# Patient Record
Sex: Male | Born: 1965
Health system: Southern US, Community
[De-identification: ages and names within clinical notes are randomized; demographics above are authoritative.]

## PROBLEM LIST (undated history)

## (undated) DIAGNOSIS — E785 Hyperlipidemia, unspecified: Secondary | ICD-10-CM

## (undated) DIAGNOSIS — R519 Headache, unspecified: Secondary | ICD-10-CM

## (undated) DIAGNOSIS — Z803 Family history of malignant neoplasm of breast: Secondary | ICD-10-CM

## (undated) DIAGNOSIS — Z801 Family history of malignant neoplasm of trachea, bronchus and lung: Secondary | ICD-10-CM

## (undated) DIAGNOSIS — R51 Headache: Secondary | ICD-10-CM

## (undated) DIAGNOSIS — M069 Rheumatoid arthritis, unspecified: Secondary | ICD-10-CM

## (undated) DIAGNOSIS — H539 Unspecified visual disturbance: Secondary | ICD-10-CM

## (undated) DIAGNOSIS — Z800 Family history of malignant neoplasm of digestive organs: Secondary | ICD-10-CM

## (undated) DIAGNOSIS — Z8481 Family history of carrier of genetic disease: Secondary | ICD-10-CM

## (undated) DIAGNOSIS — I10 Essential (primary) hypertension: Secondary | ICD-10-CM

## (undated) HISTORY — DX: Hyperlipidemia, unspecified: E78.5

## (undated) HISTORY — DX: Family history of malignant neoplasm of breast: Z80.3

## (undated) HISTORY — DX: Essential (primary) hypertension: I10

## (undated) HISTORY — PX: BACK SURGERY: SHX140

## (undated) HISTORY — PX: KNEE SURGERY: SHX244

## (undated) HISTORY — DX: Headache: R51

## (undated) HISTORY — DX: Family history of malignant neoplasm of trachea, bronchus and lung: Z80.1

## (undated) HISTORY — DX: Headache, unspecified: R51.9

## (undated) HISTORY — PX: CARPAL TUNNEL RELEASE: SHX101

## (undated) HISTORY — DX: Unspecified visual disturbance: H53.9

## (undated) HISTORY — DX: Family history of malignant neoplasm of digestive organs: Z80.0

## (undated) HISTORY — DX: Family history of carrier of genetic disease: Z84.81

---

## 2011-11-21 DIAGNOSIS — M549 Dorsalgia, unspecified: Secondary | ICD-10-CM

## 2011-11-21 DIAGNOSIS — E785 Hyperlipidemia, unspecified: Secondary | ICD-10-CM

## 2011-11-21 DIAGNOSIS — F411 Generalized anxiety disorder: Secondary | ICD-10-CM | POA: Insufficient documentation

## 2011-11-21 DIAGNOSIS — M109 Gout, unspecified: Secondary | ICD-10-CM

## 2011-11-21 DIAGNOSIS — G8929 Other chronic pain: Secondary | ICD-10-CM | POA: Insufficient documentation

## 2011-11-21 HISTORY — DX: Gout, unspecified: M10.9

## 2011-11-21 HISTORY — DX: Hyperlipidemia, unspecified: E78.5

## 2011-11-21 HISTORY — DX: Generalized anxiety disorder: F41.1

## 2011-11-21 HISTORY — DX: Dorsalgia, unspecified: M54.9

## 2013-04-19 DIAGNOSIS — N529 Male erectile dysfunction, unspecified: Secondary | ICD-10-CM

## 2013-04-19 HISTORY — DX: Male erectile dysfunction, unspecified: N52.9

## 2013-09-19 DIAGNOSIS — J45909 Unspecified asthma, uncomplicated: Secondary | ICD-10-CM

## 2013-09-19 HISTORY — DX: Unspecified asthma, uncomplicated: J45.909

## 2014-03-04 DIAGNOSIS — K219 Gastro-esophageal reflux disease without esophagitis: Secondary | ICD-10-CM

## 2014-03-04 DIAGNOSIS — F329 Major depressive disorder, single episode, unspecified: Secondary | ICD-10-CM | POA: Insufficient documentation

## 2014-03-04 HISTORY — DX: Gastro-esophageal reflux disease without esophagitis: K21.9

## 2014-03-04 HISTORY — DX: Major depressive disorder, single episode, unspecified: F32.9

## 2015-07-30 DIAGNOSIS — M1712 Unilateral primary osteoarthritis, left knee: Secondary | ICD-10-CM

## 2015-07-30 HISTORY — DX: Unilateral primary osteoarthritis, left knee: M17.12

## 2015-12-24 ENCOUNTER — Telehealth: Payer: Self-pay | Admitting: Behavioral Health

## 2015-12-24 NOTE — Telephone Encounter (Signed)
Attempted to reach patient at time of Pre-Visit Call. Unable to leave a message due to the patient's voice mailbox being full.

## 2015-12-25 ENCOUNTER — Ambulatory Visit (INDEPENDENT_AMBULATORY_CARE_PROVIDER_SITE_OTHER): Payer: BLUE CROSS/BLUE SHIELD | Admitting: Family Medicine

## 2015-12-25 ENCOUNTER — Encounter: Payer: Self-pay | Admitting: Family Medicine

## 2015-12-25 VITALS — BP 136/70 | HR 82 | Temp 98.2°F | Ht 67.0 in | Wt 174.0 lb

## 2015-12-25 DIAGNOSIS — Z1211 Encounter for screening for malignant neoplasm of colon: Secondary | ICD-10-CM | POA: Diagnosis not present

## 2015-12-25 DIAGNOSIS — G4700 Insomnia, unspecified: Secondary | ICD-10-CM

## 2015-12-25 DIAGNOSIS — E785 Hyperlipidemia, unspecified: Secondary | ICD-10-CM | POA: Diagnosis not present

## 2015-12-25 DIAGNOSIS — Z130 Encounter for screening for diseases of the blood and blood-forming organs and certain disorders involving the immune mechanism: Secondary | ICD-10-CM

## 2015-12-25 DIAGNOSIS — Z7189 Other specified counseling: Secondary | ICD-10-CM

## 2015-12-25 DIAGNOSIS — F17200 Nicotine dependence, unspecified, uncomplicated: Secondary | ICD-10-CM

## 2015-12-25 DIAGNOSIS — Z7689 Persons encountering health services in other specified circumstances: Secondary | ICD-10-CM

## 2015-12-25 DIAGNOSIS — Z131 Encounter for screening for diabetes mellitus: Secondary | ICD-10-CM | POA: Diagnosis not present

## 2015-12-25 HISTORY — DX: Insomnia, unspecified: G47.00

## 2015-12-25 HISTORY — DX: Hyperlipidemia, unspecified: E78.5

## 2015-12-25 HISTORY — DX: Nicotine dependence, unspecified, uncomplicated: F17.200

## 2015-12-25 LAB — COMPREHENSIVE METABOLIC PANEL
ALK PHOS: 123 U/L — AB (ref 39–117)
ALT: 20 U/L (ref 0–53)
AST: 17 U/L (ref 0–37)
Albumin: 4.3 g/dL (ref 3.5–5.2)
BILIRUBIN TOTAL: 0.2 mg/dL (ref 0.2–1.2)
BUN: 10 mg/dL (ref 6–23)
CO2: 25 meq/L (ref 19–32)
Calcium: 9 mg/dL (ref 8.4–10.5)
Chloride: 106 mEq/L (ref 96–112)
Creatinine, Ser: 1.07 mg/dL (ref 0.40–1.50)
GFR: 77.49 mL/min (ref >60.00)
GLUCOSE: 117 mg/dL — AB (ref 70–99)
Potassium: 4.4 mEq/L (ref 3.5–5.1)
Sodium: 137 mEq/L (ref 135–145)
TOTAL PROTEIN: 6.6 g/dL (ref 6.0–8.3)

## 2015-12-25 LAB — CBC
HCT: 38.9 % — ABNORMAL LOW (ref 39.0–52.0)
Hemoglobin: 13.2 g/dL (ref 13.0–17.0)
MCHC: 33.8 g/dL (ref 30.0–36.0)
MCV: 89.4 fl (ref 78.0–100.0)
Platelets: 244 10*3/uL (ref 150.0–400.0)
RBC: 4.35 Mil/uL (ref 4.22–5.81)
RDW: 13.4 % (ref 11.5–15.5)
WBC: 9.6 10*3/uL (ref 4.0–10.5)

## 2015-12-25 LAB — LIPID PANEL
CHOL/HDL RATIO: 4
CHOLESTEROL: 125 mg/dL (ref 0–200)
HDL: 32.3 mg/dL — ABNORMAL LOW (ref >39.00)
LDL CALC: 57 mg/dL (ref 0–99)
NONHDL: 92.93
Triglycerides: 181 mg/dL — ABNORMAL HIGH (ref 0.0–149.0)
VLDL: 36.2 mg/dL (ref 0.0–40.0)

## 2015-12-25 LAB — HEMOGLOBIN A1C: HEMOGLOBIN A1C: 5.3 % (ref 4.6–6.5)

## 2015-12-25 NOTE — Progress Notes (Signed)
Pre visit review using our clinic review tool, if applicable. No additional management support is needed unless otherwise documented below in the visit note. 

## 2015-12-25 NOTE — Patient Instructions (Signed)
I will be in touch with your labs asap If you like, set up your mychart account- this is the best way to communicate with me! Please keep an eye on your blood pressure- if you are consistently running higher than 135/90 please send me a message  I will refer you to the GI group in [place]  Please come and see me for a physical in the fall- Let me know if you need any refills in the meantime Please do work on cutting down/ quitting smoking

## 2015-12-25 NOTE — Progress Notes (Signed)
[facility] at [facility] [address], [address] [place], [place] [address] [phone]  Date:  12/25/2015   Name:  Timothy Mcintosh   DOB:  [birth year]   MRN:  [id number]  PCP:  Abbe Amsterdam, MD    Chief Complaint: New Patient (Initial Visit)   History of Present Illness:  Timothy Mcintosh is a 50 y.o. very pleasant male patient who presents with the following:  Here today as a new patient to establish care and see [place] for   He has llst 65 lbs over the last year- he has been exercising and changed his diet.  He has walked a lot on the treadmill  He has not had his BP medication in a couple of weeks and wonders if he still needs it.  He is taking much better care of himself  He was taking lisinpril 40 mg daily.  He does check his BP at the drug stores- about once a week.    He is a smoker still His MGF did have an MI at age 68.  HTN runs in his family as well as dyslipidemia.   His mother had breast cancer.   He did do a stress test 5-6 years ago.   He does not have any CP when he is exercisng He does have some OA.    He has not yet had a colonoscopy- he is willing to get this scheduled  He is due for labs today He declines a flu shot today  He does take trazodone at night- 100 mg- this is for insomnia There are no active problems to display for this patient.   No past medical history on file.  No past surgical history on file.  Social History  Substance Use Topics  . Smoking status: Current Every Day Smoker  . Smokeless tobacco: None  . Alcohol Use: No    No family history on file.  Not on File  Medication list has been reviewed and updated.  No current outpatient prescriptions on file prior to visit.   No current facility-administered medications on file prior to visit.    Review of Systems:  As per HPI- otherwise negative.   Physical Examination: Filed Vitals:   12/25/15 0830  BP: 136/70  Pulse: 82   Temp: 98.2 F (36.8 C)   Filed Vitals:   12/25/15 0830  Height: 5\' 7"  (1.702 m)  Weight: 174 lb (78.926 kg)   Body mass index is 27.25 kg/(m^2). Ideal Body Weight: Weight in (lb) to have BMI = 25: 159.3  GEN: WDWN, NAD, Non-toxic, A & O x 3, looks well HEENT: Atraumatic, Normocephalic. Neck supple. No masses, No LAD. Ears and Nose: No external deformity. CV: RRR, No M/G/R. No JVD. No thrill. No extra heart sounds. PULM: CTA B, no wheezes, crackles, rhonchi. No retractions. No resp. distress. No accessory muscle use. EXTR: No c/c/e NEURO Normal gait.  PSYCH: Normally interactive. Conversant. Not depressed or anxious appearing.  Calm demeanor.    Assessment and Plan: Establishing care with new doctor, encounter for  Dyslipidemia - Plan: Lipid panel  Screening for colon cancer - Plan: Ambulatory referral to Gastroenterology  Screening for deficiency anemia - Plan: CBC  Screening for diabetes mellitus - Plan: Comprehensive metabolic panel, Hemoglobin A1c  Tobacco use disorder  Insomnia  Establishing with as PCP today He is still taking his CHL medication but no longer taking BP medication His BP is quite reasonable off meds so  he may no longer need this He will watch his BP and let me know if running high- in that case will call in a lower lose of lisinopril for him Await labs Referral to GI  Signed Abbe Amsterdam, MD

## 2016-01-20 ENCOUNTER — Encounter: Payer: Self-pay | Admitting: Family Medicine

## 2016-01-21 MED ORDER — SIMVASTATIN 40 MG PO TABS: 40.0000 mg | ORAL_TABLET | Freq: Every day | ORAL | Status: DC

## 2016-02-04 DIAGNOSIS — Z1211 Encounter for screening for malignant neoplasm of colon: Secondary | ICD-10-CM | POA: Diagnosis not present

## 2016-02-04 DIAGNOSIS — Z8371 Family history of colonic polyps: Secondary | ICD-10-CM | POA: Diagnosis not present

## 2016-02-23 ENCOUNTER — Encounter: Payer: Self-pay | Admitting: Family Medicine

## 2016-02-24 MED ORDER — ALLOPURINOL 300 MG PO TABS: 300.0000 mg | ORAL_TABLET | Freq: Every day | ORAL | Status: DC

## 2016-02-24 MED ORDER — OMEPRAZOLE 40 MG PO CPDR: 40.0000 mg | DELAYED_RELEASE_CAPSULE | Freq: Every day | ORAL | Status: DC

## 2016-03-04 ENCOUNTER — Encounter: Payer: Self-pay | Admitting: Family Medicine

## 2016-03-04 ENCOUNTER — Ambulatory Visit (INDEPENDENT_AMBULATORY_CARE_PROVIDER_SITE_OTHER): Payer: BLUE CROSS/BLUE SHIELD | Admitting: Family Medicine

## 2016-03-04 ENCOUNTER — Telehealth: Payer: Self-pay

## 2016-03-04 ENCOUNTER — Ambulatory Visit (HOSPITAL_BASED_OUTPATIENT_CLINIC_OR_DEPARTMENT_OTHER)
Admission: RE | Admit: 2016-03-04 | Discharge: 2016-03-04 | Disposition: A | Discharge status: Home / Self Care | Payer: BLUE CROSS/BLUE SHIELD | Source: Ambulatory Visit | Attending: Family Medicine | Admitting: Family Medicine

## 2016-03-04 VITALS — BP 132/82 | HR 75 | Temp 98.6°F | Ht 67.5 in | Wt 177.0 lb

## 2016-03-04 DIAGNOSIS — M25561 Pain in right knee: Secondary | ICD-10-CM | POA: Insufficient documentation

## 2016-03-04 DIAGNOSIS — M2550 Pain in unspecified joint: Secondary | ICD-10-CM | POA: Diagnosis not present

## 2016-03-04 DIAGNOSIS — M25562 Pain in left knee: Secondary | ICD-10-CM

## 2016-03-04 DIAGNOSIS — M179 Osteoarthritis of knee, unspecified: Secondary | ICD-10-CM | POA: Diagnosis not present

## 2016-03-04 MED ORDER — TRAMADOL HCL 50 MG PO TABS: 50.0000 mg | ORAL_TABLET | Freq: Three times a day (TID) | ORAL | Status: DC | PRN

## 2016-03-04 NOTE — Progress Notes (Signed)
Pre visit review using our clinic tool,if applicable. No additional management support is needed unless otherwise documented below in the visit note.  

## 2016-03-04 NOTE — Patient Instructions (Signed)
Please go downstairs for x-rays.  You can then go home and I will be in touch with your results asap Please schedule a lab visit at your convenience to have some blood drawn in the next couple of weeks Try the tramadol as needed for pain- remember this can make you feel drowsy.  Combination with your trazodone may cause you to feel more sleepy- at first try taking just 1/2 of your trazodone until you see how it effects ytou

## 2016-03-04 NOTE — Progress Notes (Signed)
[name] at [facility] [address], [place], [place] [address] [phone]  Date:  03/04/2016   Name:  Timothy Mcintosh   DOB:  December 08, 1965   MRN:  [id number]  PCP:  Lamar Blinks, MD    Chief Complaint: Joint Pain   History of Present Illness:  Timothy Mcintosh is a 49 y.o. very pleasant male patient who presents with the following:  Seen by myself a little over than 2 months ago to establish care.  He mentioned mild OA at that visit.  Here today with complaint of worsening joint pains. He notes more pain in his shoulders, elbows, ankles and knees. They have been hurting more over the last several weeks. As long as he is busy during the day he does ok, but he has pain when he stops moving around at night.  He will actually feel like his bones are tender to touch.  He is taking diclofenac right now- however he is not sure how much it is helping.  When he ran out he did not notice much difference His sx got worse gradually- seemed to start around some weather changes so he did not think too much of it at first.   He is even waking up at night with the pain- his shoulders (both) will wake him up No fever, sweats, weight loss.   He did have a right knee scope in the past.   There is a family history of arthritis- OA and psoriatic arthritis.  No known family history of RA  Patient Active Problem List   Diagnosis Date Noted  . Dyslipidemia 12/25/2015  . Tobacco use disorder 12/25/2015  . Insomnia 12/25/2015    Past Medical History  Diagnosis Date  . Hyperlipidemia   . Hypertension     Past Surgical History  Procedure Laterality Date  . Back surgery Bilateral     c5 -c6  . Carpal tunnel release Right     Social History  Substance Use Topics  . Smoking status: Current Every Day Smoker  . Smokeless tobacco: None  . Alcohol Use: No    No family history on file.  No Known Allergies  Medication list has been reviewed and  updated.  Current Outpatient Prescriptions on File Prior to Visit  Medication Sig Dispense Refill  . allopurinol (ZYLOPRIM) 300 MG tablet Take 1 tablet (300 mg total) by mouth daily. 30 tablet 5  . Multiple Vitamin (MULTIVITAMIN) capsule Take by mouth.    Marland Kitchen omeprazole (PRILOSEC) 40 MG capsule Take 1 capsule (40 mg total) by mouth daily. 30 capsule 5  . Probiotic Product (ACIDOPHILUS/GOAT MILK) CAPS Take by mouth.    . simvastatin (ZOCOR) 40 MG tablet Take 1 tablet (40 mg total) by mouth daily. 90 tablet 3  . traZODone (DESYREL) 100 MG tablet      No current facility-administered medications on file prior to visit.    Review of Systems:  As per HPI- otherwise negative.   Physical Examination: Filed Vitals:   03/04/16 1709  BP: 132/82  Pulse: 75  Temp: 98.6 F (37 C)   Filed Vitals:   03/04/16 1709  Height: 5' 7.5" (1.715 m)  Weight: 177 lb (80.287 kg)   Body mass index is 27.3 kg/(m^2). Ideal Body Weight: Weight in (lb) to have BMI = 25: 161.7  GEN: WDWN, NAD, Non-toxic, A & O x 3.  Looks well, smoker HEENT: Atraumatic, Normocephalic. Neck supple. No masses, No LAD. Ears  and Nose: No external deformity. CV: RRR, No M/G/R. No JVD. No thrill. No extra heart sounds. PULM: CTA B, no wheezes, crackles, rhonchi. No retractions. No resp. distress. No accessory muscle use. ABD: S, NT, ND, +BS. No rebound. No HSM. EXTR: No c/c/e NEURO Normal gait.  PSYCH: Normally interactive. Conversant. Not depressed or anxious appearing.  Calm demeanor.  Full and normal ROM of the bilateral shoulders, elbows, knees and ankles. Some crepitus in the left ankle   Assessment and Plan: Joint pain - Plan: traMADol (ULTRAM) 50 MG tablet, Cyclic citrul peptide antibody, IgG, Hepatic function panel, Sed Rate (ESR), C-reactive protein, Rheumatoid Factor  Left knee pain - Plan: DG Knee 1-2 Views Left  Right knee pain - Plan: DG Knee 1-2 Views Right  Here today with worsening joint pains Noted  slightly elevated alk phos from last labs X-rays pending of knees He will come in for labs tomorrow May try tramadol for pain as needed- cautioned regarding sedation  Signed Lamar Blinks, MD

## 2016-03-04 NOTE — Telephone Encounter (Signed)
Called medication in to Pharmacy. Tramadol # 40 with ) refills.Marland Kitchen

## 2016-03-05 ENCOUNTER — Other Ambulatory Visit (INDEPENDENT_AMBULATORY_CARE_PROVIDER_SITE_OTHER): Payer: BLUE CROSS/BLUE SHIELD

## 2016-03-05 DIAGNOSIS — M2550 Pain in unspecified joint: Secondary | ICD-10-CM

## 2016-03-05 LAB — C-REACTIVE PROTEIN: CRP: 0.4 mg/dL — AB (ref 0.5–20.0)

## 2016-03-05 LAB — HEPATIC FUNCTION PANEL
ALBUMIN: 4.3 g/dL (ref 3.5–5.2)
ALT: 18 U/L (ref 0–53)
AST: 16 U/L (ref 0–37)
Alkaline Phosphatase: 110 U/L (ref 39–117)
Bilirubin, Direct: 0 mg/dL (ref 0.0–0.3)
Total Bilirubin: 0.3 mg/dL (ref 0.2–1.2)
Total Protein: 6.3 g/dL (ref 6.0–8.3)

## 2016-03-05 LAB — SEDIMENTATION RATE: SED RATE: 4 mm/h (ref 0–22)

## 2016-03-05 LAB — RHEUMATOID FACTOR: Rhuematoid fact SerPl-aCnc: <10 IU/mL (ref <=14)

## 2016-03-08 ENCOUNTER — Ambulatory Visit: Payer: Self-pay | Admitting: Family Medicine

## 2016-03-08 ENCOUNTER — Other Ambulatory Visit: Payer: Self-pay | Admitting: Family Medicine

## 2016-03-08 DIAGNOSIS — M2550 Pain in unspecified joint: Secondary | ICD-10-CM

## 2016-03-08 LAB — CYCLIC CITRUL PEPTIDE ANTIBODY, IGG: CYCLIC CITRULLIN PEPTIDE AB: 57 U — AB

## 2016-03-25 DIAGNOSIS — M2550 Pain in unspecified joint: Secondary | ICD-10-CM | POA: Diagnosis not present

## 2016-03-25 DIAGNOSIS — R5382 Chronic fatigue, unspecified: Secondary | ICD-10-CM | POA: Diagnosis not present

## 2016-04-01 ENCOUNTER — Other Ambulatory Visit: Payer: Self-pay | Admitting: Family Medicine

## 2016-04-01 ENCOUNTER — Encounter: Payer: Self-pay | Admitting: Family Medicine

## 2016-04-01 DIAGNOSIS — M069 Rheumatoid arthritis, unspecified: Secondary | ICD-10-CM | POA: Insufficient documentation

## 2016-04-01 DIAGNOSIS — I10 Essential (primary) hypertension: Secondary | ICD-10-CM

## 2016-04-01 MED ORDER — LISINOPRIL 20 MG PO TABS: ORAL_TABLET | ORAL | Status: DC

## 2016-05-05 DIAGNOSIS — R5382 Chronic fatigue, unspecified: Secondary | ICD-10-CM | POA: Diagnosis not present

## 2016-05-05 DIAGNOSIS — M0579 Rheumatoid arthritis with rheumatoid factor of multiple sites without organ or systems involvement: Secondary | ICD-10-CM | POA: Diagnosis not present

## 2016-05-05 DIAGNOSIS — M2550 Pain in unspecified joint: Secondary | ICD-10-CM | POA: Diagnosis not present

## 2016-05-21 ENCOUNTER — Encounter: Payer: Self-pay | Admitting: Family Medicine

## 2016-05-21 ENCOUNTER — Other Ambulatory Visit: Payer: Self-pay | Admitting: Emergency Medicine

## 2016-05-21 DIAGNOSIS — I10 Essential (primary) hypertension: Secondary | ICD-10-CM

## 2016-05-21 MED ORDER — LISINOPRIL 20 MG PO TABS: ORAL_TABLET | ORAL | Status: DC

## 2016-05-21 MED ORDER — ALLOPURINOL 300 MG PO TABS: 300.0000 mg | ORAL_TABLET | Freq: Every day | ORAL | Status: DC

## 2016-05-21 NOTE — Telephone Encounter (Signed)
Spoke to pt and he states that his rheumatologist gave him the folic acid. He will contact them for the folic acid or get it otc. I will send refills in for Allopurinol and Lisinopril. Pt's wife will come by the office to pick up syringes to last pt until his next refill.

## 2016-05-21 NOTE — Telephone Encounter (Signed)
Received refill request from pt for Allopurinol, lisinopril and folic acid. Folic acid is not currently on pt's med list. Is it ok to order folic acid and if so what dosage or should the pt just purchase the folic acid otc. Thanks.

## 2016-06-15 ENCOUNTER — Encounter: Payer: Self-pay | Admitting: Family Medicine

## 2016-06-16 MED ORDER — TRAZODONE HCL 100 MG PO TABS: 100.0000 mg | ORAL_TABLET | Freq: Every evening | ORAL | 3 refills | Status: DC | PRN

## 2016-06-21 DIAGNOSIS — R5382 Chronic fatigue, unspecified: Secondary | ICD-10-CM | POA: Diagnosis not present

## 2016-06-21 DIAGNOSIS — M0579 Rheumatoid arthritis with rheumatoid factor of multiple sites without organ or systems involvement: Secondary | ICD-10-CM | POA: Diagnosis not present

## 2016-06-21 DIAGNOSIS — M2550 Pain in unspecified joint: Secondary | ICD-10-CM | POA: Diagnosis not present

## 2016-06-23 ENCOUNTER — Ambulatory Visit (INDEPENDENT_AMBULATORY_CARE_PROVIDER_SITE_OTHER): Payer: BLUE CROSS/BLUE SHIELD | Admitting: Family Medicine

## 2016-06-23 ENCOUNTER — Encounter: Payer: Self-pay | Admitting: Family Medicine

## 2016-06-23 VITALS — BP 145/70 | HR 98 | Temp 98.7°F | Wt 172.0 lb

## 2016-06-23 DIAGNOSIS — F17200 Nicotine dependence, unspecified, uncomplicated: Secondary | ICD-10-CM | POA: Diagnosis not present

## 2016-06-23 DIAGNOSIS — I10 Essential (primary) hypertension: Secondary | ICD-10-CM

## 2016-06-23 DIAGNOSIS — J209 Acute bronchitis, unspecified: Secondary | ICD-10-CM

## 2016-06-23 DIAGNOSIS — M069 Rheumatoid arthritis, unspecified: Secondary | ICD-10-CM

## 2016-06-23 MED ORDER — CEFDINIR 300 MG PO CAPS: 300.0000 mg | ORAL_CAPSULE | Freq: Two times a day (BID) | ORAL | 0 refills | Status: DC

## 2016-06-23 MED ORDER — VARENICLINE TARTRATE 1 MG PO TABS: 1.0000 mg | ORAL_TABLET | Freq: Two times a day (BID) | ORAL | 3 refills | Status: DC

## 2016-06-23 MED ORDER — VARENICLINE TARTRATE 0.5 MG X 11 & 1 MG X 42 PO MISC: ORAL | 0 refills | Status: DC

## 2016-06-23 NOTE — Patient Instructions (Signed)
It was very nice to see you today!   Your BP looked better on recheck- continue to keep an eye on it, but it may be a little bit high today due to prednisone Use the chantix as directed; there is a website with good info and maybe a savings card for you.  Choose your quit date, and start the medication a week prior to that date  Use the omnicef antibiotic for your cough- take it twice daily for 10 days.    Check with your rheumatologist about getting a flu shot this year

## 2016-06-23 NOTE — Progress Notes (Signed)
[facility] at [facility] [address] [place], [place] [address] [phone]  Date:  06/23/2016   Name:  Timothy Mcintosh   DOB:  11-Nov-1965   MRN:  [id number]  PCP:  Abbe Amsterdam, MD    Chief Complaint: Cough (Several weeks of coughing, Wants to quit smoking)   History of Present Illness:  Timothy Mcintosh is a 50 y.o. very pleasant male patient who presents with the following:  Here today for a recheck - we started on BP medication in the spring.  He is taking lisinopril without any SE, he is monitoring his BP at home He is here today for a cough that has been persistent for about 4-5 weeks.  The cough can be productive; he is bringing up some dark phlegm.  Not feeling SOB.  He has not noted any wheezing, no fever.    He would like to try quit smoking.  In the past he has tried wellbutrin- it did work well for him, he had much decreased cravings. However after a couple of weeks it made it hard for him to sleep, he already has trouble sleeping. He stopped using wellbutrin at that time but would be willing to try this or another med to quit  He has been smoking for about 30 years, about 3/4 ppd.   We discussed various methods of quitting including cutting down, cold [place], medications,  He is most interested in using chantix. Discussed how to pick a quit date and use the starter and continuation packs  He does have recently dx RA; he is seeing Dr. Dierdre Forth and Marcelino Duster, one of his PAs.  Currently on methotrexate and they are adding Hurima soon.  They did TB testing for him and results are pending He is also on prednisone right now.  He is using a 4 weeks taper , currently on 20 mg of pred. This is for a recent flare of his RA symptoms.    BP Readings from Last 3 Encounters:  06/23/16 (!) 157/49  03/04/16 132/82  12/25/15 136/70   His home BP was 118/68 at home last weekend. He is taking 20 mg of lisinopril right now.  He has been on  this for a few months so we do not think this is the cause of his cough  He does use trazodone for sleep; he is sometimes up with his RA pain.    Patient Active Problem List   Diagnosis Date Noted  . Rheumatoid arthritis (HCC) 04/01/2016  . Dyslipidemia 12/25/2015  . Tobacco use disorder 12/25/2015  . Insomnia 12/25/2015    Past Medical History:  Diagnosis Date  . Hyperlipidemia   . Hypertension     Past Surgical History:  Procedure Laterality Date  . BACK SURGERY Bilateral    c5 -c6  . CARPAL TUNNEL RELEASE Right     Social History  Substance Use Topics  . Smoking status: Current Every Day Smoker  . Smokeless tobacco: Not on file  . Alcohol use No    No family history on file.  No Known Allergies  Medication list has been reviewed and updated.  Current Outpatient Prescriptions on File Prior to Visit  Medication Sig Dispense Refill  . allopurinol (ZYLOPRIM) 300 MG tablet Take 1 tablet (300 mg total) by mouth daily. 30 tablet 5  . lisinopril (PRINIVIL,ZESTRIL) 20 MG tablet Start with a 1/2 tablet and increase to a whole tablet if needed 30 tablet 6  .  Multiple Vitamin (MULTIVITAMIN) capsule Take by mouth.    Marland Kitchen omeprazole (PRILOSEC) 40 MG capsule Take 1 capsule (40 mg total) by mouth daily. 30 capsule 5  . Probiotic Product (ACIDOPHILUS/GOAT MILK) CAPS Take by mouth.    . simvastatin (ZOCOR) 40 MG tablet Take 1 tablet (40 mg total) by mouth daily. 90 tablet 3  . traMADol (ULTRAM) 50 MG tablet Take 1 tablet (50 mg total) by mouth every 8 (eight) hours as needed. 40 tablet 0  . traZODone (DESYREL) 100 MG tablet Take 1 tablet (100 mg total) by mouth at bedtime as needed for sleep. 90 tablet 3   No current facility-administered medications on file prior to visit.     Review of Systems:  As per HPI- otherwise negative.   Physical Examination: Vitals:   06/23/16 0853  BP: (!) 157/49  Pulse: 98  Temp: 98.7 F (37.1 C)   Vitals:   06/23/16 0853  Weight: 172 lb  (78 kg)   Body mass index is 26.54 kg/m. Ideal Body Weight:    GEN: WDWN, NAD, Non-toxic, A & O x 3, looks well, normal weight HEENT: Atraumatic, Normocephalic. Neck supple. No masses, No LAD.  Bilateral TM wnl, oropharynx normal.  PEERL,EOMI.   Ears and Nose: No external deformity. CV: RRR, No M/G/R. No JVD. No thrill. No extra heart sounds. PULM: CTA B, no wheezes, crackles, rhonchi. No retractions. No resp. distress. No accessory muscle use. ABD: S, NT, ND EXTR: No c/c/e NEURO Normal gait.  PSYCH: Normally interactive. Conversant. Not depressed or anxious appearing.  Calm demeanor.    Assessment and Plan: Acute bronchitis, unspecified organism - Plan: cefdinir (OMNICEF) 300 MG capsule  Tobacco use disorder - Plan: varenicline (CHANTIX STARTING MONTH PAK) 0.5 MG X 11 & 1 MG X 42 tablet, varenicline (CHANTIX CONTINUING MONTH PAK) 1 MG tablet  Rheumatoid arthritis flare (HCC)  Essential hypertension chose omnicef as it does not interact with methotrexate. He will let me know if cough does not resolve He will continue to monitor his BP at home Start chatix as directed  It was very nice to see you today!   Your BP looked better on recheck- continue to keep an eye on it, but it may be a little bit high today due to prednisone Use the chantix as directed; there is a website with good info and maybe a savings card for you.  Choose your quit date, and start the medication a week prior to that date  Use the omnicef antibiotic for your cough- take it twice daily for 10 days.    Check with your rheumatologist about getting a flu shot this year   Signed Abbe Amsterdam, MD

## 2016-06-23 NOTE — Progress Notes (Signed)
Pre visit review using our clinic review tool, if applicable. No additional management support is needed unless otherwise documented below in the visit note. 

## 2016-08-20 ENCOUNTER — Other Ambulatory Visit: Payer: Self-pay | Admitting: Family Medicine

## 2016-08-20 DIAGNOSIS — I10 Essential (primary) hypertension: Secondary | ICD-10-CM

## 2016-08-23 DIAGNOSIS — M2550 Pain in unspecified joint: Secondary | ICD-10-CM | POA: Diagnosis not present

## 2016-08-23 DIAGNOSIS — M1A09X0 Idiopathic chronic gout, multiple sites, without tophus (tophi): Secondary | ICD-10-CM | POA: Diagnosis not present

## 2016-08-23 DIAGNOSIS — M0579 Rheumatoid arthritis with rheumatoid factor of multiple sites without organ or systems involvement: Secondary | ICD-10-CM | POA: Diagnosis not present

## 2016-08-30 MED ORDER — LISINOPRIL 20 MG PO TABS: ORAL_TABLET | ORAL | 6 refills | Status: DC

## 2016-08-30 NOTE — Telephone Encounter (Signed)
Called him back- he has noted his BP running high for the last couple of weeks He took chantix and also had recently used a course of pred BP last week at MD office- rheum- 167/98 No CP or SOB He is taking 2 mg of lisinopril. Will increase to 30 mg, he can go to 40 if necessary. He will continue to monitor his BP and will keep me posted   BP Readings from Last 3 Encounters:  06/23/16 (!) 145/70  03/04/16 132/82  12/25/15 136/70

## 2016-08-30 NOTE — Telephone Encounter (Signed)
-----   Message from [place], [place] sent at 08/30/2016 11:34 AM EDT ----- Regarding: Bllod pressure concerns Patient taking Lisinopril 20mg  po qd. Pt states his pressure has been up 160/100 today and has been as high as 180/100. Would like a phone call [phone]

## 2016-09-03 ENCOUNTER — Encounter: Payer: Self-pay | Admitting: Medical

## 2016-09-03 ENCOUNTER — Telehealth: Payer: Self-pay | Admitting: *Deleted

## 2016-09-03 ENCOUNTER — Observation Stay (HOSPITAL_BASED_OUTPATIENT_CLINIC_OR_DEPARTMENT_OTHER)
Admission: EM | Admit: 2016-09-03 | Discharge: 2016-09-04 | Disposition: A | Discharge status: Home / Self Care | Payer: BLUE CROSS/BLUE SHIELD | Attending: Internal Medicine | Admitting: Internal Medicine

## 2016-09-03 ENCOUNTER — Emergency Department (HOSPITAL_BASED_OUTPATIENT_CLINIC_OR_DEPARTMENT_OTHER): Payer: BLUE CROSS/BLUE SHIELD

## 2016-09-03 ENCOUNTER — Encounter (HOSPITAL_BASED_OUTPATIENT_CLINIC_OR_DEPARTMENT_OTHER): Payer: Self-pay | Admitting: *Deleted

## 2016-09-03 ENCOUNTER — Ambulatory Visit (INDEPENDENT_AMBULATORY_CARE_PROVIDER_SITE_OTHER): Payer: BLUE CROSS/BLUE SHIELD | Admitting: Medical

## 2016-09-03 VITALS — BP 174/90 | HR 87 | Temp 98.1°F | Ht 67.5 in | Wt 171.8 lb

## 2016-09-03 DIAGNOSIS — L749 Eccrine sweat disorder, unspecified: Secondary | ICD-10-CM

## 2016-09-03 DIAGNOSIS — Z8249 Family history of ischemic heart disease and other diseases of the circulatory system: Secondary | ICD-10-CM

## 2016-09-03 DIAGNOSIS — R0789 Other chest pain: Principal | ICD-10-CM | POA: Insufficient documentation

## 2016-09-03 DIAGNOSIS — M069 Rheumatoid arthritis, unspecified: Secondary | ICD-10-CM | POA: Diagnosis not present

## 2016-09-03 DIAGNOSIS — Z87891 Personal history of nicotine dependence: Secondary | ICD-10-CM

## 2016-09-03 DIAGNOSIS — G8929 Other chronic pain: Secondary | ICD-10-CM

## 2016-09-03 DIAGNOSIS — I10 Essential (primary) hypertension: Secondary | ICD-10-CM | POA: Diagnosis not present

## 2016-09-03 DIAGNOSIS — M546 Pain in thoracic spine: Secondary | ICD-10-CM

## 2016-09-03 DIAGNOSIS — F17210 Nicotine dependence, cigarettes, uncomplicated: Secondary | ICD-10-CM | POA: Diagnosis not present

## 2016-09-03 DIAGNOSIS — E785 Hyperlipidemia, unspecified: Secondary | ICD-10-CM | POA: Insufficient documentation

## 2016-09-03 DIAGNOSIS — R079 Chest pain, unspecified: Secondary | ICD-10-CM | POA: Diagnosis not present

## 2016-09-03 DIAGNOSIS — R05 Cough: Secondary | ICD-10-CM | POA: Diagnosis not present

## 2016-09-03 LAB — BASIC METABOLIC PANEL
Anion gap: 8 (ref 5–15)
BUN: 18 mg/dL (ref 6–20)
CHLORIDE: 106 mmol/L (ref 101–111)
CO2: 25 mmol/L (ref 22–32)
CREATININE: 0.76 mg/dL (ref 0.61–1.24)
Calcium: 9.6 mg/dL (ref 8.9–10.3)
GFR calc Af Amer: >60 mL/min (ref >60)
GFR calc non Af Amer: >60 mL/min (ref >60)
Glucose, Bld: 101 mg/dL — ABNORMAL HIGH (ref 65–99)
Potassium: 3.7 mmol/L (ref 3.5–5.1)
SODIUM: 139 mmol/L (ref 135–145)

## 2016-09-03 LAB — CBC
HCT: 43.9 % (ref 39.0–52.0)
Hemoglobin: 15 g/dL (ref 13.0–17.0)
MCH: 32.2 pg (ref 26.0–34.0)
MCHC: 34.2 g/dL (ref 30.0–36.0)
MCV: 94.2 fL (ref 78.0–100.0)
PLATELETS: 346 10*3/uL (ref 150–400)
RBC: 4.66 MIL/uL (ref 4.22–5.81)
RDW: 13.3 % (ref 11.5–15.5)
WBC: 12.6 10*3/uL — AB (ref 4.0–10.5)

## 2016-09-03 LAB — TROPONIN I
Troponin I: <0.03 ng/mL (ref <0.03)
Troponin I: <0.03 ng/mL (ref <0.03)
Troponin I: <0.03 ng/mL (ref <0.03)

## 2016-09-03 MED ORDER — ENOXAPARIN SODIUM 40 MG/0.4ML ~~LOC~~ SOLN
40.0000 mg | SUBCUTANEOUS | Status: DC
  Administered 2016-09-03: 40 mg via SUBCUTANEOUS
  Filled 2016-09-03: qty 0.4

## 2016-09-03 MED ORDER — NITROGLYCERIN 0.4 MG SL SUBL
0.4000 mg | SUBLINGUAL_TABLET | SUBLINGUAL | Status: DC | PRN
Start: 2016-09-03 — End: 2016-09-04
  Administered 2016-09-03: 0.4 mg via SUBLINGUAL
  Filled 2016-09-03: qty 1

## 2016-09-03 MED ORDER — ASPIRIN 81 MG PO CHEW
324.0000 mg | CHEWABLE_TABLET | Freq: Once | ORAL | Status: AC
  Administered 2016-09-03: 324 mg via ORAL
  Filled 2016-09-03: qty 4

## 2016-09-03 MED ORDER — INFLUENZA VAC SPLIT QUAD 0.5 ML IM SUSY
0.5000 mL | PREFILLED_SYRINGE | INTRAMUSCULAR | Status: AC
  Administered 2016-09-04: 0.5 mL via INTRAMUSCULAR
  Filled 2016-09-03: qty 0.5

## 2016-09-03 MED ORDER — LISINOPRIL 40 MG PO TABS
40.0000 mg | ORAL_TABLET | Freq: Every day | ORAL | Status: DC
  Administered 2016-09-04: 40 mg via ORAL
  Filled 2016-09-03: qty 1

## 2016-09-03 MED ORDER — ACETAMINOPHEN 325 MG PO TABS
650.0000 mg | ORAL_TABLET | Freq: Once | ORAL | Status: AC
  Administered 2016-09-03: 650 mg via ORAL
  Filled 2016-09-03: qty 2

## 2016-09-03 MED ORDER — ACETAMINOPHEN 325 MG PO TABS
650.0000 mg | ORAL_TABLET | ORAL | Status: DC | PRN
  Administered 2016-09-03 – 2016-09-04 (×3): 650 mg via ORAL
  Filled 2016-09-03 (×3): qty 2

## 2016-09-03 MED ORDER — ALLOPURINOL 300 MG PO TABS
300.0000 mg | ORAL_TABLET | Freq: Every day | ORAL | Status: DC
  Administered 2016-09-04: 300 mg via ORAL
  Filled 2016-09-03: qty 1

## 2016-09-03 MED ORDER — MORPHINE SULFATE (PF) 2 MG/ML IV SOLN: 2.0000 mg | INTRAVENOUS | Status: DC | PRN

## 2016-09-03 MED ORDER — TRAZODONE HCL 100 MG PO TABS
100.0000 mg | ORAL_TABLET | Freq: Every evening | ORAL | Status: DC | PRN
Start: 2016-09-03 — End: 2016-09-04
  Administered 2016-09-03: 100 mg via ORAL
  Filled 2016-09-03: qty 1

## 2016-09-03 MED ORDER — PANTOPRAZOLE SODIUM 40 MG PO TBEC
80.0000 mg | DELAYED_RELEASE_TABLET | Freq: Every day | ORAL | Status: DC
  Administered 2016-09-04: 80 mg via ORAL
  Filled 2016-09-03: qty 2

## 2016-09-03 MED ORDER — ONDANSETRON HCL 4 MG/2ML IJ SOLN: 4.0000 mg | Freq: Four times a day (QID) | INTRAMUSCULAR | Status: DC | PRN

## 2016-09-03 MED ORDER — PNEUMOCOCCAL VAC POLYVALENT 25 MCG/0.5ML IJ INJ
0.5000 mL | INJECTION | INTRAMUSCULAR | Status: AC
  Administered 2016-09-04: 0.5 mL via INTRAMUSCULAR
  Filled 2016-09-03: qty 0.5

## 2016-09-03 MED ORDER — SIMVASTATIN 40 MG PO TABS: 40.0000 mg | ORAL_TABLET | Freq: Every day | ORAL | Status: DC

## 2016-09-03 NOTE — ED Notes (Signed)
EDP advised hold any more NTG due to pain increasing.

## 2016-09-03 NOTE — Progress Notes (Signed)
Pre visit review using our clinic review tool, if applicable. No additional management support is needed unless otherwise documented below in the visit note. 

## 2016-09-03 NOTE — ED Notes (Signed)
Pt on cardiac monitor and automatic VS 

## 2016-09-03 NOTE — Patient Instructions (Signed)
With your intermittent chest pressure over, intermittent shortness of breath over past 2 weeks and  faint pressure today, I do think best to be seen in the ED now. ED can also evaluate your moderate to severe HA.  Your current high bp, history of htn, history  of smoking, high cholesterol and family history of MI are risk factors that make ED evaluation necessary.  I have talked with the ED physician and notified him of your presentation today.  They will make decision on bp med adjustment/additional med.  Follow up date to be determined by ED.

## 2016-09-03 NOTE — ED Notes (Signed)
Pt reports ongoing chest pain x several weeks, non radiating, describes as squeezing. Pt denies N/v, SOB. Pt states he attributed it to multiple medication changes for RA and also starting chantix to quit smoking. Pt states his BP was elevated today so he thought he should be checked.

## 2016-09-03 NOTE — ED Triage Notes (Signed)
Tightness in his chest for a couple of weeks. He had an EKG in his doctors office which was NSR per pt. He was also hypertensive in the office. He was told to come to the ED for further evaluation.

## 2016-09-03 NOTE — ED Provider Notes (Signed)
[facility] Provider Note   CSN: [id number] Arrival date & time: 09/03/16  1201     History   Chief Complaint Chief Complaint  Patient presents with  . Chest Pain  . Hypertension    HPI Timothy Mcintosh is a 50 y.o. male with a past medical history significant for hypertension and hyperlipidemia who presents from his PCP for chest pain. Patient reports that for the last week, he has had intermittent symptoms which became acutely worsened this morning and constant. His symptoms include pressure-like chest pain in his left chest. He describes as radiating to his left shoulder, associated with diaphoresis, lightheadedness, shortness of breath, and palpitations. He denies nausea or vomiting. He denies any fevers, chills, or cough. He denies any history of DVT or PE. Next  He reports that he smokes and has a strong family history of heart disease.    The history is provided by the patient, a caregiver and medical records. No language interpreter was used.  Chest Pain   This is a new problem. The current episode started more than 1 week ago. The problem occurs constantly. The problem has been gradually worsening. The pain is associated with exertion. The pain is present in the lateral region and substernal region. The pain is at a severity of 9/10. The pain is severe. The quality of the pain is described as exertional and pressure-like. The pain radiates to the left shoulder. The symptoms are aggravated by exertion. Associated symptoms include diaphoresis, exertional chest pressure, palpitations and shortness of breath. Pertinent negatives include no abdominal pain, no back pain, no cough, no dizziness, no fever, no headaches, no lower extremity edema, no nausea, no numbness, no syncope, no vomiting and no weakness. He has tried nothing for the symptoms. The treatment provided no relief. Risk factors include male gender and smoking/tobacco exposure.  His past medical history is  significant for hyperlipidemia and hypertension.  His family medical history is significant for heart disease and early MI.    Past Medical History:  Diagnosis Date  . Hyperlipidemia   . Hypertension     Patient Active Problem List   Diagnosis Date Noted  . Rheumatoid arthritis (HCC) 04/01/2016  . Dyslipidemia 12/25/2015  . Tobacco use disorder 12/25/2015  . Insomnia 12/25/2015    Past Surgical History:  Procedure Laterality Date  . BACK SURGERY Bilateral    c5 -c6  . CARPAL TUNNEL RELEASE Right        Home Medications    Prior to Admission medications   Medication Sig Start Date End Date Taking? Authorizing Provider  Adalimumab (HUMIRA [place]) Inject into the skin every 14 (fourteen) days.    Historical Provider, MD  allopurinol (ZYLOPRIM) 300 MG tablet Take 1 tablet (300 mg total) by mouth daily. 05/21/16   Gwenlyn Found Copland, MD  cefdinir (OMNICEF) 300 MG capsule Take 1 capsule (300 mg total) by mouth 2 (two) times daily. 06/23/16   Gwenlyn Found Copland, MD  lisinopril (PRINIVIL,ZESTRIL) 20 MG tablet Take 30 mg (1.5 tablets) daily for high blood pressure. May increase to 2 tablets if needed Patient taking differently: Take 40 mg by mouth daily. Take 30 mg (1.5 tablets) daily for high blood pressure. May increase to 2 tablets if needed 08/30/16   Pearline Cables, MD  methotrexate (50 MG/ML) 1 g injection Inject into the vein once a week.    Historical Provider, MD  Multiple Vitamin (MULTIVITAMIN) capsule Take by mouth.    Historical Provider, MD  omeprazole (  PRILOSEC) 40 MG capsule TAKE 1 CAPSULE BY MOUTH DAILY 08/20/16   Pearline Cables, MD  PREDNISONE PO Take by mouth. Taper per Dr. Dierdre Forth, rheumatology    Historical Provider, MD  Probiotic Product (ACIDOPHILUS/GOAT MILK) CAPS Take by mouth.    Historical Provider, MD  simvastatin (ZOCOR) 40 MG tablet Take 1 tablet (40 mg total) by mouth daily. 01/21/16   Pearline Cables, MD  traZODone (DESYREL) 100 MG tablet Take 1 tablet  (100 mg total) by mouth at bedtime as needed for sleep. 06/16/16   Pearline Cables, MD  varenicline (CHANTIX CONTINUING MONTH PAK) 1 MG tablet Take 1 tablet (1 mg total) by mouth 2 (two) times daily. 06/23/16   Gwenlyn Found Copland, MD  varenicline (CHANTIX STARTING MONTH PAK) 0.5 MG X 11 & 1 MG X 42 tablet Take one 0.5 mg tablet by mouth once daily for 3 days, then increase to one 0.5 mg tablet twice daily for 4 days, then increase to one 1 mg tablet twice daily. 06/23/16   Pearline Cables, MD    Family History No family history on file.  Social History Social History  Substance Use Topics  . Smoking status: Current Every Day Smoker  . Smokeless tobacco: Never Used  . Alcohol use No     Allergies   Review of patient's allergies indicates no known allergies.   Review of Systems Review of Systems  Constitutional: Positive for diaphoresis. Negative for activity change, appetite change, chills, fatigue and fever.  HENT: Negative for congestion and rhinorrhea.   Eyes: Negative for visual disturbance.  Respiratory: Positive for shortness of breath. Negative for cough, chest tightness, wheezing and stridor.   Cardiovascular: Positive for chest pain and palpitations. Negative for leg swelling and syncope.  Gastrointestinal: Negative for abdominal distention, abdominal pain, blood in stool, constipation, diarrhea, nausea and vomiting.  Genitourinary: Negative for difficulty urinating, dysuria, flank pain and frequency.  Musculoskeletal: Negative for back pain, gait problem and neck pain.  Skin: Negative for rash and wound.  Neurological: Positive for light-headedness. Negative for dizziness, weakness, numbness and headaches.  Psychiatric/Behavioral: Negative for agitation.  All other systems reviewed and are negative.    Physical Exam Updated Vital Signs BP 141/98   Pulse 91   Temp 98.6 F (37 C) (Oral)   Resp 14   Ht 5' 7.5" (1.715 m)   Wt 171 lb (77.6 kg)   SpO2 100%   BMI  26.39 kg/m   Physical Exam  Constitutional: He is oriented to person, place, and time. He appears well-developed and well-nourished. No distress.  HENT:  Head: Normocephalic and atraumatic.  Mouth/Throat: Oropharynx is clear and moist.  Eyes: Conjunctivae and EOM are normal.  Neck: Normal range of motion. Neck supple.  Cardiovascular: Normal rate and regular rhythm.   No murmur heard. Pulmonary/Chest: Effort normal and breath sounds normal. No respiratory distress. He has no wheezes. He has no rales. He exhibits no tenderness.  Abdominal: Soft. There is no tenderness.  Musculoskeletal: He exhibits no edema or tenderness.  Neurological: He is alert and oriented to person, place, and time. He displays normal reflexes. No cranial nerve deficit. He exhibits normal muscle tone. Coordination normal.  Skin: Skin is warm. Capillary refill takes less than 2 seconds. He is diaphoretic. No pallor.  Psychiatric: He has a normal mood and affect.  Nursing note and vitals reviewed.    ED Treatments / Results  Labs (all labs ordered are listed, but only abnormal results are  displayed) Labs Reviewed  BASIC METABOLIC PANEL - Abnormal; Notable for the following:       Result Value   Glucose, Bld 101 (*)    All other components within normal limits  CBC - Abnormal; Notable for the following:    WBC 12.6 (*)    All other components within normal limits  TROPONIN I  TROPONIN I    EKG  EKG Interpretation  Date/Time:  Friday September 03 2016 12:10:49 EDT Ventricular Rate:  78 PR Interval:  114 QRS Duration: 84 QT Interval:  374 QTC Calculation: 426 R Axis:   72 Text Interpretation:  Normal sinus rhythm Minimal voltage criteria for LVH, may be normal variant Borderline ECG No STEMI Confirmed by Rush Landmark MD, CHRISTOPHER [phone]) on 09/03/2016 4:29:12 PM       Radiology Dg Chest 2 View  Result Date: 09/03/2016 CLINICAL DATA:  Chest tightness and cough for 4 weeks EXAM: CHEST  2 VIEW  COMPARISON:  None. FINDINGS: The heart size and mediastinal contours are within normal limits. Both lungs are clear. The visualized skeletal structures are unremarkable. IMPRESSION: No active cardiopulmonary disease. Electronically Signed   By: Elige Ko   On: 09/03/2016 13:24    Procedures Procedures (including critical care time)  Medications Ordered in ED Medications  nitroGLYCERIN (NITROSTAT) SL tablet 0.4 mg (0.4 mg Sublingual Given 09/03/16 1354)  aspirin chewable tablet 324 mg (324 mg Oral Given 09/03/16 1354)  acetaminophen (TYLENOL) tablet 650 mg (650 mg Oral Given 09/03/16 1404)     Initial Impression / Assessment and Plan / ED Course  I have reviewed the triage vital signs and the nursing notes.  Pertinent labs & imaging results that were available during my care of the patient were reviewed by me and considered in my medical decision making (see chart for details).  Clinical Course    Kellis Bearman is a 50 y.o. male with a past medical history significant for hypertension and hyperlipidemia who presents from his PCP for chest pain.  History and exam are seen above.  On exam, patient had clear lungs, chest nontender, abdomen nontender. Symmetric pulses and neuro exam intact. Patient had mild diaphoresis on exam.  Based on patient's complaint of ongoing chest pain and shortness of breath, patient will be worked up with EKG, chest x-ray, and laboratory testing. Pharmacy was called and they confirmed that it was appropriate to provide aspirin and nitro despite patient's home medications.  Patient reported onset of headache after nitroglycerin and mild chest pain relief. Patient given Tylenol for headache.  Laboratory testing showed negative troponin 2, and laboratory testing otherwise is significant for mild leukocytosis. Chest x-ray remarkable.  Heart score calculated for patient and was a 6. Conversation was held explaining his risk of major first cardiac event index 30  days. Due to elevated risk, and the severity of pain patient experienced earlier, decision was made to admit the patient for observation and further chest pain management.  Hospitalist team was called and he agreed with admission. Patient will be admitted to hospitalist service for further management.   Final Clinical Impressions(s) / ED Diagnoses   Final diagnoses:  Chest pain, unspecified type     Clinical Impression: 1. Chest pain, unspecified type     Disposition: Admit to Hospitalist service    Heide Scales, MD 09/03/16 [phone]

## 2016-09-03 NOTE — ED Notes (Signed)
ED Provider at bedside. 

## 2016-09-03 NOTE — Telephone Encounter (Signed)
Pt walked into clinic reporting home BP of 185/110. He has h/o HTN, currently taking lisinopril 40 mg daily. Last dose around 0530 today. The pt is anxious, speaking rapidly and tapping foot, stating "I don't know if this is just in my head. I get myself really worked up." He has had ongoing BP issues and his lisinopril was increased earlier this week by PCP. He denies weakness, numbness, and dizziness. Endorses HA x3 days and tightness in left chest under breast that comes and goes. Endorses some baseline SOB that is unchanged from previous. Pt scheduled for acute appt w/ Esperanza Richters, PA-C at 10:30am today and encouraged to wait in waiting room until appt. 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 He agrees to call 911 in the interim if any worsening symptoms.    Vital Signs: BP: 176/88 P: 89 O2: 99 RR: 22

## 2016-09-03 NOTE — H&P (Signed)
History and Physical    Timothy Mcintosh BRA:[id number] DOB: [birth year] DOA: 09/03/2016   PCP: Abbe Amsterdam, MD Chief Complaint:  Chief Complaint  Patient presents with  . Chest Pain  . Hypertension    HPI: Timothy Mcintosh is a 50 y.o. male with medical history significant of HTN, HLD, smoking.  Patient presents tot he ED at [facility] with c/o intermittent chest tightness for past couple of weeks.  Associated with HTN worse than baseline recently.  Symptoms became acutely worse this morning and constant.  Quality is "pressure like", located in left chest, radiates to shoulder.  Associated with SOB, palpitations, diaphoresis.  ED Course: Trop negative x2, EKG NSR, borderline for LVH.  Review of Systems: As per HPI otherwise 10 point review of systems negative.    Past Medical History:  Diagnosis Date  . Hyperlipidemia   . Hypertension     Past Surgical History:  Procedure Laterality Date  . BACK SURGERY Bilateral    c5 -c6  . CARPAL TUNNEL RELEASE Right      reports that he has been smoking.  He has never used smokeless tobacco. He reports that he does not drink alcohol or use drugs.  No Known Allergies  Family History  Problem Relation Age of Onset  . High Cholesterol Father   . High Cholesterol Brother   . Heart failure Maternal Grandfather     Diet age 69 of "massive heart failure" unknown if MI or not      Prior to Admission medications   Medication Sig Start Date End Date Taking? Authorizing Provider  Adalimumab (HUMIRA [place] [facility]) Inject into the skin every 14 (fourteen) days.    Historical Provider, MD  allopurinol (ZYLOPRIM) 300 MG tablet Take 1 tablet (300 mg total) by mouth daily. 05/21/16   Gwenlyn Found Copland, MD  lisinopril (PRINIVIL,ZESTRIL) 20 MG tablet Take 30 mg (1.5 tablets) daily for high blood pressure. May increase to 2 tablets if needed Patient taking differently: Take 40 mg by mouth daily. Take 30 mg (1.5 tablets) daily for high blood pressure. May increase to  2 tablets if needed 08/30/16   Pearline Cables, MD  methotrexate (50 MG/ML) 1 g injection Inject into the vein once a week.    Historical Provider, MD  Multiple Vitamin (MULTIVITAMIN) capsule Take by mouth.    Historical Provider, MD  omeprazole (PRILOSEC) 40 MG capsule TAKE 1 CAPSULE BY MOUTH DAILY 08/20/16   Pearline Cables, MD  PREDNISONE PO Take by mouth. Taper per Dr. Dierdre Forth, rheumatology    Historical Provider, MD  Probiotic Product (ACIDOPHILUS/GOAT MILK) CAPS Take by mouth.    Historical Provider, MD  simvastatin (ZOCOR) 40 MG tablet Take 1 tablet (40 mg total) by mouth daily. 01/21/16   Pearline Cables, MD  traZODone (DESYREL) 100 MG tablet Take 1 tablet (100 mg total) by mouth at bedtime as needed for sleep. 06/16/16   Pearline Cables, MD    Physical Exam: Vitals:   09/03/16 1700 09/03/16 1800 09/03/16 1830 09/03/16 1957  BP: 156/84 141/79 139/77 (!) 146/82  Pulse: 89 91 92 86  Resp: 10 12 11    Temp:    98.6 F (37 C)  TempSrc:    Oral  SpO2: 98% 100% 99% 99%  Weight:    75.3 kg (165 lb 14.4 oz)  Height:    5\' 7"  (1.702 m)      Constitutional: NAD, calm, comfortable Eyes: PERRL, lids and conjunctivae normal ENMT: Mucous membranes are moist. Posterior pharynx clear  of any exudate or lesions.Normal dentition.  Neck: normal, supple, no masses, no thyromegaly Respiratory: clear to auscultation bilaterally, no wheezing, no crackles. Normal respiratory effort. No accessory muscle use.  Cardiovascular: Regular rate and rhythm, no murmurs / rubs / gallops. No extremity edema. 2+ pedal pulses. No carotid bruits.  Abdomen: no tenderness, no masses palpated. No hepatosplenomegaly. Bowel sounds positive.  Musculoskeletal: no clubbing / cyanosis. No joint deformity upper and lower extremities. Good ROM, no contractures. Normal muscle tone.  Skin: no rashes, lesions, ulcers. No induration Neurologic: CN 2-12 grossly intact. Sensation intact, DTR normal. Strength 5/5 in all 4.    Psychiatric: Normal judgment and insight. Alert and oriented x 3. Normal mood.    Labs on Admission: I have personally reviewed following labs and imaging studies  CBC:  Recent Labs Lab 09/03/16 1245  WBC 12.6*  HGB 15.0  HCT 43.9  MCV 94.2  PLT 346   Basic Metabolic Panel:  Recent Labs Lab 09/03/16 1245  NA 139  K 3.7  CL 106  CO2 25  GLUCOSE 101*  BUN 18  CREATININE 0.76  CALCIUM 9.6   GFR: Estimated Creatinine Clearance: 102.1 mL/min (by C-G formula based on SCr of 0.76 mg/dL). Liver Function Tests: No results for input(s): AST, ALT, ALKPHOS, BILITOT, PROT, ALBUMIN in the last 168 hours. No results for input(s): LIPASE, AMYLASE in the last 168 hours. No results for input(s): AMMONIA in the last 168 hours. Coagulation Profile: No results for input(s): INR, PROTIME in the last 168 hours. Cardiac Enzymes:  Recent Labs Lab 09/03/16 1245 09/03/16 1456  TROPONINI <0.03 <0.03   BNP (last 3 results) No results for input(s): PROBNP in the last 8760 hours. HbA1C: No results for input(s): HGBA1C in the last 72 hours. CBG: No results for input(s): GLUCAP in the last 168 hours. Lipid Profile: No results for input(s): CHOL, HDL, LDLCALC, TRIG, CHOLHDL, LDLDIRECT in the last 72 hours. Thyroid Function Tests: No results for input(s): TSH, T4TOTAL, FREET4, T3FREE, THYROIDAB in the last 72 hours. Anemia Panel: No results for input(s): VITAMINB12, FOLATE, FERRITIN, TIBC, IRON, RETICCTPCT in the last 72 hours. Urine analysis: No results found for: COLORURINE, APPEARANCEUR, LABSPEC, PHURINE, GLUCOSEU, HGBUR, BILIRUBINUR, KETONESUR, PROTEINUR, UROBILINOGEN, NITRITE, LEUKOCYTESUR Sepsis Labs: @LABRCNTIP (procalcitonin:4,lacticidven:4) )No results found for this or any previous visit (from the past 240 hour(s)).   Radiological Exams on Admission: Dg Chest 2 View  Result Date: 09/03/2016 CLINICAL DATA:  Chest tightness and cough for 4 weeks EXAM: CHEST  2 VIEW  COMPARISON:  None. FINDINGS: The heart size and mediastinal contours are within normal limits. Both lungs are clear. The visualized skeletal structures are unremarkable. IMPRESSION: No active cardiopulmonary disease. Electronically Signed   By: 13/12/2015   On: 09/03/2016 13:24    EKG: Independently reviewed.  Assessment/Plan Principal Problem:   Chest pain Active Problems:   Rheumatoid arthritis (HCC)    1. Chest pain - 1. CP obs pathway 2. Serial trops 3. Tele monitor 4. Npo after midnight 5. Stress test ordered 2. RA - continue home meds   DVT prophylaxis: Lovenox Code Status: Full Family Communication: Wife at bedside Consults called: None Admission status: Admit to obs   GARDNER, 13/12/2015 DO [facility] Pager [phone] from 7PM-7AM  If 7AM-7PM, please contact the day physician for the patient [email or web address] Password [email or web address]  09/03/2016, 8:29 PM

## 2016-09-03 NOTE — Progress Notes (Signed)
Subjective:    Patient ID: Timothy Mcintosh, male    DOB: 1966/07/23, 50 y.o.   MRN: [id number]  HPI  Pt in states for about one month or a while his blood pressure has been elevated. Pt states when he checked 160-180/95-105. Last week more on upper end. Pt since Monday went from lisinopirl 20 mg to 40 mg. Pt states intermittent can feel his heart beat and states he can at time feels faint heart beat sensation in left temporal area. Pt feels maybe some chest tightness.  Pt states rheumatologist gave him prednisone about 2 weeks. He is on taper dose. But last week did not take.   Pt has some chronic neck pain and chronic upper back pain. New diagnosis of RA pain. Recently more severe upper back pain.   Some moderate ha but no gross motor or sensory function deficits.  Recently increased sweating over past couple of weeks.Last 2 weeks chest pressure sensation with acitivity. Slight tightness today  and faint pressure.   Smoker since Mid 80's. Pt grandad maternal side deceased from  MI at his age. Pt mom and dad had high cholesterol.  Pt has history of smoking. History of elevated triglycerides. Low hdl. Pt not diabetic.   Review of Systems  Constitutional: Positive for diaphoresis. Negative for chills, fatigue and fever.  HENT: Negative for congestion, ear discharge, ear pain, sinus pressure and sore throat.   Cardiovascular: Positive for chest pain. Negative for palpitations and leg swelling.       Atypical symptoms.  Gastrointestinal: Negative for abdominal pain, constipation, diarrhea and vomiting.  Musculoskeletal: Positive for arthralgias and back pain.  Skin: Negative for rash.  Neurological: Positive for headaches. Negative for dizziness, seizures, syncope, weakness and numbness.  Hematological: Negative for adenopathy. Does not bruise/bleed easily.  Psychiatric/Behavioral: Negative for behavioral problems, confusion and sleep disturbance. The patient is not nervous/anxious.      Past Medical History:  Diagnosis Date  . Hyperlipidemia   . Hypertension      Social History   Social History  . Marital status: Married    Spouse name: N/A  . Number of children: N/A  . Years of education: N/A   Occupational History  . Not on file.   Social History Main Topics  . Smoking status: Current Every Day Smoker  . Smokeless tobacco: Never Used     Comment: smoking since age 31  . Alcohol use No  . Drug use: No  . Sexual activity: Not on file   Other Topics Concern  . Not on file   Social History Narrative  . No narrative on file    Past Surgical History:  Procedure Laterality Date  . BACK SURGERY Bilateral    c5 -c6  . CARPAL TUNNEL RELEASE Right     Family History  Problem Relation Age of Onset  . High Cholesterol Father   . High Cholesterol Brother   . Heart failure Maternal Grandfather     Diet age 54 of "massive heart failure" unknown if MI or not    No Known Allergies  No current facility-administered medications on file prior to visit.    Current Outpatient Prescriptions on File Prior to Visit  Medication Sig Dispense Refill  . allopurinol (ZYLOPRIM) 300 MG tablet Take 1 tablet (300 mg total) by mouth daily. 30 tablet 5  . lisinopril (PRINIVIL,ZESTRIL) 20 MG tablet Take 30 mg (1.5 tablets) daily for high blood pressure. May increase to 2 tablets if needed (Patient taking  differently: Take 40 mg by mouth daily. Take 30 mg (1.5 tablets) daily for high blood pressure. May increase to 2 tablets if needed) 60 tablet 6  . Multiple Vitamin (MULTIVITAMIN) capsule Take by mouth.    Marland Kitchen omeprazole (PRILOSEC) 40 MG capsule TAKE 1 CAPSULE BY MOUTH DAILY 30 capsule 4  . Probiotic Product (ACIDOPHILUS/GOAT MILK) CAPS Take by mouth.    . simvastatin (ZOCOR) 40 MG tablet Take 1 tablet (40 mg total) by mouth daily. 90 tablet 3  . traZODone (DESYREL) 100 MG tablet Take 1 tablet (100 mg total) by mouth at bedtime as needed for sleep. 90 tablet 3    BP  (!) 174/90   Pulse 87   Temp 98.1 F (36.7 C) (Oral)   Ht 5' 7.5" (1.715 m)   Wt 171 lb 12.8 oz (77.9 kg)   BMI 26.51 kg/m       Objective:   Physical Exam  General Mental Status- Alert. General Appearance- Not in acute distress.   Skin General: Color- Normal Color. Moisture- Normal Moisture.  Neck Carotid Arteries- Normal color. Moisture- Normal Moisture. No carotid bruits. No JVD.  Chest and Lung Exam Auscultation: Breath Sounds:-Normal. CTA.  Cardiovascular Auscultation:Rythm- Regular, rate and rythm Murmurs & Other Heart Sounds:Auscultation of the heart reveals- No Murmurs.  Abdomen Inspection:-Inspeection Normal. Palpation/Percussion:Note:No mass. Palpation and Percussion of the abdomen reveal- Non Tender, Non Distended + BS, no rebound or guarding.   Neurologic Cranial Nerve exam:- CN III-XII intact(No nystagmus), symmetric smile. Strength:- 5/5 equal and symmetric strength both upper and lower extremities.     Assessment & Plan:  With your intermittent chest pressure over, intermittent shortness of breath over past 2 weeks and  faint pressure today, I do think best to be seen in the ED now. ED can also evaluate your moderate to severe HA.  Your current high bp, history of htn, history  of smoking, high cholesterol and family history of MI are risk factors that make ED evaluation necessary.  I have talked with the ED physician and notified him of your presentation today.  They will make decision on bp med adjustment/additional med.  Follow up date to be determined by ED.

## 2016-09-03 NOTE — Progress Notes (Signed)
   09/03/16 1957  Vitals  Temp 98.6 F (37 C)  Temp Source Oral  BP (!) 146/82  BP Location Left Arm  BP Method Automatic  Patient Position (if appropriate) Lying  Pulse Rate 86  Pulse Rate Source Dinamap  Oxygen Therapy  SpO2 99 %  O2 Device Room Air  Height and Weight  Height 5\' 7"  (1.702 m)  Weight 75.3 kg (165 lb 14.4 oz)  Type of Scale Used Standing (scale C)  Type of Weight Actual  BSA (Calculated - sq m) 1.89 sq meters  BMI (Calculated) 26  Weight in (lb) to have BMI = 25 159.3  Admitted pt to rm 3E13 from [facility], pt alert and oriented, denied pain at this time, oriented to room, call bell placed within reach, pt placed on cardiac monitor CCMD made aware.

## 2016-09-03 NOTE — ED Notes (Signed)
Patient transported to X-ray 

## 2016-09-04 ENCOUNTER — Observation Stay (HOSPITAL_COMMUNITY): Payer: BLUE CROSS/BLUE SHIELD

## 2016-09-04 ENCOUNTER — Observation Stay (HOSPITAL_BASED_OUTPATIENT_CLINIC_OR_DEPARTMENT_OTHER): Payer: BLUE CROSS/BLUE SHIELD

## 2016-09-04 DIAGNOSIS — R079 Chest pain, unspecified: Secondary | ICD-10-CM | POA: Diagnosis not present

## 2016-09-04 LAB — TROPONIN I

## 2016-09-04 LAB — NM MYOCAR MULTI W/SPECT W/WALL MOTION / EF
CHL CUP MPHR: 169 {beats}/min
CHL CUP RESTING HR STRESS: 83 {beats}/min
CSEPHR: 76 %
CSEPPHR: 129 {beats}/min
Estimated workload: 1 METS
Exercise duration (min): 0 min
Exercise duration (sec): 0 s

## 2016-09-04 MED ORDER — OMEPRAZOLE 40 MG PO CPDR: 40.0000 mg | DELAYED_RELEASE_CAPSULE | Freq: Two times a day (BID) | ORAL | 4 refills | Status: DC

## 2016-09-04 MED ORDER — REGADENOSON 0.4 MG/5ML IV SOLN
INTRAVENOUS | Status: AC
  Filled 2016-09-04: qty 5

## 2016-09-04 MED ORDER — LISINOPRIL 20 MG PO TABS: 40.0000 mg | ORAL_TABLET | Freq: Every day | ORAL | 0 refills | Status: DC

## 2016-09-04 MED ORDER — TECHNETIUM TC 99M TETROFOSMIN IV KIT
30.0000 | PACK | Freq: Once | INTRAVENOUS | Status: AC | PRN
  Administered 2016-09-04: 30 via INTRAVENOUS

## 2016-09-04 MED ORDER — TECHNETIUM TC 99M TETROFOSMIN IV KIT
10.0000 | PACK | Freq: Once | INTRAVENOUS | Status: AC | PRN
  Administered 2016-09-04: 10 via INTRAVENOUS

## 2016-09-04 MED ORDER — REGADENOSON 0.4 MG/5ML IV SOLN
0.4000 mg | Freq: Once | INTRAVENOUS | Status: AC
  Administered 2016-09-04: 0.4 mg via INTRAVENOUS
  Filled 2016-09-04: qty 5

## 2016-09-04 NOTE — Discharge Summary (Signed)
[facility]  Physician Discharge Summary   Patient ID: Timothy Mcintosh MRN: [id number] DOB/AGE: [birth year] 51 y.o.  Admit date: 09/03/2016 Discharge date: 09/04/2016  PCP: Abbe Amsterdam, MD  DISCHARGE DIAGNOSES:  Principal Problem:   Chest pain Active Problems:   Rheumatoid arthritis (HCC)   RECOMMENDATIONS FOR OUTPATIENT FOLLOW UP: 1. Patient recommended to follow-up with his primary care physician within a week 2. He has been asked to take his Prilosec twice a day for now   DISCHARGE CONDITION: fair  Diet recommendation: Heart healthy  Filed Weights   09/03/16 1208 09/03/16 1957 09/04/16 0541  Weight: 77.6 kg (171 lb) 75.3 kg (165 lb 14.4 oz) 75.1 kg (165 lb 9.6 oz)    INITIAL HISTORY: 50 year old Caucasian male with a past medical history of hypertension, hyperlipidemia, rheumatoid arthritis, who smokes cigarettes presented with complaints of chest pressure and tightness in the left side, ongoing for the past 2 weeks.  Consultations:  None  Procedures: Nuclear stress test This was low risk with normal EF.  HOSPITAL COURSE:   Chest pain. Pain was quite atypical for cardiac etiology. His pain had been ongoing for 2 weeks. Patient's heart rate was normal. He was saturating normally on room air. EKG did not show any ischemic changes. Some LVH was noted. Patient ruled out for acute coronary syndrome by serial cardiac enzymes. Due to his ongoing symptoms, he underwent stress test, which was low risk. No reversible ischemia was noted. Patient was reassured. He does have a history of rheumatoid arthritis and had been on steroids recently. Chest pain could be musculoskeletal or GI in origin. Venous thromboembolism to his unlikely considering character of pain, duration and vital signs. Patient is asked to follow-up with his primary care physician for further management. He has been asked to take his PPI twice a day for now.  History of rheumatoid  arthritis. Continue with his home medications. Follow-up with rheumatology.  Essential hypertension. Blood pressure noted to be elevated. Could be related to pain or steroids that he was taking for his rheumatoid arthritis. He has been taking 40 mg of lisinopril daily instead of the 20 mg. Okay to do so at this time. Follow-up with PCP.  Overall, stable. Patient can be discharged home today.   PERTINENT LABS:  The results of significant diagnostics from this hospitalization (including imaging, microbiology, ancillary and laboratory) are listed below for reference.      Labs: Basic Metabolic Panel:  Recent Labs Lab 09/03/16 1245  NA 139  K 3.7  CL 106  CO2 25  GLUCOSE 101*  BUN 18  CREATININE 0.76  CALCIUM 9.6   CBC:  Recent Labs Lab 09/03/16 1245  WBC 12.6*  HGB 15.0  HCT 43.9  MCV 94.2  PLT 346   Cardiac Enzymes:  Recent Labs Lab 09/03/16 1245 09/03/16 1456 09/03/16 2051 09/03/16 2351 09/04/16 0224  TROPONINI <0.03 <0.03 <0.03 <0.03 <0.03     IMAGING STUDIES Dg Chest 2 View  Result Date: 09/03/2016 CLINICAL DATA:  Chest tightness and cough for 4 weeks EXAM: CHEST  2 VIEW COMPARISON:  None. FINDINGS: The heart size and mediastinal contours are within normal limits. Both lungs are clear. The visualized skeletal structures are unremarkable. IMPRESSION: No active cardiopulmonary disease. Electronically Signed   By: Elige Ko   On: 09/03/2016 13:24   Nm Myocar Multi W/spect Izetta Dakin Motion / Ef  Result Date: 09/04/2016 CLINICAL DATA:  Chest pain, smoker, hyperlipidemia and family history of coronary disease. EXAM: MYOCARDIAL IMAGING WITH SPECT (REST  AND PHARMACOLOGIC-STRESS) GATED LEFT VENTRICULAR WALL MOTION STUDY LEFT VENTRICULAR EJECTION FRACTION TECHNIQUE: Standard myocardial SPECT imaging was performed after resting intravenous injection of 10 mCi Tc-10m tetrofosmin. Subsequently, intravenous infusion of Lexiscan was performed under the supervision of  the Cardiology staff. At peak effect of the drug, 30 mCi Tc-61m tetrofosmin was injected intravenously and standard myocardial SPECT imaging was performed. Quantitative gated imaging was also performed to evaluate left ventricular wall motion, and estimate left ventricular ejection fraction. COMPARISON:  None. FINDINGS: Perfusion: Fixed inferior lateral defect. No significant decreased activity in the left ventricle on stress imaging to suggest reversible ischemia or infarction. Wall Motion: Normal left ventricular wall motion. No left ventricular dilation. Left Ventricular Ejection Fraction: 54 % End diastolic volume 102 ml End systolic volume 47 ml IMPRESSION: 1. No significant inducible ischemia with pharmacologic stress. Fixed inferior lateral defect. 2. Normal left ventricular wall motion. 3. Left ventricular ejection fraction 54% 4. Non invasive risk stratification*: Low *2012 Appropriate Use Criteria for Coronary Revascularization Focused Update: J Am Coll Cardiol. 2012;59(9):857-881. http://content.[email or web address]?[email or web address] Electronically Signed   By: Judie Petit.  Shick M.D.   On: 09/04/2016 12:45    DISCHARGE EXAMINATION: Vitals:   09/04/16 0952 09/04/16 0955 09/04/16 0956 09/04/16 1211  BP: (!) 159/67 (!) 160/72  (!) 119/94  Pulse: (!) 118 92 (!) 104 88  Resp:    20  Temp:    99.6 F (37.6 C)  TempSrc:    Oral  SpO2:    98%  Weight:      Height:       General appearance: alert, cooperative, appears stated age and no distress Resp: clear to auscultation bilaterally Cardio: regular rate and rhythm, S1, S2 normal, no murmur, click, rub or gallop GI: soft, non-tender; bowel sounds normal; no masses,  no organomegaly Extremities: extremities normal, atraumatic, no cyanosis or edema  DISPOSITION: Home  Discharge Instructions    Call MD for:  difficulty breathing, headache or visual disturbances    Complete by:  As directed    Call MD for:  extreme fatigue    Complete by:  As  directed    Call MD for:  persistant dizziness or light-headedness    Complete by:  As directed    Call MD for:  persistant nausea and vomiting    Complete by:  As directed    Call MD for:  severe uncontrolled pain    Complete by:  As directed    Call MD for:  temperature >100.4    Complete by:  As directed    Diet - low sodium heart healthy    Complete by:  As directed    Discharge instructions    Complete by:  As directed    Please follow up with your PCP within 1 week. Your pain could be due to musculoskeletal issues considering your history of rheumatoid arthritis. Since you had been on steroids recently, it may be reasonable to take a Prilosec twice a day for now as steroids can worsen, gastritis, and this can cause chest discomfort. Please seek attention if you develop dizziness, lightheadedness, have passing out episodes or your chest pains worsen significantly.  You were cared for by a hospitalist during your hospital stay. If you have any questions about your discharge medications or the care you received while you were in the hospital after you are discharged, you can call the unit and asked to speak with the hospitalist on call if the hospitalist that took care of you is not available.  Once you are discharged, your primary care physician will handle any further medical issues. Please note that NO REFILLS for any discharge medications will be authorized once you are discharged, as it is imperative that you return to your primary care physician (or establish a relationship with a primary care physician if you do not have one) for your aftercare needs so that they can reassess your need for medications and monitor your lab values. If you do not have a primary care physician, you can call [phone] for a physician referral.   Increase activity slowly    Complete by:  As directed       ALLERGIES: No Known Allergies   Current Discharge Medication List    CONTINUE these medications  which have CHANGED   Details  lisinopril (PRINIVIL,ZESTRIL) 20 MG tablet Take 2 tablets (40 mg total) by mouth daily. Take 30 mg (1.5 tablets) daily for high blood pressure. May increase to 2 tablets if needed Qty: 60 tablet, Refills: 0   Associated Diagnoses: Accelerated hypertension    omeprazole (PRILOSEC) 40 MG capsule Take 1 capsule (40 mg total) by mouth 2 (two) times daily. Qty: 30 capsule, Refills: 4      CONTINUE these medications which have NOT CHANGED   Details  Adalimumab (HUMIRA [place]) Inject into the skin every 14 (fourteen) days. Take on Mondays. Patient unsure of dose    allopurinol (ZYLOPRIM) 300 MG tablet Take 1 tablet (300 mg total) by mouth daily. Qty: 30 tablet, Refills: 5    methotrexate (50 MG/ML) 1 g injection Inject into the vein once a week. Take on Thursdays take 0.32ml once a week    Multiple Vitamin (MULTIVITAMIN) capsule Take 1 capsule by mouth daily.     Probiotic Product (ACIDOPHILUS/GOAT MILK) CAPS Take 1 capsule by mouth daily.     simvastatin (ZOCOR) 40 MG tablet Take 1 tablet (40 mg total) by mouth daily. Qty: 90 tablet, Refills: 3    traZODone (DESYREL) 100 MG tablet Take 1 tablet (100 mg total) by mouth at bedtime as needed for sleep. Qty: 90 tablet, Refills: 3      STOP taking these medications     PRESCRIPTION MEDICATION      PREDNISONE PO          Follow-up Information    COPLAND,JESSICA, MD. Schedule an appointment as soon as possible for a visit in 1 week(s).   Specialty:  Family Medicine Contact information: [address] [place] [address] [phone]           TOTAL DISCHARGE TIME: 35 minutes  [facility]  [facility] Pager [phone]  09/04/2016, 2:34 PM

## 2016-09-04 NOTE — Discharge Instructions (Signed)
Nonspecific Chest Pain  °Chest pain can be caused by many different conditions. There is always a chance that your pain could be related to something serious, such as a heart attack or a blood clot in your lungs. Chest pain can also be caused by conditions that are not life-threatening. If you have chest pain, it is very important to follow up with your health care provider. °CAUSES  °Chest pain can be caused by: °· Heartburn. °· Pneumonia or bronchitis. °· Anxiety or stress. °· Inflammation around your heart (pericarditis) or lung (pleuritis or pleurisy). °· A blood clot in your lung. °· A collapsed lung (pneumothorax). It can develop suddenly on its own (spontaneous pneumothorax) or from trauma to the chest. °· Shingles infection (varicella-zoster virus). °· Heart attack. °· Damage to the bones, muscles, and cartilage that make up your chest wall. This can include: °¨ Bruised bones due to injury. °¨ Strained muscles or cartilage due to frequent or repeated coughing or overwork. °¨ Fracture to one or more ribs. °¨ Sore cartilage due to inflammation (costochondritis). °RISK FACTORS  °Risk factors for chest pain may include: °· Activities that increase your risk for trauma or injury to your chest. °· Respiratory infections or conditions that cause frequent coughing. °· Medical conditions or overeating that can cause heartburn. °· Heart disease or family history of heart disease. °· Conditions or health behaviors that increase your risk of developing a blood clot. °· Having had chicken pox (varicella zoster). °SIGNS AND SYMPTOMS °Chest pain can feel like: °· Burning or tingling on the surface of your chest or deep in your chest. °· Crushing, pressure, aching, or squeezing pain. °· Dull or sharp pain that is worse when you move, cough, or take a deep breath. °· Pain that is also felt in your back, neck, shoulder, or arm, or pain that spreads to any of these areas. °Your chest pain may come and go, or it may stay  constant. °DIAGNOSIS °Lab tests or other studies may be needed to find the cause of your pain. Your health care provider may have you take a test called an ambulatory ECG (electrocardiogram). An ECG records your heartbeat patterns at the time the test is performed. You may also have other tests, such as: °· Transthoracic echocardiogram (TTE). During echocardiography, sound waves are used to create a picture of all of the heart structures and to look at how blood flows through your heart. °· Transesophageal echocardiogram (TEE). This is a more advanced imaging test that obtains images from inside your body. It allows your health care provider to see your heart in finer detail. °· Cardiac monitoring. This allows your health care provider to monitor your heart rate and rhythm in real time. °· Holter monitor. This is a portable device that records your heartbeat and can help to diagnose abnormal heartbeats. It allows your health care provider to track your heart activity for several days, if needed. °· Stress tests. These can be done through exercise or by taking medicine that makes your heart beat more quickly. °· Blood tests. °· Imaging tests. °TREATMENT  °Your treatment depends on what is causing your chest pain. Treatment may include: °· Medicines. These may include: °¨ Acid blockers for heartburn. °¨ Anti-inflammatory medicine. °¨ Pain medicine for inflammatory conditions. °¨ Antibiotic medicine, if an infection is present. °¨ Medicines to dissolve blood clots. °¨ Medicines to treat coronary artery disease. °· Supportive care for conditions that do not require medicines. This may include: °¨ Resting. °¨ Applying heat   or cold packs to injured areas. °¨ Limiting activities until pain decreases. °HOME CARE INSTRUCTIONS °· If you were prescribed an antibiotic medicine, finish it all even if you start to feel better. °· Avoid any activities that bring on chest pain. °· Do not use any tobacco products, including  cigarettes, chewing tobacco, or electronic cigarettes. If you need help quitting, ask your health care provider. °· Do not drink alcohol. °· Take medicines only as directed by your health care provider. °· Keep all follow-up visits as directed by your health care provider. This is important. This includes any further testing if your chest pain does not go away. °· If heartburn is the cause for your chest pain, you may be told to keep your head raised (elevated) while sleeping. This reduces the chance that acid will go from your stomach into your esophagus. °· Make lifestyle changes as directed by your health care provider. These may include: °¨ Getting regular exercise. Ask your health care provider to suggest some activities that are safe for you. °¨ Eating a heart-healthy diet. A registered dietitian can help you to learn healthy eating options. °¨ Maintaining a healthy weight. °¨ Managing diabetes, if necessary. °¨ Reducing stress. °SEEK MEDICAL CARE IF: °· Your chest pain does not go away after treatment. °· You have a rash with blisters on your chest. °· You have a fever. °SEEK IMMEDIATE MEDICAL CARE IF:  °· Your chest pain is worse. °· You have an increasing cough, or you cough up blood. °· You have severe abdominal pain. °· You have severe weakness. °· You faint. °· You have chills. °· You have sudden, unexplained chest discomfort. °· You have sudden, unexplained discomfort in your arms, back, neck, or jaw. °· You have shortness of breath at any time. °· You suddenly start to sweat, or your skin gets clammy. °· You feel nauseous or you vomit. °· You suddenly feel light-headed or dizzy. °· Your heart begins to beat quickly, or it feels like it is skipping beats. °These symptoms may represent a serious problem that is an emergency. Do not wait to see if the symptoms will go away. Get medical help right away. Call your local emergency services (911 in the [place] Do not drive yourself to the hospital. °  °This  information is not intended to replace advice given to you by your health care provider. Make sure you discuss any questions you have with your health care provider. °  °Document Released: 07/28/2005 Document Revised: 11/08/2014 Document Reviewed: 05/24/2014 °Elsevier Interactive Patient Education ©2016 [employer] ° °

## 2016-09-04 NOTE — Discharge Summary (Signed)
Pt got discharged to home, discharge instructions provided and patient showed understanding to it, IV taken out,Telemonitor DC,pt left unit in wheelchair with all of the belongings accompanied with a family member (wife) 

## 2016-09-04 NOTE — Progress Notes (Addendum)
      Timothy Mcintosh presented for a 1-day cardiolite today.  No immediate complications.  Stress imaging is pending at this time.  Ellsworth Lennox, PA-C 09/04/2016, 9:56 AM    Stress Test showed:   IMPRESSION: 1. No significant inducible ischemia with pharmacologic stress. Fixed inferior lateral defect.  2. Normal left ventricular wall motion.  3. Left ventricular ejection fraction 54%  4. Non invasive risk stratification*: Low   Admitting team made aware of results.  Signed, Ellsworth Lennox, PA-C 09/04/2016, 1:25 PM Pager: [phone]

## 2016-09-06 ENCOUNTER — Encounter: Payer: Self-pay | Admitting: Family Medicine

## 2016-09-13 ENCOUNTER — Ambulatory Visit: Payer: BLUE CROSS/BLUE SHIELD | Admitting: Medical

## 2016-09-14 DIAGNOSIS — M0579 Rheumatoid arthritis with rheumatoid factor of multiple sites without organ or systems involvement: Secondary | ICD-10-CM | POA: Diagnosis not present

## 2016-09-14 DIAGNOSIS — M2550 Pain in unspecified joint: Secondary | ICD-10-CM | POA: Diagnosis not present

## 2016-09-14 DIAGNOSIS — M1A09X0 Idiopathic chronic gout, multiple sites, without tophus (tophi): Secondary | ICD-10-CM | POA: Diagnosis not present

## 2016-09-14 DIAGNOSIS — M7989 Other specified soft tissue disorders: Secondary | ICD-10-CM | POA: Diagnosis not present

## 2016-12-09 ENCOUNTER — Ambulatory Visit (INDEPENDENT_AMBULATORY_CARE_PROVIDER_SITE_OTHER): Payer: BLUE CROSS/BLUE SHIELD | Admitting: Family Medicine

## 2016-12-09 ENCOUNTER — Encounter: Payer: Self-pay | Admitting: Family Medicine

## 2016-12-09 VITALS — BP 123/80 | HR 77 | Temp 98.7°F | Ht 67.5 in | Wt 181.4 lb

## 2016-12-09 DIAGNOSIS — Z5181 Encounter for therapeutic drug level monitoring: Secondary | ICD-10-CM

## 2016-12-09 DIAGNOSIS — Z125 Encounter for screening for malignant neoplasm of prostate: Secondary | ICD-10-CM | POA: Diagnosis not present

## 2016-12-09 DIAGNOSIS — M791 Myalgia, unspecified site: Secondary | ICD-10-CM

## 2016-12-09 DIAGNOSIS — E785 Hyperlipidemia, unspecified: Secondary | ICD-10-CM

## 2016-12-09 DIAGNOSIS — Z711 Person with feared health complaint in whom no diagnosis is made: Secondary | ICD-10-CM | POA: Diagnosis not present

## 2016-12-09 DIAGNOSIS — M2550 Pain in unspecified joint: Secondary | ICD-10-CM

## 2016-12-09 DIAGNOSIS — E875 Hyperkalemia: Secondary | ICD-10-CM

## 2016-12-09 LAB — CBC
HCT: 41.5 % (ref 39.0–52.0)
Hemoglobin: 14.2 g/dL (ref 13.0–17.0)
MCHC: 34.3 g/dL (ref 30.0–36.0)
MCV: 95.9 fl (ref 78.0–100.0)
PLATELETS: 311 10*3/uL (ref 150.0–400.0)
RBC: 4.33 Mil/uL (ref 4.22–5.81)
RDW: 13.5 % (ref 11.5–15.5)
WBC: 9.6 10*3/uL (ref 4.0–10.5)

## 2016-12-09 LAB — COMPREHENSIVE METABOLIC PANEL
ALK PHOS: 90 U/L (ref 39–117)
ALT: 9 U/L (ref 0–53)
AST: 11 U/L (ref 0–37)
Albumin: 4.6 g/dL (ref 3.5–5.2)
BILIRUBIN TOTAL: 0.3 mg/dL (ref 0.2–1.2)
BUN: 11 mg/dL (ref 6–23)
CALCIUM: 9.8 mg/dL (ref 8.4–10.5)
CO2: 29 mEq/L (ref 19–32)
Chloride: 110 mEq/L (ref 96–112)
Creatinine, Ser: 0.9 mg/dL (ref 0.40–1.50)
GFR: 94.25 mL/min (ref >60.00)
Glucose, Bld: 100 mg/dL — ABNORMAL HIGH (ref 70–99)
Potassium: 5.4 mEq/L — ABNORMAL HIGH (ref 3.5–5.1)
Sodium: 140 mEq/L (ref 135–145)
TOTAL PROTEIN: 6.5 g/dL (ref 6.0–8.3)

## 2016-12-09 LAB — CK: Total CK: 56 U/L (ref 7–232)

## 2016-12-09 LAB — LIPID PANEL
CHOLESTEROL: 123 mg/dL (ref 0–200)
HDL: 37.1 mg/dL — AB (ref >39.00)
LDL Cholesterol: 62 mg/dL (ref 0–99)
NonHDL: 86.05
Total CHOL/HDL Ratio: 3
Triglycerides: 120 mg/dL (ref 0.0–149.0)
VLDL: 24 mg/dL (ref 0.0–40.0)

## 2016-12-09 LAB — PSA: PSA: 0.5 ng/mL (ref 0.10–4.00)

## 2016-12-09 MED ORDER — TRAMADOL HCL 50 MG PO TABS: 50.0000 mg | ORAL_TABLET | Freq: Three times a day (TID) | ORAL | 0 refills | Status: DC | PRN

## 2016-12-09 NOTE — Patient Instructions (Addendum)
It was a pleasure to see you today Try stopping you zocor for a week or so- I wonder if this may help with your muscle pains. We will also do labs (liver function and CK) to look for any sign of muscle irritation. If stopping the zocor does not help you can restart. If it does help, let me know!   I gave you an rx for tramadol today- you can use this as needed for pain.  Remember this is a mild narcotic and should be used with caution when ibuprofen or tylenol do not control your pain  We will defer a tetanus shot for now as you may be stopping your methotrexate I will refer you to dermatology for a skin check

## 2016-12-09 NOTE — Progress Notes (Addendum)
[facility] at [facility] [address], [address] [place], [place] [address] [phone]  Date:  12/09/2016   Name:  Timothy Mcintosh   DOB:  02/09/1966   MRN:  [id number]  PCP:  Abbe Amsterdam, MD    Chief Complaint: Referral (Pt would like referral to dermatology for spots and moles on different places. Pt currently seeing rheumatologist for RA and has some concerns that he would like to discuss. )   History of Present Illness:  Timothy Mcintosh is a 51 y.o. very pleasant male patient who presents with the following:  Here today for a follow-up visit.  I last saw him in August for a sick visit.  He then came into clinic in Early November with concern of high BP.  He ended up being admitted and evaluated for CP as below.  His eval including stress was negative. Was in the hospital 09-03-2016 to 09-04-2016.  Hospital course from this stay as follows:  Chest pain. Pain was quite atypical for cardiac etiology. His pain had been ongoing for 2 weeks. Patient's heart rate was normal. He was saturating normally on room air. EKG did not show any ischemic changes. Some LVH was noted. Patient ruled out for acute coronary syndrome by serial cardiac enzymes. Due to his ongoing symptoms, he underwent stress test, which was low risk. No reversible ischemia was noted. Patient was reassured. He does have a history of rheumatoid arthritis and had been on steroids recently. Chest pain could be musculoskeletal or GI in origin. Venous thromboembolism to his unlikely considering character of pain, duration and vital signs. Patient is asked to follow-up with his primary care physician for further management. He has been asked to take his PPI twice a day for now.  History of rheumatoid arthritis. Continue with his home medications. Follow-up with rheumatology.  Essential hypertension. Blood pressure noted to be elevated. Could be related to pain or steroids that he was taking  for his rheumatoid arthritis. He has been taking 40 mg of lisinopril daily instead of the 20 mg. Okay to do so at this time. Follow-up with PCP.  Followed by Rheumatology, Dr. Dierdre Forth and Marcelino Duster, one of his PAs. He is no longer on prednisone for his RA- also stopped Embrel (no longer using as it was not helping), continues his weekly methotrexate. He is not sure what the next step will be- he continues to work with rheum and will see them soon He has pains in his joints but also in his muscles.  Mostly joint pains however. He thinks arthritis is likely the culprit.  His knees, hips, and elbows can be bothersome but he can also have pain in the muscles in his legs. His sx will wax and wane He has been on zocor for a long time. It has not caused him any joint pains in the past.   He is quite active- he thought that maybe he had just pulled a muscle but has noted that the muscle pains are more persistent He has tried ibuprofen and tylenol- ibuprofen is more helpful. However there are times when his muscle pains still hold him back from his desired activities  No fever except for a couple of weeks ago when he was ill- his did resolve  Flu shot and colonoscopy are UTD Cholesterol panel about a year ago Lab Results  Component Value Date   HGBA1C 5.3 12/25/2015   His HTN is controlled with lisinopril- looks good today BP  Readings from Last 3 Encounters:  12/09/16 123/80  09/04/16 (!) 119/94  09/03/16 (!) 174/90   He has noted some spots on his skin that he is concerned about- he does not have a derm MD. Did see one years ago.  Never dx with skin cancer. Would like a skin eval He is fasting today for labs   His last tetanus shot may be over 38 years old  Patient Active Problem List   Diagnosis Date Noted  . Chest pain 09/03/2016  . Rheumatoid arthritis (HCC) 04/01/2016  . Dyslipidemia 12/25/2015  . Tobacco use disorder 12/25/2015  . Insomnia 12/25/2015    Past Medical History:   Diagnosis Date  . Hyperlipidemia   . Hypertension     Past Surgical History:  Procedure Laterality Date  . BACK SURGERY Bilateral    c5 -c6  . CARPAL TUNNEL RELEASE Right     Social History  Substance Use Topics  . Smoking status: Current Every Day Smoker  . Smokeless tobacco: Never Used     Comment: smoking since age 51  . Alcohol use No    Family History  Problem Relation Age of Onset  . High Cholesterol Father   . High Cholesterol Brother   . Heart failure Maternal Grandfather     Diet age 51 of "massive heart failure" unknown if MI or not    No Known Allergies  Medication list has been reviewed and updated.  Current Outpatient Prescriptions on File Prior to Visit  Medication Sig Dispense Refill  . Adalimumab (HUMIRA Walker) Inject into the skin every 14 (fourteen) days. Take on Mondays. Patient unsure of dose    . allopurinol (ZYLOPRIM) 300 MG tablet Take 1 tablet (300 mg total) by mouth daily. 30 tablet 5  . lisinopril (PRINIVIL,ZESTRIL) 20 MG tablet Take 2 tablets (40 mg total) by mouth daily. Take 30 mg (1.5 tablets) daily for high blood pressure. May increase to 2 tablets if needed 60 tablet 0  . methotrexate (50 MG/ML) 1 g injection Inject into the vein once a week. Take on Thursdays take 0.30ml once a week    . Multiple Vitamin (MULTIVITAMIN) capsule Take 1 capsule by mouth daily.     Marland Kitchen omeprazole (PRILOSEC) 40 MG capsule Take 1 capsule (40 mg total) by mouth 2 (two) times daily. 30 capsule 4  . Probiotic Product (ACIDOPHILUS/GOAT MILK) CAPS Take 1 capsule by mouth daily.     . simvastatin (ZOCOR) 40 MG tablet Take 1 tablet (40 mg total) by mouth daily. 90 tablet 3  . traZODone (DESYREL) 100 MG tablet Take 1 tablet (100 mg total) by mouth at bedtime as needed for sleep. 90 tablet 3   No current facility-administered medications on file prior to visit.     Review of Systems:  As per HPI- otherwise negative.   Physical Examination: Vitals:   12/09/16 0928   BP: 123/80  Pulse: 77  Temp: 98.7 F (37.1 C)   Vitals:   12/09/16 0928  Weight: 181 lb 6.4 oz (82.3 kg)  Height: 5' 7.5" (1.715 m)   Body mass index is 27.99 kg/m. Ideal Body Weight: Weight in (lb) to have BMI = 25: 161.7  GEN: WDWN, NAD, Non-toxic, A & O x 3, looks well, normal weight  HEENT: Atraumatic, Normocephalic. Neck supple. No masses, No LAD. Bilateral TM wnl, oropharynx normal.  PEERL,EOMI.   Ears and Nose: No external deformity. CV: RRR, No M/G/R. No JVD. No thrill. No extra heart sounds. PULM: CTA  B, no wheezes, crackles, rhonchi. No retractions. No resp. distress. No accessory muscle use. ABD: S, NT, ND. No rebound. No HSM. EXTR: No c/c/e NEURO Normal gait.  PSYCH: Normally interactive. Conversant. Not depressed or anxious appearing.  Calm demeanor.  He does note that the muscles in his legs are mildly tender to palpation  Reviewed NCCSR: no entries for this pt    Assessment and Plan: Dyslipidemia - Plan: Lipid panel  Muscle pain - Plan: CK (Creatine Kinase), traMADol (ULTRAM) 50 MG tablet  Concern about skin cancer without diagnosis - Plan: Ambulatory referral to Dermatology  Screening for prostate cancer - Plan: PSA  Arthralgia, unspecified joint - Plan: traMADol (ULTRAM) 50 MG tablet  Medication monitoring encounter - Plan: Comprehensive metabolic panel, CBC  Here today to discuss a few concerns   It was a pleasure to see you today Try stopping you zocor for a week or so- I wonder if this may help with your muscle pains. We will also do labs (liver function and CK) to look for any sign of muscle irritation. If stopping the zocor does not help you can restart. If it does help, let me know!   I gave you an rx for tramadol today- you can use this as needed for pain.  Remember this is a mild narcotic and should be used with caution when ibuprofen or tylenol do not control your pain  We will defer a tetanus shot for now as you may be stopping your  methotrexate I will refer you to dermatology for a skin check Will plan further follow- up pending labs.   Signed Abbe Amsterdam, MD  Labs as below 2/9  Results for orders placed or performed in visit on 12/09/16  Lipid panel  Result Value Ref Range   Cholesterol 123 0 - 200 mg/dL   Triglycerides 254.2 0.0 - 149.0 mg/dL   HDL 70.62 (L) >37.62 mg/dL   VLDL 83.1 0.0 - 51.7 mg/dL   LDL Cholesterol 62 0 - 99 mg/dL   Total CHOL/HDL Ratio 3    NonHDL 86.05   CK (Creatine Kinase)  Result Value Ref Range   Total CK 56 7 - 232 U/L  Comprehensive metabolic panel  Result Value Ref Range   Sodium 140 135 - 145 mEq/L   Potassium 5.4 (H) 3.5 - 5.1 mEq/L   Chloride 110 96 - 112 mEq/L   CO2 29 19 - 32 mEq/L   Glucose, Bld 100 (H) 70 - 99 mg/dL   BUN 11 6 - 23 mg/dL   Creatinine, Ser 6.16 0.40 - 1.50 mg/dL   Total Bilirubin 0.3 0.2 - 1.2 mg/dL   Alkaline Phosphatase 90 39 - 117 U/L   AST 11 0 - 37 U/L   ALT 9 0 - 53 U/L   Total Protein 6.5 6.0 - 8.3 g/dL   Albumin 4.6 3.5 - 5.2 g/dL   Calcium 9.8 8.4 - 07.3 mg/dL   GFR 71.06 >26.94 mL/min  CBC  Result Value Ref Range   WBC 9.6 4.0 - 10.5 K/uL   RBC 4.33 4.22 - 5.81 Mil/uL   Platelets 311.0 150.0 - 400.0 K/uL   Hemoglobin 14.2 13.0 - 17.0 g/dL   HCT 85.4 62.7 - 03.5 %   MCV 95.9 78.0 - 100.0 fl   MCHC 34.3 30.0 - 36.0 g/dL   RDW 00.9 38.1 - 82.9 %  PSA  Result Value Ref Range   PSA 0.50 0.10 - 4.00 ng/mL

## 2016-12-10 ENCOUNTER — Encounter: Payer: Self-pay | Admitting: Family Medicine

## 2016-12-10 NOTE — Addendum Note (Signed)
Addended by: Abbe Amsterdam C on: 12/10/2016 01:04 PM   Modules accepted: Orders

## 2016-12-27 ENCOUNTER — Encounter: Payer: Self-pay | Admitting: Family Medicine

## 2016-12-29 ENCOUNTER — Other Ambulatory Visit (INDEPENDENT_AMBULATORY_CARE_PROVIDER_SITE_OTHER): Payer: BLUE CROSS/BLUE SHIELD

## 2016-12-29 DIAGNOSIS — E875 Hyperkalemia: Secondary | ICD-10-CM | POA: Diagnosis not present

## 2016-12-29 LAB — BASIC METABOLIC PANEL
BUN: 11 mg/dL (ref 6–23)
CO2: 25 mEq/L (ref 19–32)
Calcium: 9.4 mg/dL (ref 8.4–10.5)
Chloride: 106 mEq/L (ref 96–112)
Creatinine, Ser: 1.02 mg/dL (ref 0.40–1.50)
GFR: 81.56 mL/min (ref >60.00)
GLUCOSE: 143 mg/dL — AB (ref 70–99)
POTASSIUM: 4.4 meq/L (ref 3.5–5.1)
SODIUM: 137 meq/L (ref 135–145)

## 2016-12-31 DIAGNOSIS — M2550 Pain in unspecified joint: Secondary | ICD-10-CM | POA: Diagnosis not present

## 2016-12-31 DIAGNOSIS — M1A09X0 Idiopathic chronic gout, multiple sites, without tophus (tophi): Secondary | ICD-10-CM | POA: Diagnosis not present

## 2016-12-31 DIAGNOSIS — M0579 Rheumatoid arthritis with rheumatoid factor of multiple sites without organ or systems involvement: Secondary | ICD-10-CM | POA: Diagnosis not present

## 2016-12-31 DIAGNOSIS — M7989 Other specified soft tissue disorders: Secondary | ICD-10-CM | POA: Diagnosis not present

## 2017-01-05 DIAGNOSIS — L918 Other hypertrophic disorders of the skin: Secondary | ICD-10-CM | POA: Diagnosis not present

## 2017-01-05 DIAGNOSIS — D225 Melanocytic nevi of trunk: Secondary | ICD-10-CM | POA: Diagnosis not present

## 2017-01-05 DIAGNOSIS — L821 Other seborrheic keratosis: Secondary | ICD-10-CM | POA: Diagnosis not present

## 2017-01-11 ENCOUNTER — Other Ambulatory Visit: Payer: Self-pay | Admitting: Family Medicine

## 2017-01-12 ENCOUNTER — Other Ambulatory Visit: Payer: Self-pay | Admitting: Emergency Medicine

## 2017-01-17 ENCOUNTER — Telehealth: Payer: Self-pay | Admitting: Family Medicine

## 2017-01-17 DIAGNOSIS — M2550 Pain in unspecified joint: Secondary | ICD-10-CM

## 2017-01-17 DIAGNOSIS — M791 Myalgia, unspecified site: Secondary | ICD-10-CM

## 2017-01-18 MED ORDER — TRAMADOL HCL 50 MG PO TABS: 50.0000 mg | ORAL_TABLET | Freq: Three times a day (TID) | ORAL | 1 refills | Status: DC | PRN

## 2017-01-18 NOTE — Telephone Encounter (Signed)
Pt is requesting refill on Tramadol.  Last OV: 12/09/2016 Last Fill: 12/09/2016 #30 and 0RF   Please advise.

## 2017-02-03 ENCOUNTER — Other Ambulatory Visit: Payer: Self-pay | Admitting: Family Medicine

## 2017-02-21 DIAGNOSIS — M1A09X0 Idiopathic chronic gout, multiple sites, without tophus (tophi): Secondary | ICD-10-CM | POA: Diagnosis not present

## 2017-02-21 DIAGNOSIS — M0579 Rheumatoid arthritis with rheumatoid factor of multiple sites without organ or systems involvement: Secondary | ICD-10-CM | POA: Diagnosis not present

## 2017-03-31 ENCOUNTER — Other Ambulatory Visit: Payer: Self-pay | Admitting: Family Medicine

## 2017-04-14 ENCOUNTER — Other Ambulatory Visit: Payer: Self-pay | Admitting: Emergency Medicine

## 2017-04-14 DIAGNOSIS — I10 Essential (primary) hypertension: Secondary | ICD-10-CM

## 2017-04-14 MED ORDER — LISINOPRIL 20 MG PO TABS: 40.0000 mg | ORAL_TABLET | Freq: Every day | ORAL | 0 refills | Status: DC

## 2017-04-25 DIAGNOSIS — M1A09X0 Idiopathic chronic gout, multiple sites, without tophus (tophi): Secondary | ICD-10-CM | POA: Diagnosis not present

## 2017-04-25 DIAGNOSIS — M0579 Rheumatoid arthritis with rheumatoid factor of multiple sites without organ or systems involvement: Secondary | ICD-10-CM | POA: Diagnosis not present

## 2017-04-28 ENCOUNTER — Other Ambulatory Visit: Payer: Self-pay | Admitting: Family Medicine

## 2017-04-28 DIAGNOSIS — M2550 Pain in unspecified joint: Secondary | ICD-10-CM

## 2017-04-28 DIAGNOSIS — M791 Myalgia, unspecified site: Secondary | ICD-10-CM

## 2017-04-29 ENCOUNTER — Other Ambulatory Visit: Payer: Self-pay | Admitting: Family Medicine

## 2017-04-29 DIAGNOSIS — M791 Myalgia, unspecified site: Secondary | ICD-10-CM

## 2017-04-29 DIAGNOSIS — M2550 Pain in unspecified joint: Secondary | ICD-10-CM

## 2017-04-29 NOTE — Telephone Encounter (Signed)
NCCSR: he last filled tramadol on 5/27, then 3/20- #30. Ok to refil

## 2017-05-13 ENCOUNTER — Other Ambulatory Visit: Payer: Self-pay | Admitting: Family Medicine

## 2017-05-13 DIAGNOSIS — I10 Essential (primary) hypertension: Secondary | ICD-10-CM

## 2017-06-11 ENCOUNTER — Other Ambulatory Visit: Payer: Self-pay | Admitting: Family Medicine

## 2017-06-11 DIAGNOSIS — I10 Essential (primary) hypertension: Secondary | ICD-10-CM

## 2017-06-12 ENCOUNTER — Other Ambulatory Visit: Payer: Self-pay | Admitting: Family Medicine

## 2017-06-29 DIAGNOSIS — M0579 Rheumatoid arthritis with rheumatoid factor of multiple sites without organ or systems involvement: Secondary | ICD-10-CM | POA: Diagnosis not present

## 2017-06-29 DIAGNOSIS — M1A09X0 Idiopathic chronic gout, multiple sites, without tophus (tophi): Secondary | ICD-10-CM | POA: Diagnosis not present

## 2017-08-06 NOTE — Progress Notes (Signed)
[facility] at [facility] [address], [address] [place], [place] [address] [phone]  Date:  08/08/2017   Name:  Timothy Mcintosh   DOB:  02-Jul-1966   MRN:  [id number]  PCP:  Pearline Cables, MD    Chief Complaint: Follow-up   History of Present Illness:  Timothy Mcintosh is a 51 y.o. very pleasant male patient who presents with the following:  Here today for a follow-up visit History of RA, dyslipidemia. I last saw him in February of this year as follows:  Here today for a follow-up visit.  I last saw him in August for a sick visit.  He then came into clinic in Early November with concern of high BP.  He ended up being admitted and evaluated for CP as below.  His eval including stress was negative. Was in the hospital 09-03-2016 to 09-04-2016.  Hospital course from this stay as follows:  Chest pain. Pain was quite atypical for cardiac etiology. His pain had been ongoing for 2 weeks. Patient's heart rate was normal. He was saturating normally on room air. EKG did not show any ischemic changes. Some LVH was noted. Patient ruled out for acute coronary syndrome by serial cardiac enzymes. Due to his ongoing symptoms, he underwent stress test, which was low risk. No reversible ischemia was noted. Patient was reassured. He does have a history of rheumatoid arthritis and had been on steroids recently. Chest pain could be musculoskeletal or GI in origin. Venous thromboembolism to his unlikely considering character of pain, duration and vital signs. Patient is asked to follow-up with his primary care physician for further management. He has been asked to take his PPI twice a day for now.  History of rheumatoid arthritis. Continue with his home medications. Follow-up with rheumatology. Essential hypertension. Blood pressure noted to be elevated. Could be related to pain or steroids that he was taking for his rheumatoid arthritis. He has been taking 40 mg  of lisinopril daily instead of the 20 mg. Okay to do so at this time. Follow-up with PCP.  Followed by Rheumatology, Dr. Dierdre Forth and Marcelino Duster, one of his PAs. He is no longer on prednisone for his RA- also stopped Embrel (no longer using as it was not helping), continues his weekly methotrexate. He is not sure what the next step will be- he continues to work with rheum and will see them soon He has pains in his joints but also in his muscles.  Mostly joint pains however. He thinks arthritis is likely the culprit.  His knees, hips, and elbows can be bothersome but he can also have pain in the muscles in his legs. His sx will wax and wane He has been on zocor for a long time. It has not caused him any joint pains in the past.   He is quite active- he thought that maybe he had just pulled a muscle but has noted that the muscle pains are more persistent He has tried ibuprofen and tylenol- ibuprofen is more helpful. However there are times when his muscle pains still hold him back from his desired activities  No fever except for a couple of weeks ago when he was ill- his did resolve  We had him try stopping his cholesterol med for a week or to to see if it might help with his body aches Due for his flu shot; done today Tetanus vaccine:asked pt, he thinks this was about a year ago when  he had a wound   He has not had any more issues with chest pain He continues to see rheumatology and is doing better with [name] 5 mg once a day He is also taking Arava 20 mg once a day He continues to take his cholesterol med as his numbers went up when he tried to stop taking it He is still on 20 mg of lisinopril His home BP tends to be 120- 130s/ 80  BP Readings from Last 3 Encounters:  08/08/17 128/79  12/09/16 123/80  09/04/16 (!) 119/94   He would like to quit smoking. He tried chantix but had to stop as he got sick (unrealated to chantix). He also tried wellbutrin years ago and it worked well, but he  did not sleep well with it.  He would like to try this again and see how it works for him  He has 3 grandchildren, 6 grandkids.  He is not fasting today   Patient Active Problem List   Diagnosis Date Noted  . Chest pain 09/03/2016  . Rheumatoid arthritis (HCC) 04/01/2016  . Dyslipidemia 12/25/2015  . Tobacco use disorder 12/25/2015  . Insomnia 12/25/2015    Past Medical History:  Diagnosis Date  . Hyperlipidemia   . Hypertension     Past Surgical History:  Procedure Laterality Date  . BACK SURGERY Bilateral    c5 -c6  . CARPAL TUNNEL RELEASE Right     Social History  Substance Use Topics  . Smoking status: Current Every Day Smoker  . Smokeless tobacco: Never Used     Comment: smoking since age 77  . Alcohol use No    Family History  Problem Relation Age of Onset  . High Cholesterol Father   . High Cholesterol Brother   . Heart failure Maternal Grandfather        Diet age 27 of "massive heart failure" unknown if MI or not    No Known Allergies  Medication list has been reviewed and updated.  Current Outpatient Prescriptions on File Prior to Visit  Medication Sig Dispense Refill  . allopurinol (ZYLOPRIM) 300 MG tablet TAKE 1 TABLET (300 MG TOTAL) BY MOUTH DAILY 90 tablet 4  . lisinopril (PRINIVIL,ZESTRIL) 20 MG tablet Take 1 and 1/2 tablets by mouth daily for blood pressure, may increase to 2 tablets daily if needed. 60 tablet 1  . Multiple Vitamin (MULTIVITAMIN) capsule Take 1 capsule by mouth daily.     Marland Kitchen omeprazole (PRILOSEC) 40 MG capsule Take 1 capsule (40 mg total) by mouth 2 (two) times daily. 30 capsule 4  . Probiotic Product (ACIDOPHILUS/GOAT MILK) CAPS Take 1 capsule by mouth daily.     . simvastatin (ZOCOR) 40 MG tablet TAKE 1 TABLET BY MOUTH DAILY 90 tablet 2  . traMADol (ULTRAM) 50 MG tablet TAKE ONE TABLET BY MOUTH EVERY 8 HOURS AS NEEDED FOR PAIN 30 tablet 1  . traZODone (DESYREL) 100 MG tablet Take 1 tablet (100 mg total) by mouth at bedtime as  needed for sleep. 30 tablet 1   No current facility-administered medications on file prior to visit.     Review of Systems:  As per HPI- otherwise negative. No CP or SOB  Physical Examination: Vitals:   08/08/17 0903 08/08/17 0906  BP: (!) 143/80 128/79  Pulse: 88   Temp: 98.2 F (36.8 C)   SpO2: 97%    Vitals:   08/08/17 0903  Weight: 173 lb 9.6 oz (78.7 kg)  Height: 5' 7.5" (1.715 m)  Body mass index is 26.79 kg/m. Ideal Body Weight: Weight in (lb) to have BMI = 25: 161.7  GEN: WDWN, NAD, Non-toxic, A & O x 3, looks well HEENT: Atraumatic, Normocephalic. Neck supple. No masses, No LAD.  Bilateral TM wnl, oropharynx normal.  PEERL,EOMI.   Ears and Nose: No external deformity. CV: RRR, No M/G/R. No JVD. No thrill. No extra heart sounds. PULM: CTA B, no wheezes, crackles, rhonchi. No retractions. No resp. distress. No accessory muscle use. EXTR: No c/c/e NEURO Normal gait.  PSYCH: Normally interactive. Conversant. Not depressed or anxious appearing.  Calm demeanor.    Assessment and Plan: Tobacco use - Plan: buPROPion (WELLBUTRIN SR) 150 MG 12 hr tablet  Muscle pain  Medication monitoring encounter  Essential hypertension  Rheumatoid arthritis, involving unspecified site, unspecified rheumatoid factor presence (HCC) - Plan: Tofacitinib Citrate (XELJANZ) 5 MG TABS, leflunomide (ARAVA) 20 MG tablet  Need for influenza vaccination - Plan: Flu Vaccine QUAD 6+ mos PF IM (Fluarix Quad PF)  Here today for a follow-up visit He is on a new RA regimen which seems to be working well for him His BP is under control- continue current medication He is taking zocor- will plan to repeat labs for him in the spring as she is not fasting today Flu shot today rx for wellbutrin to assist in smoking cessation. Discussed use of medication with him  Signed Abbe Amsterdam, MD

## 2017-08-08 ENCOUNTER — Other Ambulatory Visit: Payer: Self-pay | Admitting: Family Medicine

## 2017-08-08 ENCOUNTER — Ambulatory Visit (INDEPENDENT_AMBULATORY_CARE_PROVIDER_SITE_OTHER): Payer: BLUE CROSS/BLUE SHIELD | Admitting: Family Medicine

## 2017-08-08 ENCOUNTER — Encounter: Payer: Self-pay | Admitting: Family Medicine

## 2017-08-08 VITALS — BP 128/79 | HR 88 | Temp 98.2°F | Ht 67.5 in | Wt 173.6 lb

## 2017-08-08 DIAGNOSIS — Z5181 Encounter for therapeutic drug level monitoring: Secondary | ICD-10-CM

## 2017-08-08 DIAGNOSIS — Z720 Tobacco use: Secondary | ICD-10-CM | POA: Diagnosis not present

## 2017-08-08 DIAGNOSIS — M069 Rheumatoid arthritis, unspecified: Secondary | ICD-10-CM

## 2017-08-08 DIAGNOSIS — Z23 Encounter for immunization: Secondary | ICD-10-CM | POA: Diagnosis not present

## 2017-08-08 DIAGNOSIS — I10 Essential (primary) hypertension: Secondary | ICD-10-CM | POA: Diagnosis not present

## 2017-08-08 DIAGNOSIS — M791 Myalgia, unspecified site: Secondary | ICD-10-CM | POA: Diagnosis not present

## 2017-08-08 MED ORDER — TOFACITINIB CITRATE 5 MG PO TABS: ORAL_TABLET | ORAL | 0 refills | Status: DC

## 2017-08-08 MED ORDER — BUPROPION HCL ER (SR) 150 MG PO TB12: 150.0000 mg | ORAL_TABLET | Freq: Two times a day (BID) | ORAL | 3 refills | Status: DC

## 2017-08-08 MED ORDER — LEFLUNOMIDE 20 MG PO TABS: 20.0000 mg | ORAL_TABLET | Freq: Every day | ORAL | 0 refills | Status: DC

## 2017-08-08 NOTE — Patient Instructions (Addendum)
It was good to see you again!  Please see me in the early spring (feb/ march) to have fasting labs, etc We will have you use wellbutrin again for smoking cessation- take it once a day for 3-4 days, then increase to twice a day  Best of luck with quitting smoking and take care!

## 2017-08-09 ENCOUNTER — Other Ambulatory Visit: Payer: Self-pay | Admitting: Emergency Medicine

## 2017-08-09 DIAGNOSIS — I10 Essential (primary) hypertension: Secondary | ICD-10-CM

## 2017-08-09 MED ORDER — LISINOPRIL 20 MG PO TABS: ORAL_TABLET | ORAL | 1 refills | Status: DC

## 2017-08-09 MED ORDER — TRAZODONE HCL 100 MG PO TABS: 100.0000 mg | ORAL_TABLET | Freq: Every evening | ORAL | 2 refills | Status: DC | PRN

## 2017-08-09 NOTE — Telephone Encounter (Signed)
Requesting: traZODone (DESYREL) 100 MG tablet Last OV: 08/08/2017 Last Refill: 06/14/2017  Please Advise

## 2017-08-30 ENCOUNTER — Encounter: Payer: Self-pay | Admitting: Family Medicine

## 2017-08-30 DIAGNOSIS — N529 Male erectile dysfunction, unspecified: Secondary | ICD-10-CM

## 2017-08-30 MED ORDER — SILDENAFIL CITRATE 20 MG PO TABS: ORAL_TABLET | ORAL | 5 refills | Status: DC

## 2017-08-30 NOTE — Addendum Note (Signed)
Addended by: Abbe Amsterdam C on: 08/30/2017 08:07 PM   Modules accepted: Orders

## 2017-09-01 DIAGNOSIS — M0579 Rheumatoid arthritis with rheumatoid factor of multiple sites without organ or systems involvement: Secondary | ICD-10-CM | POA: Diagnosis not present

## 2017-09-01 DIAGNOSIS — M1A09X0 Idiopathic chronic gout, multiple sites, without tophus (tophi): Secondary | ICD-10-CM | POA: Diagnosis not present

## 2017-09-12 ENCOUNTER — Emergency Department (HOSPITAL_BASED_OUTPATIENT_CLINIC_OR_DEPARTMENT_OTHER)
Admission: EM | Admit: 2017-09-12 | Discharge: 2017-09-12 | Disposition: A | Discharge status: Home / Self Care | Payer: BLUE CROSS/BLUE SHIELD | Attending: Emergency Medicine | Admitting: Emergency Medicine

## 2017-09-12 ENCOUNTER — Other Ambulatory Visit: Payer: Self-pay

## 2017-09-12 ENCOUNTER — Encounter (HOSPITAL_BASED_OUTPATIENT_CLINIC_OR_DEPARTMENT_OTHER): Payer: Self-pay

## 2017-09-12 ENCOUNTER — Encounter: Payer: Self-pay | Admitting: Family Medicine

## 2017-09-12 ENCOUNTER — Emergency Department (HOSPITAL_BASED_OUTPATIENT_CLINIC_OR_DEPARTMENT_OTHER): Payer: BLUE CROSS/BLUE SHIELD

## 2017-09-12 ENCOUNTER — Ambulatory Visit: Payer: BLUE CROSS/BLUE SHIELD | Admitting: Family Medicine

## 2017-09-12 VITALS — BP 168/91 | HR 97 | Temp 98.0°F | Ht 67.5 in | Wt 168.8 lb

## 2017-09-12 DIAGNOSIS — R519 Headache, unspecified: Secondary | ICD-10-CM

## 2017-09-12 DIAGNOSIS — Y939 Activity, unspecified: Secondary | ICD-10-CM | POA: Insufficient documentation

## 2017-09-12 DIAGNOSIS — S060X1A Concussion with loss of consciousness of 30 minutes or less, initial encounter: Secondary | ICD-10-CM | POA: Diagnosis not present

## 2017-09-12 DIAGNOSIS — W11XXXA Fall on and from ladder, initial encounter: Secondary | ICD-10-CM | POA: Insufficient documentation

## 2017-09-12 DIAGNOSIS — F17200 Nicotine dependence, unspecified, uncomplicated: Secondary | ICD-10-CM | POA: Insufficient documentation

## 2017-09-12 DIAGNOSIS — Y990 Civilian activity done for income or pay: Secondary | ICD-10-CM | POA: Insufficient documentation

## 2017-09-12 DIAGNOSIS — Y929 Unspecified place or not applicable: Secondary | ICD-10-CM | POA: Diagnosis not present

## 2017-09-12 DIAGNOSIS — I10 Essential (primary) hypertension: Secondary | ICD-10-CM | POA: Diagnosis not present

## 2017-09-12 DIAGNOSIS — S299XXA Unspecified injury of thorax, initial encounter: Secondary | ICD-10-CM | POA: Diagnosis not present

## 2017-09-12 DIAGNOSIS — R51 Headache: Secondary | ICD-10-CM | POA: Diagnosis not present

## 2017-09-12 DIAGNOSIS — Z79899 Other long term (current) drug therapy: Secondary | ICD-10-CM | POA: Insufficient documentation

## 2017-09-12 DIAGNOSIS — S0990XA Unspecified injury of head, initial encounter: Secondary | ICD-10-CM | POA: Diagnosis not present

## 2017-09-12 DIAGNOSIS — S199XXA Unspecified injury of neck, initial encounter: Secondary | ICD-10-CM | POA: Diagnosis not present

## 2017-09-12 DIAGNOSIS — R079 Chest pain, unspecified: Secondary | ICD-10-CM | POA: Diagnosis not present

## 2017-09-12 DIAGNOSIS — W19XXXA Unspecified fall, initial encounter: Secondary | ICD-10-CM

## 2017-09-12 DIAGNOSIS — M542 Cervicalgia: Secondary | ICD-10-CM | POA: Diagnosis not present

## 2017-09-12 HISTORY — DX: Rheumatoid arthritis, unspecified: M06.9

## 2017-09-12 LAB — CBC WITH DIFFERENTIAL/PLATELET
Basophils Absolute: 0 10*3/uL (ref 0.0–0.1)
Basophils Relative: 0 %
Eosinophils Absolute: 0 10*3/uL (ref 0.0–0.7)
Eosinophils Relative: 0 %
HCT: 41.2 % (ref 39.0–52.0)
Hemoglobin: 14.1 g/dL (ref 13.0–17.0)
LYMPHS ABS: 2.4 10*3/uL (ref 0.7–4.0)
LYMPHS PCT: 22 %
MCH: 29.7 pg (ref 26.0–34.0)
MCHC: 34.2 g/dL (ref 30.0–36.0)
MCV: 86.9 fL (ref 78.0–100.0)
MONO ABS: 0.6 10*3/uL (ref 0.1–1.0)
Monocytes Relative: 5 %
NEUTROS PCT: 73 %
Neutro Abs: 8.2 10*3/uL — ABNORMAL HIGH (ref 1.7–7.7)
PLATELETS: 306 10*3/uL (ref 150–400)
RBC: 4.74 MIL/uL (ref 4.22–5.81)
RDW: 12.6 % (ref 11.5–15.5)
WBC: 11.3 10*3/uL — ABNORMAL HIGH (ref 4.0–10.5)

## 2017-09-12 LAB — URINALYSIS, ROUTINE W REFLEX MICROSCOPIC
Bilirubin Urine: NEGATIVE
GLUCOSE, UA: NEGATIVE mg/dL
HGB URINE DIPSTICK: NEGATIVE
Ketones, ur: NEGATIVE mg/dL
Leukocytes, UA: NEGATIVE
Nitrite: NEGATIVE
PH: 5.5 (ref 5.0–8.0)
Protein, ur: NEGATIVE mg/dL
SPECIFIC GRAVITY, URINE: 1.01 (ref 1.005–1.030)

## 2017-09-12 LAB — MAGNESIUM: MAGNESIUM: 1.9 mg/dL (ref 1.7–2.4)

## 2017-09-12 LAB — COMPREHENSIVE METABOLIC PANEL
ALK PHOS: 99 U/L (ref 38–126)
ALT: 15 U/L — AB (ref 17–63)
AST: 19 U/L (ref 15–41)
Albumin: 4.6 g/dL (ref 3.5–5.0)
Anion gap: 8 (ref 5–15)
BILIRUBIN TOTAL: 0.5 mg/dL (ref 0.3–1.2)
BUN: 10 mg/dL (ref 6–20)
CALCIUM: 9.1 mg/dL (ref 8.9–10.3)
CHLORIDE: 108 mmol/L (ref 101–111)
CO2: 22 mmol/L (ref 22–32)
CREATININE: 0.67 mg/dL (ref 0.61–1.24)
Glucose, Bld: 100 mg/dL — ABNORMAL HIGH (ref 65–99)
Potassium: 3.3 mmol/L — ABNORMAL LOW (ref 3.5–5.1)
Sodium: 138 mmol/L (ref 135–145)
TOTAL PROTEIN: 7.1 g/dL (ref 6.5–8.1)

## 2017-09-12 LAB — RAPID URINE DRUG SCREEN, HOSP PERFORMED
AMPHETAMINES: NOT DETECTED
BENZODIAZEPINES: POSITIVE — AB
Barbiturates: NOT DETECTED
Cocaine: NOT DETECTED
Opiates: NOT DETECTED
TETRAHYDROCANNABINOL: POSITIVE — AB

## 2017-09-12 LAB — LIPASE, BLOOD: LIPASE: 45 U/L (ref 11–51)

## 2017-09-12 LAB — PROTIME-INR
INR: 0.93
Prothrombin Time: 12.4 seconds (ref 11.4–15.2)

## 2017-09-12 LAB — TROPONIN I: Troponin I: <0.03 ng/mL (ref <0.03)

## 2017-09-12 MED ORDER — PROCHLORPERAZINE MALEATE 10 MG PO TABS
ORAL_TABLET | ORAL | Status: AC
  Filled 2017-09-12: qty 1

## 2017-09-12 MED ORDER — PROCHLORPERAZINE MALEATE 10 MG PO TABS: 10.0000 mg | ORAL_TABLET | Freq: Two times a day (BID) | ORAL | 0 refills | Status: DC | PRN

## 2017-09-12 MED ORDER — PROCHLORPERAZINE MALEATE 10 MG PO TABS
10.0000 mg | ORAL_TABLET | Freq: Once | ORAL | Status: AC
  Administered 2017-09-12: 10 mg via ORAL

## 2017-09-12 MED FILL — PROCHLORPERAZINE 10 MG TAB: 10 | 5 days supply | Qty: 10 | Fill #0

## 2017-09-12 NOTE — Discharge Instructions (Signed)
Your workup today was overall reassuring.  We did not find evidence of fracture or dislocation in your head or neck.  No bleeding inside the head.  No evidence of infections.  We suspect you are having a postconcussive syndrome causing your constellation of symptoms.  Please use the nausea medicine to help stay hydrated and to improve her headache.  Please follow-up with your primary care physician as well as follow-up with neurology if your symptoms persist.  If any symptoms change or worsen, please return to the nearest emergency department.

## 2017-09-12 NOTE — ED Notes (Signed)
ED Provider at bedside to discuss test results and plan of care

## 2017-09-12 NOTE — ED Notes (Addendum)
Pt reports that he took a double dose of trazodone last Tuesday. States that on Wednesday he was climbing a ladder and fell. Pt does not remember the fall. Reports that the fall was witnessed. Unknown LOC. Pt was able to get off the ground without difficulty. Repetitive questioning and slurred speech after fall. States that he felt 'drunk' after the fall-denies N/V. Reports that his wife drove him to the ED on Thursday and he refused treatment. States that he slept all day Friday and Saturday. Reports Sunday and today has been emotional, inability to sleep, and felt 'off'.   Reports generalized body aches-HA 8/10 (constant posterior HA).  Decreased appetite. Reports hx of chronic neck pain-'different pain' in neck since the fall. Denies double vision. Reports 'thought process is off'. Decreased ability to concentrate.

## 2017-09-12 NOTE — ED Provider Notes (Signed)
[facility] EMERGENCY DEPARTMENT Provider Note   CSN: [id number] Arrival date & time: 09/12/17  1200     History   Chief Complaint Chief Complaint  Patient presents with  . Multiple c/o    HPI Timothy Mcintosh is a 51 y.o. male.  The history is provided by the patient, the spouse, medical records and a caregiver. No language interpreter was used.  Fall  This is a new problem. The current episode started more than 2 days ago. The problem occurs constantly. The problem has not changed since onset.Associated symptoms include headaches. Pertinent negatives include no chest pain, no abdominal pain and no shortness of breath. Nothing aggravates the symptoms. Nothing relieves the symptoms. He has tried nothing for the symptoms. The treatment provided no relief.    Past Medical History:  Diagnosis Date  . Hyperlipidemia   . Hypertension   . Rheumatoid arthritis [facility])     Patient Active Problem List   Diagnosis Date Noted  . Chest pain 09/03/2016  . Rheumatoid arthritis (HCC) 04/01/2016  . Dyslipidemia 12/25/2015  . Tobacco use disorder 12/25/2015  . Insomnia 12/25/2015    Past Surgical History:  Procedure Laterality Date  . BACK SURGERY Bilateral    c5 -c6  . CARPAL TUNNEL RELEASE Right        Home Medications    Prior to Admission medications   Medication Sig Start Date End Date Taking? Authorizing Provider  allopurinol (ZYLOPRIM) 300 MG tablet TAKE 1 TABLET (300 MG TOTAL) BY MOUTH DAILY 02/03/17   Copland, Gwenlyn Found, MD  buPROPion (WELLBUTRIN SR) 150 MG 12 hr tablet Take 1 tablet (150 mg total) by mouth 2 (two) times daily. Patient not taking: Reported on 09/12/2017 08/08/17   Copland, Gwenlyn Found, MD  leflunomide (ARAVA) 20 MG tablet Take 1 tablet (20 mg total) by mouth daily. 08/08/17   Copland, Gwenlyn Found, MD  lisinopril (PRINIVIL,ZESTRIL) 20 MG tablet Take 1 and 1/2 tablets by mouth daily for blood pressure, may increase to 2 tablets daily if needed. 08/09/17    Copland, Gwenlyn Found, MD  Multiple Vitamin (MULTIVITAMIN) capsule Take 1 capsule by mouth daily.     [provider]  omeprazole (PRILOSEC) 40 MG capsule Take 1 capsule (40 mg total) by mouth 2 (two) times daily. 09/04/16   Osvaldo Shipper, MD  Probiotic Product (ACIDOPHILUS/GOAT MILK) CAPS Take 1 capsule by mouth daily.     [provider]  sildenafil (REVATIO) 20 MG tablet Take 20- 100 mg once daily as needed for ED 08/30/17   Copland, Gwenlyn Found, MD  simvastatin (ZOCOR) 40 MG tablet TAKE 1 TABLET BY MOUTH DAILY 01/12/17   Copland, Gwenlyn Found, MD  Tofacitinib Citrate (XELJANZ) 5 MG TABS Take 1 daily- per rheumatology 08/08/17   Copland, Gwenlyn Found, MD  traMADol (ULTRAM) 50 MG tablet TAKE ONE TABLET BY MOUTH EVERY 8 HOURS AS NEEDED FOR PAIN 04/29/17   Copland, Gwenlyn Found, MD  traZODone (DESYREL) 100 MG tablet Take 1 tablet (100 mg total) by mouth at bedtime as needed for sleep. 08/09/17   Copland, Gwenlyn Found, MD    Family History Family History  Problem Relation Age of Onset  . High Cholesterol Father   . High Cholesterol Brother   . Heart failure Maternal Grandfather        Diet age 89 of "massive heart failure" unknown if MI or not    Social History Social History   Tobacco Use  . Smoking status: Current Every Day Smoker  .  Smokeless tobacco: Never Used  . Tobacco comment: smoking since age 1  Substance Use Topics  . Alcohol use: No    Alcohol/week: 0.0 oz  . Drug use: No     Allergies   Patient has no known allergies.   Review of Systems Review of Systems  Constitutional: Negative for chills, diaphoresis, fatigue and fever.  HENT: Negative for congestion and rhinorrhea.   Eyes: Negative for visual disturbance.  Respiratory: Negative for cough, chest tightness, shortness of breath, wheezing and stridor.   Cardiovascular: Negative for chest pain and palpitations.  Gastrointestinal: Negative for abdominal pain, constipation, diarrhea, nausea and vomiting.    Genitourinary: Negative for flank pain.  Musculoskeletal: Positive for neck pain. Negative for back pain and neck stiffness.  Skin: Negative for rash and wound.  Neurological: Positive for syncope, light-headedness and headaches. Negative for dizziness, tremors, seizures, facial asymmetry, speech difficulty, weakness and numbness.  Hematological: Negative for adenopathy.  Psychiatric/Behavioral: Positive for confusion and decreased concentration. Negative for agitation and hallucinations.  All other systems reviewed and are negative.    Physical Exam Updated Vital Signs BP (!) 157/107 (BP Location: Left Arm)   Pulse 96   Temp 98.1 F (36.7 C)   Resp 18   Ht 5\' 7"  (1.702 m)   Wt 75 kg (165 lb 6 oz)   SpO2 100%   BMI 25.90 kg/m   Physical Exam  Constitutional: He is oriented to person, place, and time. He appears well-developed and well-nourished. No distress.  HENT:  Head: Normocephalic and atraumatic. Head is without abrasion and without contusion.    Right Ear: External ear normal.  Left Ear: External ear normal.  Nose: Nose normal.  Mouth/Throat: Oropharynx is clear and moist. No oropharyngeal exudate.  Eyes: Conjunctivae and EOM are normal. Pupils are equal, round, and reactive to light.  Neck: Normal range of motion. Neck supple. Muscular tenderness present. No spinous process tenderness present. No edema, no erythema and normal range of motion present.    Cardiovascular: Normal rate and intact distal pulses.  No murmur heard. Pulmonary/Chest: Effort normal and breath sounds normal. No stridor. No respiratory distress. He has no wheezes. He exhibits tenderness (left lateral). He exhibits no laceration and no crepitus.    Abdominal: There is no tenderness.  Musculoskeletal: He exhibits tenderness.  Lymphadenopathy:    He has no cervical adenopathy.  Neurological: He is alert and oriented to person, place, and time. He has normal strength. He is not disoriented. He  displays no tremor. No cranial nerve deficit or sensory deficit. He exhibits normal muscle tone. He displays no seizure activity. Coordination and gait normal. GCS eye subscore is 4. GCS verbal subscore is 5. GCS motor subscore is 6.  Initial unsteadiness on feet. Later reassessment had normal gait to bathroom.   Skin: Capillary refill takes less than 2 seconds. He is not diaphoretic. No erythema. No pallor.  Psychiatric: He has a normal mood and affect.  Nursing note and vitals reviewed.    ED Treatments / Results  Labs (all labs ordered are listed, but only abnormal results are displayed) Labs Reviewed  CBC WITH DIFFERENTIAL/PLATELET - Abnormal; Notable for the following components:      Result Value   WBC 11.3 (*)    Neutro Abs 8.2 (*)    All other components within normal limits  COMPREHENSIVE METABOLIC PANEL - Abnormal; Notable for the following components:   Potassium 3.3 (*)    Glucose, Bld 100 (*)    ALT  15 (*)    All other components within normal limits  RAPID URINE DRUG SCREEN, HOSP PERFORMED - Abnormal; Notable for the following components:   Benzodiazepines POSITIVE (*)    Tetrahydrocannabinol POSITIVE (*)    All other components within normal limits  URINE CULTURE  MAGNESIUM  LIPASE, BLOOD  PROTIME-INR  TROPONIN I  URINALYSIS, ROUTINE W REFLEX MICROSCOPIC    EKG  EKG Interpretation  Date/Time:  Monday September 12 2017 13:58:24 EST Ventricular Rate:  77 PR Interval:    QRS Duration: 88 QT Interval:  379 QTC Calculation: 429 R Axis:   79 Text Interpretation:  Sinus rhythm When compared to prior, no significant changes seen.  No STEMI Confirmed by Theda Belfast ([id number]) on 09/12/2017 3:08:27 PM       Radiology Dg Chest 2 View  Result Date: 09/12/2017 CLINICAL DATA:  Pain following fall from ladder EXAM: CHEST  2 VIEW COMPARISON:  September 03, 2016 FINDINGS: Lungs are clear. Heart size and pulmonary vascularity are normal. No adenopathy. No pneumothorax.  No fractures are evident. There is postoperative change in the lower cervical spine. IMPRESSION: No edema or consolidation.  No evident pneumothorax. Electronically Signed   By: Bretta Bang III M.D.   On: 09/12/2017 13:56   Ct Head Wo Contrast  Result Date: 09/12/2017 CLINICAL DATA:  Larey Seat off ladder, neck and head pain EXAM: CT HEAD WITHOUT CONTRAST CT CERVICAL SPINE WITHOUT CONTRAST TECHNIQUE: Multidetector CT imaging of the head and cervical spine was performed following the standard protocol without intravenous contrast. Multiplanar CT image reconstructions of the cervical spine were also generated. COMPARISON:  None. FINDINGS: CT HEAD FINDINGS Brain: No evidence of acute infarction, hemorrhage, hydrocephalus, extra-axial collection or mass lesion/mass effect. Vascular: No hyperdense vessels. Scattered carotid artery calcifications. Skull: Normal. Negative for fracture or focal lesion. Sinuses/Orbits: No acute finding. Mild mucosal thickening in the ethmoid sinuses. Other: None CT CERVICAL SPINE FINDINGS Alignment: Straightening of the cervical spine. No subluxation. Facet alignment is within normal limits. Skull base and vertebrae: No acute fracture. No primary bone lesion or focal pathologic process. Soft tissues and spinal canal: No prevertebral fluid or swelling. No visible canal hematoma. Disc levels: Anterior plate and screw fixation at C6 and C7. Mild degenerative changes at C5-C6 and C4-C5. Upper chest: Negative. Other: None IMPRESSION: 1. No CT evidence for acute intracranial abnormality. 2. Straightening of the cervical spine with postsurgical changes at C6 and C7. No acute osseous abnormality. Electronically Signed   By: Jasmine Pang M.D.   On: 09/12/2017 14:07   Ct Cervical Spine Wo Contrast  Result Date: 09/12/2017 CLINICAL DATA:  Larey Seat off ladder, neck and head pain EXAM: CT HEAD WITHOUT CONTRAST CT CERVICAL SPINE WITHOUT CONTRAST TECHNIQUE: Multidetector CT imaging of the head and  cervical spine was performed following the standard protocol without intravenous contrast. Multiplanar CT image reconstructions of the cervical spine were also generated. COMPARISON:  None. FINDINGS: CT HEAD FINDINGS Brain: No evidence of acute infarction, hemorrhage, hydrocephalus, extra-axial collection or mass lesion/mass effect. Vascular: No hyperdense vessels. Scattered carotid artery calcifications. Skull: Normal. Negative for fracture or focal lesion. Sinuses/Orbits: No acute finding. Mild mucosal thickening in the ethmoid sinuses. Other: None CT CERVICAL SPINE FINDINGS Alignment: Straightening of the cervical spine. No subluxation. Facet alignment is within normal limits. Skull base and vertebrae: No acute fracture. No primary bone lesion or focal pathologic process. Soft tissues and spinal canal: No prevertebral fluid or swelling. No visible canal hematoma. Disc levels: Anterior plate and screw  fixation at C6 and C7. Mild degenerative changes at C5-C6 and C4-C5. Upper chest: Negative. Other: None IMPRESSION: 1. No CT evidence for acute intracranial abnormality. 2. Straightening of the cervical spine with postsurgical changes at C6 and C7. No acute osseous abnormality. Electronically Signed   By: Jasmine PangKim  Fujinaga M.D.   On: 09/12/2017 14:07    Procedures Procedures (including critical care time)  Medications Ordered in ED Medications  prochlorperazine (COMPAZINE) tablet 10 mg (10 mg Oral Given 09/12/17 1724)     Initial Impression / Assessment and Plan / ED Course  I have reviewed the triage vital signs and the nursing notes.  Pertinent labs & imaging results that were available during my care of the patient were reviewed by me and considered in my medical decision making (see chart for details).     Timothy ApoKevin Mcintosh is a 51 y.o. male with a past medical history significant for hypertension, hyperlipidemia, rheumatoid arthritis, and prior cervical spine surgery who presents from his primary  care physician's office for multiple complaints after a fall last week.  Patient is a coming by his wife.  Patient reports that on Wednesday, he accidentally took a dose of his trazodone.  He says that in the morning, he was very sleepy and drowsy.  He says that he went to work and used a ladder to change a light bulb.  Patient then had a syncopal episode fall to the floor.  Patient reports it was witnessed by a coworker but he could not provide description of what happened.  Patient woke up on the ground with pain in the back of his head and neck.  Patient also reports diffuse pain all over that is worse than chronic pains.  He says that since the fall he has been having pain in his posterior head and neck.  He denies nausea or vomiting.  He denies any diplopia or blurry vision.  He reports that he is disoriented at times and has been intermittent memory loss.  He denies any speech abnormalities.  He feels that his left face has increased sensitivity.  He denies any facial droop.  He denies any history of strokes.  He denies any pain in his mid or low back.  He does report some left lateral discomfort with deep breathing in the chest.  He has a chronic smoker cough but denies any production.  No fevers or chills.  No urinary symptoms.  No other changes in medications.  He says that he has been having these headaches ever since the fall.  Patient was seen by his PCP and sent down for evaluation of traumatic injury.  Next  On my exam, patient has symmetric pupils.  Normal extraocular movements.  No double vision.  No slurred speech.  Patient was oriented.  Patient had no facial droop.  Patient did report some hypersensitivity in the left face but no other numbness or tingling in the body.  Normal coordination with finger-nose-finger and normal strength in the arms and legs.  Patient felt unsteady with gait but did not fall.  Patient's lungs clear and back was nontender.  Bilateral paraspinal neck tenderness.   Patient can move his neck with only mild discomfort.  Abdomen was nontender.  Exam otherwise unremarkable.  Based on patient's description of symptoms and episode, suspect that his double dose of trazodone caused him to be fatigued and drowsy leading to a fall off a ladder.  Suspect postconcussive type episode causing his symptoms however due to continued symptoms,  patient will have CT imaging of the head and neck to look for bleed or abnormality.  Patient will have chest x-ray given the fall and the possible syncopal episode as well as the left lateral chest discomfort.  Patient will have laboratory testing to assess for organ dysfunction or other infection in the urine.  Anticipate reassessment after workup.  Diagnostic testing results seen above.  Overall laboratory testing grossly reassuring.  Mild hypokalemia.  No evidence of UTI.  Troponin negative.  CT of the head and neck showed no acute fractures or dislocation.  No evidence of pneumothorax or pneumonia.  No cardiomegaly.  EKG reassuring.  Patient was able to ambulate to the bathroom without significant difficulty on reassessment.  Patient felt better after observation in the emergency department.  Patient was suspect a postconcussive syndrome causing the patient's symptoms.  Patient given prescription and a dose of Compazine to help with nausea and headache.  Patient will follow up with neurology and his PCP.  Return precautions were given and understood.  Suspect patient took too much of his trazodone causing him to to have his fall several days ago.  Patient and family understood return precautions and patient discharged in good condition with improving symptoms.   Final Clinical Impressions(s) / ED Diagnoses   Final diagnoses:  Fall, initial encounter  Acute nonintractable headache, unspecified headache type  Concussion with loss of consciousness of 30 minutes or less, initial encounter    ED Discharge Orders        Ordered     prochlorperazine (COMPAZINE) 10 MG tablet  2 times daily PRN     09/12/17 1718      Clinical Impression: 1. Fall, initial encounter   2. Acute nonintractable headache, unspecified headache type   3. Concussion with loss of consciousness of 30 minutes or less, initial encounter     Disposition: Discharge  Condition: Good  I have discussed the results, Dx and Tx plan with the pt(& family if present). He/she/they expressed understanding and agree(s) with the plan. Discharge instructions discussed at great length. Strict return precautions discussed and pt &/or family have verbalized understanding of the instructions. No further questions at time of discharge.    This SmartLink is deprecated. Use AVSMEDLIST instead to display the medication list for a patient.  Follow Up: Pearline Cables, MD [address] [place] [address] [phone]     [facility] [address]     [address] [place] [address] [phone]       Michio Thier, Canary Brim, MD 09/12/17 2044

## 2017-09-12 NOTE — ED Notes (Signed)
Pt in radiology 

## 2017-09-12 NOTE — ED Triage Notes (Addendum)
Pt states he was sent from PCP-states he thought he may have taken extra dose trazadone 11/6- On 11/7 pt with unsteady, gait, "feeling drunk"-pt fell from a ladder with possible LOC-states he went back to work-pt states he has had increase in sleep and insomnia, change in mental status, "all over body hurts"-NAD-steady gait

## 2017-09-12 NOTE — ED Notes (Signed)
Updated on delay. No distress noted.

## 2017-09-12 NOTE — Progress Notes (Signed)
[facility] [address] [place], [place] [address] [phone]  Date:  09/12/2017   Name:  Timothy Mcintosh   DOB:  24-Apr-1966   MRN:  [id number]  PCP:  Pearline Cables, MD    Chief Complaint: Fall (Pt fell from ladder last week and not feeling like himself lately. )   History of Present Illness:  Afraz Hula is a 51 y.o. very pleasant male patient who presents with the following:  History of RA, dyslipidemia, tobacco use Here today with concern of falling off a ladder  I last saw him about a month ago for a routine follow-up visit He does take [name] for his RA  Today is Monday- this past Tuesday he felt a bit groggy, he was not sure why The next day he did not seem quite like himself. His wife felt like his speech was slurred He climbed an 8 foot ladder and fell to the ground.  He is not sure why he fell, he is not sure if he hit his head.  He is not sure if he had any LOC- he does not know, he did  He did not feel like he broke any bones or had any other acute injury.   He tried to go to work on Friday but he was not himself- he was having a hard time remembering, he has been sleeping quite a bit.   He notes that he slept for a couple of days nearly all day, and then last night he could not sleep.    He has noted a HA since the day that he fell He has noted a poor appetite  He has noted some loose stool but no vomiting No facial drooping He notes that he feels dizzy and off balance. He has a hard time bending down to pick things up off the floor.   He feels "just drunk, and I don't like it."  He has never had this happen before He also notes that he is more emotional than is normal for him, he feels teary easily No new meds or other major changes   No fever, no ST He does smoke so he has a cough He has felt very cold since he fell as well  His fall occurred this past Wednesday today is Monday.   He  notes that his father did fall from a tree and got hurt years ago,  They thought he might have had a stroke. He was never quite the   He does not have a neurologist, never had any reason to see one He does have a rheumatoligst who manages his RA  Here today with his wife [name] who contributes to the history   Patient Active Problem List   Diagnosis Date Noted  . Chest pain 09/03/2016  . Rheumatoid arthritis (HCC) 04/01/2016  . Dyslipidemia 12/25/2015  . Tobacco use disorder 12/25/2015  . Insomnia 12/25/2015    Past Medical History:  Diagnosis Date  . Hyperlipidemia   . Hypertension     Past Surgical History:  Procedure Laterality Date  . BACK SURGERY Bilateral    c5 -c6  . CARPAL TUNNEL RELEASE Right     Social History   Tobacco Use  . Smoking status: Current Every Day Smoker  . Smokeless tobacco: Never Used  . Tobacco comment: smoking since age 20  Substance Use Topics  . Alcohol use: No    Alcohol/week: 0.0  oz  . Drug use: No    Family History  Problem Relation Age of Onset  . High Cholesterol Father   . High Cholesterol Brother   . Heart failure Maternal Grandfather        Diet age 78 of "massive heart failure" unknown if MI or not    No Known Allergies  Medication list has been reviewed and updated.  Current Outpatient Medications on File Prior to Visit  Medication Sig Dispense Refill  . allopurinol (ZYLOPRIM) 300 MG tablet TAKE 1 TABLET (300 MG TOTAL) BY MOUTH DAILY 90 tablet 4  . leflunomide (ARAVA) 20 MG tablet Take 1 tablet (20 mg total) by mouth daily. 30 tablet 0  . lisinopril (PRINIVIL,ZESTRIL) 20 MG tablet Take 1 and 1/2 tablets by mouth daily for blood pressure, may increase to 2 tablets daily if needed. 60 tablet 1  . Multiple Vitamin (MULTIVITAMIN) capsule Take 1 capsule by mouth daily.     Marland Kitchen omeprazole (PRILOSEC) 40 MG capsule Take 1 capsule (40 mg total) by mouth 2 (two) times daily. 30 capsule 4  . Probiotic Product (ACIDOPHILUS/GOAT MILK)  CAPS Take 1 capsule by mouth daily.     . sildenafil (REVATIO) 20 MG tablet Take 20- 100 mg once daily as needed for ED 50 tablet 5  . simvastatin (ZOCOR) 40 MG tablet TAKE 1 TABLET BY MOUTH DAILY 90 tablet 2  . Tofacitinib Citrate (XELJANZ) 5 MG TABS Take 1 daily- per rheumatology 30 tablet 0  . traMADol (ULTRAM) 50 MG tablet TAKE ONE TABLET BY MOUTH EVERY 8 HOURS AS NEEDED FOR PAIN 30 tablet 1  . traZODone (DESYREL) 100 MG tablet Take 1 tablet (100 mg total) by mouth at bedtime as needed for sleep. 90 tablet 2  . buPROPion (WELLBUTRIN SR) 150 MG 12 hr tablet Take 1 tablet (150 mg total) by mouth 2 (two) times daily. (Patient not taking: Reported on 09/12/2017) 60 tablet 3   No current facility-administered medications on file prior to visit.     Review of Systems:  As per HPI- otherwise negative. He notes a persistent although not very severe HA  Physical Examination: Vitals:   09/12/17 1127  BP: (!) 168/91  Pulse: 97  Temp: 98 F (36.7 C)  SpO2: 100%   Vitals:   09/12/17 1127  Weight: 168 lb 12.8 oz (76.6 kg)  Height: 5' 7.5" (1.715 m)   Body mass index is 26.05 kg/m. Ideal Body Weight: Weight in (lb) to have BMI = 25: 161.7  GEN: WDWN, NAD, Non-toxic, A & O x 3 HEENT: Atraumatic, Normocephalic. Neck supple. No masses, No LAD. Ears and Nose: No external deformity. CV: RRR, No M/G/R. No JVD. No thrill. No extra heart sounds. PULM: CTA B, no wheezes, crackles, rhonchi. No retractions. No resp. distress. No accessory muscle use. ABD: S, NT, ND, +BS. No rebound. No HSM. EXTR: No c/c/e NEURO Normal gait.  Normal strength, sensation and DTR of all limbs, normal movement and sensation of face. He is wobbly on romberg testing  PSYCH: Normally interactive. Conversant. Not depressed or anxious appearing.  Calm demeanor.    Assessment and Plan: Fall from ladder, initial encounter  Here today following a fall from a ladder that occurred about 5 days ago. He is not sure why he  fell- if he had LOC, etc- and is not feeling himself.  He and his wife are quite concerned, would like to proceed to the ER downstairs for immediate further evaluation. Called EDP and gave  brief report, appreciate their care of this nice patient   Advised pt that for the time being he should not drive, be on ladders, in trees, etc and he states agreement   Signed Abbe Amsterdam, MD

## 2017-09-14 LAB — URINE CULTURE: CULTURE: NO GROWTH

## 2017-09-15 ENCOUNTER — Encounter: Payer: Self-pay | Admitting: Neurology

## 2017-09-15 ENCOUNTER — Other Ambulatory Visit: Payer: Self-pay

## 2017-09-15 ENCOUNTER — Encounter: Payer: Self-pay | Admitting: Family Medicine

## 2017-09-15 ENCOUNTER — Ambulatory Visit (INDEPENDENT_AMBULATORY_CARE_PROVIDER_SITE_OTHER): Payer: BLUE CROSS/BLUE SHIELD | Admitting: Neurology

## 2017-09-15 VITALS — BP 163/84 | HR 94 | Resp 16 | Ht 67.0 in | Wt 173.0 lb

## 2017-09-15 DIAGNOSIS — G2581 Restless legs syndrome: Secondary | ICD-10-CM

## 2017-09-15 DIAGNOSIS — M069 Rheumatoid arthritis, unspecified: Secondary | ICD-10-CM | POA: Diagnosis not present

## 2017-09-15 DIAGNOSIS — G4700 Insomnia, unspecified: Secondary | ICD-10-CM | POA: Diagnosis not present

## 2017-09-15 DIAGNOSIS — S0990XA Unspecified injury of head, initial encounter: Secondary | ICD-10-CM

## 2017-09-15 DIAGNOSIS — F0781 Postconcussional syndrome: Secondary | ICD-10-CM | POA: Diagnosis not present

## 2017-09-15 HISTORY — DX: Restless legs syndrome: G25.81

## 2017-09-15 HISTORY — DX: Postconcussional syndrome: F07.81

## 2017-09-15 MED ORDER — MELOXICAM 15 MG PO TABS: 15.0000 mg | ORAL_TABLET | Freq: Every day | ORAL | 5 refills | Status: DC

## 2017-09-15 MED ORDER — GABAPENTIN 300 MG PO CAPS: ORAL_CAPSULE | ORAL | 11 refills | Status: DC

## 2017-09-15 NOTE — Progress Notes (Signed)
[facility]  PATIENT: Timothy Mcintosh DOB: 03/18/1966  REFERRING DOCTOR OR PCP:  Timothy Mcintosh SOURCE: Patient, notes from ED, imaging reports, CT scan images on PACS.  _________________________________   HISTORICAL  CHIEF COMPLAINT:  Chief Complaint  Patient presents with  . Fall    Timothy Mcintosh is here with his wife Timothy Mcintosh. Sts. on 09/05/17, he fell 6-8 ft. from a ladder.  Believes he took a double dose of Trzodone and was groggy when he was climbing ladder.  Landed on concrete.  Does not remember the fall; is unsure if he hit his head.  Sts. fall was witnessed by an employee, and he had no reported loc. Hx. of C5-6 fusion.  Has seen pcp and CT head, c-spine and CXR were all ok.  Sts. has residual cognitive fogginess.  Increased anxiety, depression, which is managed by his pcp./fim    HISTORY OF PRESENT ILLNESS:  I had the pleasure of seeing your patient, Timothy Mcintosh, at [facility] for neurologic consultation regarding his recent head injury.  On 09/06/2017, he took a double dose of trazodone for his insomnia and felt groggy the next morning, 09/07/2017.    He climbed a ladder to change a light bulb that morning at work and fell.    He does not remember the fall but remembers the ladder on top of him.    A fellow employee felt that there was no LOC.    Since then, he has felt his cognition is not as clear and he is more anxious.     He had a headache in the occiput/neck for the first few days.     Since the incident, sleep has been poor.  He went to the ED 09/12/2017 and had a head and neck CT (no acute change).    Right now, he denies headache.   He has neck pain in the lower cervical spine.   He has some tingling in his hands at times but this pre-dated the fall.   He notes some delay in his cognition but is doing better than last week   He denies any weakness or change in his bladder function.  He had a C6C7 ACDF at [facility] in 2016.  He has RA and is  on [place].   He also haas RLS and this seems worse since the fall but also worsened the past 3-4 months since starting trazodone.   Marland Kitchen Restless leg syndrome started as a teenager. There is no family history. He has a lot of pain from the RA and was once on opiates (up to 120 mg oxycodone) and felt addicted so is trying to avoid these.   I personally reviewed the CT scan of the head and cervical spine dated 09/12/2017.   The CT scan of the head was normal. The CT scan of the cervical spine showed prior fusion at C6-C7. There are no acute findings.   455REVIEW OF SYSTEMS: Constitutional: No fevers, chills, sweats, or change in appetite.  He has insomnia and no shortness leg syndrome since she was young. Eyes: No visual changes, double vision, eye pain Ear, nose and throat: No hearing loss, ear pain, nasal congestion, sore throat Cardiovascular: No chest pain, palpitations Respiratory: No shortness of breath at rest or with exertion.   No wheezes GastrointestinaI: No nausea, vomiting, diarrhea, abdominal pain, fecal incontinence Genitourinary: No dysuria, urinary retention or frequency.  No nocturia. Musculoskeletal: Reported neck pain and back pain  Integumentary: No rash, pruritus,  skin lesions Neurological: as above Psychiatric: No depression at this time.  No anxiety Endocrine: No palpitations, diaphoresis, change in appetite, change in weigh or increased thirst Hematologic/Lymphatic: No anemia, purpura, petechiae. Allergic/Immunologic: No itchy/runny eyes, nasal congestion, recent allergic reactions, rashes  ALLERGIES: No Known Allergies  HOME MEDICATIONS:  Current Outpatient Medications:  .  allopurinol (ZYLOPRIM) 300 MG tablet, TAKE 1 TABLET (300 MG TOTAL) BY MOUTH DAILY, Disp: 90 tablet, Rfl: 4 .  buPROPion (WELLBUTRIN SR) 150 MG 12 hr tablet, Take 1 tablet (150 mg total) by mouth 2 (two) times daily., Disp: 60 tablet, Rfl: 3 .  leflunomide (ARAVA) 20 MG tablet, Take 1  tablet (20 mg total) by mouth daily., Disp: 30 tablet, Rfl: 0 .  lisinopril (PRINIVIL,ZESTRIL) 20 MG tablet, Take 1 and 1/2 tablets by mouth daily for blood pressure, may increase to 2 tablets daily if needed., Disp: 60 tablet, Rfl: 1 .  Multiple Vitamin (MULTIVITAMIN) capsule, Take 1 capsule by mouth daily. , Disp: , Rfl:  .  omeprazole (PRILOSEC) 40 MG capsule, Take 1 capsule (40 mg total) by mouth 2 (two) times daily., Disp: 30 capsule, Rfl: 4 .  Probiotic Product (ACIDOPHILUS/GOAT MILK) CAPS, Take 1 capsule by mouth daily. , Disp: , Rfl:  .  prochlorperazine (COMPAZINE) 10 MG tablet, Take 1 tablet (10 mg total) 2 (two) times daily as needed by mouth for nausea or vomiting., Disp: 10 tablet, Rfl: 0 .  sildenafil (REVATIO) 20 MG tablet, Take 20- 100 mg once daily as needed for ED, Disp: 50 tablet, Rfl: 5 .  simvastatin (ZOCOR) 40 MG tablet, TAKE 1 TABLET BY MOUTH DAILY, Disp: 90 tablet, Rfl: 2 .  Tofacitinib Citrate (XELJANZ) 5 MG TABS, Take 1 daily- per rheumatology, Disp: 30 tablet, Rfl: 0 .  traMADol (ULTRAM) 50 MG tablet, TAKE ONE TABLET BY MOUTH EVERY 8 HOURS AS NEEDED FOR PAIN, Disp: 30 tablet, Rfl: 1 .  traZODone (DESYREL) 100 MG tablet, Take 1 tablet (100 mg total) by mouth at bedtime as needed for sleep., Disp: 90 tablet, Rfl: 2 .  gabapentin (NEURONTIN) 300 MG capsule, Take one in the evening and 1 or 2 at bedtime, Disp: 90 capsule, Rfl: 11 .  meloxicam (MOBIC) 15 MG tablet, Take 1 tablet (15 mg total) daily by mouth., Disp: 30 tablet, Rfl: 5  PAST MEDICAL HISTORY: Past Medical History:  Diagnosis Date  . Headache   . Hyperlipidemia   . Hypertension   . Rheumatoid arthritis (HCC)   . Vision abnormalities     PAST SURGICAL HISTORY: Past Surgical History:  Procedure Laterality Date  . BACK SURGERY Bilateral    c5 -c6  . CARPAL TUNNEL RELEASE Right     FAMILY HISTORY: Family History  Problem Relation Age of Onset  . High Cholesterol Father   . Kidney Stones Father   .  High Cholesterol Brother   . Heart failure Maternal Grandfather        Diet age 76 of "massive heart failure" unknown if MI or not  . Lung cancer Mother     SOCIAL HISTORY:  Social History   Socioeconomic History  . Marital status: Married    Spouse name: Not on file  . Number of children: Not on file  . Years of education: Not on file  . Highest education level: Not on file  Social Needs  . Financial resource strain: Not on file  . Food insecurity - worry: Not on file  . Food insecurity - inability: Not on  file  . Transportation needs - medical: Not on file  . Transportation needs - non-medical: Not on file  Occupational History  . Not on file  Tobacco Use  . Smoking status: Current Every Day Smoker  . Smokeless tobacco: Never Used  . Tobacco comment: smoking since age 51  Substance and Sexual Activity  . Alcohol use: No    Alcohol/week: 0.0 oz  . Drug use: No  . Sexual activity: Not on file  Other Topics Concern  . Not on file  Social History Narrative  . Not on file     PHYSICAL EXAM  Vitals:   09/15/17 0944  BP: (!) 163/84  Pulse: 94  Resp: 16  Weight: 173 lb (78.5 kg)  Height: 5\' 7"  (1.702 m)    Body mass index is 27.1 kg/m.   General: The patient is well-developed and well-nourished and in no acute distress  Eyes:  Funduscopic exam shows normal optic discs and retinal vessels.  Neck: The neck is supple, no carotid bruits are noted.  Post-op lower cervical ACDF scar.  The neck is nontender.  Cardiovascular: The heart has a regular rate and rhythm with a normal S1 and S2. There were no murmurs, gallops or rubs. Lungs are clear to auscultation.  Skin: Extremities are without significant edema.  Musculoskeletal:  Back is nontender  Neurologic Exam  Mental status: The patient is alert and oriented x 3 at the time of the examination. The patient has apparent normal recent and remote memory, with an apparently normal attention span and concentration  ability.   Speech is normal.  Cranial nerves: Extraocular movements are full. Pupils are equal, round, and reactive to light and accomodation.  Visual fields are full.  Facial symmetry is present. There is good facial sensation to soft touch bilaterally.Facial strength is normal.  Trapezius and sternocleidomastoid strength is normal. No dysarthria is noted.  The tongue is midline, and the patient has symmetric elevation of the soft palate. No obvious hearing deficits are noted.  Motor:  Muscle bulk is normal.   Tone is normal. Strength is  5 / 5 in all 4 extremities.   Sensory: Sensory testing is intact to pinprick, soft touch and vibration sensation in the arms.   Reduced right L5 sensation to vibration and altered to touch.  Coordination: Cerebellar testing reveals good finger-nose-finger and heel-to-shin bilaterally.  Gait and station: Station is normal.   Gait is normal. Tandem gait is normal. Romberg is negative.   Reflexes: Deep tendon reflexes are symmetric and normal bilaterally.   Plantar responses are flexor.    DIAGNOSTIC DATA (LABS, IMAGING, TESTING) - I reviewed patient records, labs, notes, testing and imaging myself where available.  Lab Results  Component Value Date   WBC 11.3 (H) 09/12/2017   HGB 14.1 09/12/2017   HCT 41.2 09/12/2017   MCV 86.9 09/12/2017   PLT 306 09/12/2017      Component Value Date/Time   NA 138 09/12/2017 1357   K 3.3 (L) 09/12/2017 1357   CL 108 09/12/2017 1357   CO2 22 09/12/2017 1357   GLUCOSE 100 (H) 09/12/2017 1357   BUN 10 09/12/2017 1357   CREATININE 0.67 09/12/2017 1357   CALCIUM 9.1 09/12/2017 1357   PROT 7.1 09/12/2017 1357   ALBUMIN 4.6 09/12/2017 1357   AST 19 09/12/2017 1357   ALT 15 (L) 09/12/2017 1357   ALKPHOS 99 09/12/2017 1357   BILITOT 0.5 09/12/2017 1357   GFRNONAA >60 09/12/2017 1357   GFRAA >  60 09/12/2017 1357   Lab Results  Component Value Date   CHOL 123 12/09/2016   HDL 37.10 (L) 12/09/2016   LDLCALC 62  12/09/2016   TRIG 120.0 12/09/2016   CHOLHDL 3 12/09/2016   Lab Results  Component Value Date   HGBA1C 5.3 12/25/2015   No results found for: VITAMINB12 No results found for: TSH     ASSESSMENT AND PLAN  Rheumatoid arthritis, involving unspecified site, unspecified rheumatoid factor presence (HCC)  Post concussion syndrome  Injury of head, initial encounter  Insomnia, unspecified type  Restless leg syndrome   In summary, [name] is a 51 year old man with a mild postconcussive syndrome after he fell off a ladder last week. Over the past few days he has improved but is not at his baseline. He continues to have some cognitive followed and pain in the neck. The headache is better.   As he is improving, we discussed that he should continue to improve over the next couple of weeks, hopefully back to his baseline. He does have several issues that may improve with different treatments. To help with the sleep and restless leg syndrome, gabapentin will be started and titrated as needed. If this is ineffective, Requip could also be added.   I also placed him on meloxicam for his arthritic pain. He should stop if he gets any stomach upset.  He will return to see me as needed and is advised to call if he has new or worsening neurologic symptoms or if the restless legs and insomnia did not improve so that we can try a different therapy.  Thank you for asking me to see [name]. Please let me know if I can be of further assistance with her or other patients in the future per  Richard A. Epimenio Foot, MD, [facility] 09/15/2017, 5:05 PM Certified in Neurology, Clinical Neurophysiology, Sleep Medicine, Pain Medicine and Neuroimaging  [facility] [address], [address] [place], [place] [address] [phone]

## 2017-09-19 ENCOUNTER — Encounter: Payer: Self-pay | Admitting: Neurology

## 2017-10-09 ENCOUNTER — Other Ambulatory Visit: Payer: Self-pay | Admitting: Family Medicine

## 2017-10-09 DIAGNOSIS — I10 Essential (primary) hypertension: Secondary | ICD-10-CM

## 2017-11-03 ENCOUNTER — Other Ambulatory Visit: Payer: Self-pay | Admitting: Family Medicine

## 2017-11-03 DIAGNOSIS — Z720 Tobacco use: Secondary | ICD-10-CM

## 2017-12-23 ENCOUNTER — Other Ambulatory Visit: Payer: Self-pay | Admitting: Family Medicine

## 2018-03-22 NOTE — Progress Notes (Addendum)
[facility] [address], [place], [place] [address] [phone] 1965  Date:  03/23/2018   Name:  Timothy Mcintosh   DOB:  08-15-1966   MRN:  [id number]  PCP:  Darreld Mclean, MD    Chief Complaint: BRCA Mutation (both sisters have been diagnoseed with breast cancer)   History of Present Illness:  Timothy Mcintosh is a 52 y.o. very pleasant male patient who presents with the following:  History of RA, dyslipidemia, insomnia, HTN He fell off a ladder and hit his head last fall- he is fully recovered,no headaches  Due for routine labs today Rheumatology- Amil Amen   Both sisters dx with breast cancer - one just recently Also there is a male relative- his paternal GF- who had breast cancer, as well as his mom He was told that he needs to be tested for the sake of his daughters in case he is a genetic carrier  His joints are "up and down," but he feels like he is as good as he has been in years.  He is on [employer] operational [employer] for his joints  No gout sx recently No GI symptoms, he does take prilosec daily, occasionally BID  He feels like his mood is good, and is sleeping well also   He is fasting today for labs  He is sleeping well with trazodone He is no longer using the wellbutrin  He is also going to a suboxone and "wellness" clinic and is being treated by them for back pain I was going to refill his tramadol but then checked [place]- pt had forgotten to mention this  He has been going to this clinic for about 4 months now He does feel like this is helping with his back pain  Patient Active Problem List   Diagnosis Date Noted  . Post concussion syndrome 09/15/2017  . Head injury 09/15/2017  . Restless leg syndrome 09/15/2017  . Chest pain 09/03/2016  . Rheumatoid arthritis ([place]) 04/01/2016  . Dyslipidemia 12/25/2015  . Tobacco use disorder 12/25/2015  . Insomnia 12/25/2015    Past Medical History:  Diagnosis  Date  . Headache   . Hyperlipidemia   . Hypertension   . Rheumatoid arthritis ([place])   . Vision abnormalities     Past Surgical History:  Procedure Laterality Date  . BACK SURGERY Bilateral    c5 -c6  . CARPAL TUNNEL RELEASE Right     Social History   Tobacco Use  . Smoking status: Current Every Day Smoker  . Smokeless tobacco: Never Used  . Tobacco comment: smoking since age 50  Substance Use Topics  . Alcohol use: No    Alcohol/week: 0.0 oz  . Drug use: No    Family History  Problem Relation Age of Onset  . High Cholesterol Father   . Kidney Stones Father   . High Cholesterol Brother   . Heart failure Maternal Grandfather        Diet age 48 of "massive heart failure" unknown if MI or not  . Lung cancer Mother     No Known Allergies  Medication list has been reviewed and updated.  Current Outpatient Medications on File Prior to Visit  Medication Sig Dispense Refill  . allopurinol (ZYLOPRIM) 300 MG tablet TAKE 1 TABLET (300 MG TOTAL) BY MOUTH DAILY 90 tablet 4  . buPROPion (WELLBUTRIN SR) 150 MG 12 hr tablet TAKE ONE TABLET TWO TIMES A DAY 180 tablet  2  . gabapentin (NEURONTIN) 300 MG capsule Take one in the evening and 1 or 2 at bedtime 90 capsule 11  . leflunomide (ARAVA) 20 MG tablet Take 1 tablet (20 mg total) by mouth daily. 30 tablet 0  . lisinopril (PRINIVIL,ZESTRIL) 20 MG tablet TAKE 1 AND 1/2 TABLET BY MOUTH DAILY FOR BLOOD PRESSURE, MAY INCREASE TO 2 TABLETS DAILY IF NEEDED 180 tablet 2  . meloxicam (MOBIC) 15 MG tablet Take 1 tablet (15 mg total) daily by mouth. 30 tablet 5  . Multiple Vitamin (MULTIVITAMIN) capsule Take 1 capsule by mouth daily.     Marland Kitchen omeprazole (PRILOSEC) 40 MG capsule Take 1 capsule (40 mg total) by mouth daily. 90 capsule 1  . Probiotic Product (ACIDOPHILUS/GOAT MILK) CAPS Take 1 capsule by mouth daily.     . prochlorperazine (COMPAZINE) 10 MG tablet Take 1 tablet (10 mg total) 2 (two) times daily as needed by mouth for nausea or  vomiting. 10 tablet 0  . sildenafil (REVATIO) 20 MG tablet Take 20- 100 mg once daily as needed for ED 50 tablet 5  . simvastatin (ZOCOR) 40 MG tablet Take 1 tablet (40 mg total) by mouth daily. 90 tablet 2  . Tofacitinib Citrate (XELJANZ) 5 MG TABS Take 1 daily- per rheumatology 30 tablet 0  . traMADol (ULTRAM) 50 MG tablet TAKE ONE TABLET BY MOUTH EVERY 8 HOURS AS NEEDED FOR PAIN 30 tablet 1  . traZODone (DESYREL) 100 MG tablet Take 1 tablet (100 mg total) by mouth at bedtime as needed for sleep. 90 tablet 2   No current facility-administered medications on file prior to visit.     Review of Systems:  As per HPI- otherwise negative.   Physical Examination: Vitals:   03/23/18 0828  BP: 122/72  Pulse: 92  Resp: 16  Temp: 98.3 F (36.8 C)  SpO2: 98%   Vitals:   03/23/18 0828  Weight: 165 lb (74.8 kg)  Height: 5' 7"  (1.702 m)   Body mass index is 25.84 kg/m. Ideal Body Weight: Weight in (lb) to have BMI = 25: 159.3  GEN: WDWN, NAD, Non-toxic, A & O x 3, normal weight, smoker, looks well  HEENT: Atraumatic, Normocephalic. Neck supple. No masses, No LAD.  Bilateral TM wnl, oropharynx normal.  PEERL,EOMI.   Ears and Nose: No external deformity. CV: RRR, No M/G/R. No JVD. No thrill. No extra heart sounds. PULM: CTA B, no wheezes, crackles, rhonchi. No retractions. No resp. distress. No accessory muscle use. ABD: S, NT, ND, +BS. No rebound. No HSM. EXTR: No c/c/e NEURO Normal gait.  PSYCH: Normally interactive. Conversant. Not depressed or anxious appearing.  Calm demeanor.    Assessment and Plan: Medication monitoring encounter - Plan: Comprehensive metabolic panel  Essential hypertension - Plan: CBC, Comprehensive metabolic panel  Rheumatoid arthritis, involving unspecified site, unspecified rheumatoid factor presence (HCC)  Dyslipidemia - Plan: Lipid panel, simvastatin (ZOCOR) 40 MG tablet  Screening for prostate cancer - Plan: PSA  Screening for diabetes mellitus  - Plan: Comprehensive metabolic panel, Hemoglobin A1c  Accelerated hypertension - Plan: lisinopril (PRINIVIL,ZESTRIL) 20 MG tablet  Erectile dysfunction, unspecified erectile dysfunction type - Plan: sildenafil (REVATIO) 20 MG tablet  Muscle pain - Plan: DISCONTINUED: traMADol (ULTRAM) 50 MG tablet  Arthralgia, unspecified joint - Plan: DISCONTINUED: traMADol (ULTRAM) 50 MG tablet  Family history of breast cancer - Plan: Ambulatory referral to Genetics  Primary insomnia - Plan: traZODone (DESYREL) 100 MG tablet  Gout, unspecified cause, unspecified chronicity, unspecified site -  Plan: allopurinol (ZYLOPRIM) 300 MG tablet  Gastroesophageal reflux disease, esophagitis presence not specified - Plan: omeprazole (PRILOSEC) 40 MG capsule  Labs and refill as above today Continue to encourage him to stop smoking rx tramadol but then CANCELED rx Referral to medical genetics for his family history of breast cancer  Signed Lamar Blinks, MD  Message to pt-   Your labs overall look great-  Blood count is normal Metabolic profile is normal A1c (average blood sugar) is normal Cholesterol is overall good PSA is normal, but slight increase from a year ago.  Let's repeat in one year for sure  Lab Results      Component                Value               Date                      PSA                      0.72                03/23/2018                PSA                      0.50                12/09/2016    Results for orders placed or performed in visit on 03/23/18  CBC  Result Value Ref Range   WBC 9.4 4.0 - 10.5 K/uL   RBC 4.63 4.22 - 5.81 Mil/uL   Platelets 290.0 150.0 - 400.0 K/uL   Hemoglobin 13.4 13.0 - 17.0 g/dL   HCT 40.6 39.0 - 52.0 %   MCV 87.8 78.0 - 100.0 fl   MCHC 33.1 30.0 - 36.0 g/dL   RDW 14.3 11.5 - 15.5 %  Comprehensive metabolic panel  Result Value Ref Range   Sodium 139 135 - 145 mEq/L   Potassium 4.8 3.5 - 5.1 mEq/L   Chloride 104 96 - 112 mEq/L   CO2 29  19 - 32 mEq/L   Glucose, Bld 69 (L) 70 - 99 mg/dL   BUN 11 6 - 23 mg/dL   Creatinine, Ser 0.82 0.40 - 1.50 mg/dL   Total Bilirubin 0.2 0.2 - 1.2 mg/dL   Alkaline Phosphatase 116 39 - 117 U/L   AST 16 0 - 37 U/L   ALT 13 0 - 53 U/L   Total Protein 6.1 6.0 - 8.3 g/dL   Albumin 4.1 3.5 - 5.2 g/dL   Calcium 9.5 8.4 - 10.5 mg/dL   GFR 104.42 >60.00 mL/min  Hemoglobin A1c  Result Value Ref Range   Hgb A1c MFr Bld 5.6 4.6 - 6.5 %  Lipid panel  Result Value Ref Range   Cholesterol 121 0 - 200 mg/dL   Triglycerides 204.0 (H) 0.0 - 149.0 mg/dL   HDL 33.80 (L) >39.00 mg/dL   VLDL 40.8 (H) 0.0 - 40.0 mg/dL   Total CHOL/HDL Ratio 4    NonHDL 87.40   PSA  Result Value Ref Range   PSA 0.72 0.10 - 4.00 ng/mL  LDL cholesterol, direct  Result Value Ref Range   Direct LDL 67.0 mg/dL

## 2018-03-23 ENCOUNTER — Encounter: Payer: Self-pay | Admitting: Genetics

## 2018-03-23 ENCOUNTER — Telehealth: Payer: Self-pay

## 2018-03-23 ENCOUNTER — Encounter: Payer: Self-pay | Admitting: Family Medicine

## 2018-03-23 ENCOUNTER — Ambulatory Visit: Payer: BLUE CROSS/BLUE SHIELD | Admitting: Family Medicine

## 2018-03-23 ENCOUNTER — Telehealth: Payer: Self-pay | Admitting: Genetics

## 2018-03-23 VITALS — BP 122/72 | HR 92 | Temp 98.3°F | Resp 16 | Ht 67.0 in | Wt 165.0 lb

## 2018-03-23 DIAGNOSIS — N529 Male erectile dysfunction, unspecified: Secondary | ICD-10-CM

## 2018-03-23 DIAGNOSIS — I10 Essential (primary) hypertension: Secondary | ICD-10-CM

## 2018-03-23 DIAGNOSIS — E785 Hyperlipidemia, unspecified: Secondary | ICD-10-CM | POA: Diagnosis not present

## 2018-03-23 DIAGNOSIS — M109 Gout, unspecified: Secondary | ICD-10-CM | POA: Diagnosis not present

## 2018-03-23 DIAGNOSIS — M069 Rheumatoid arthritis, unspecified: Secondary | ICD-10-CM

## 2018-03-23 DIAGNOSIS — K219 Gastro-esophageal reflux disease without esophagitis: Secondary | ICD-10-CM

## 2018-03-23 DIAGNOSIS — F5101 Primary insomnia: Secondary | ICD-10-CM | POA: Diagnosis not present

## 2018-03-23 DIAGNOSIS — Z5181 Encounter for therapeutic drug level monitoring: Secondary | ICD-10-CM | POA: Diagnosis not present

## 2018-03-23 DIAGNOSIS — Z125 Encounter for screening for malignant neoplasm of prostate: Secondary | ICD-10-CM | POA: Diagnosis not present

## 2018-03-23 DIAGNOSIS — Z803 Family history of malignant neoplasm of breast: Secondary | ICD-10-CM

## 2018-03-23 DIAGNOSIS — Z131 Encounter for screening for diabetes mellitus: Secondary | ICD-10-CM | POA: Diagnosis not present

## 2018-03-23 LAB — CBC
HCT: 40.6 % (ref 39.0–52.0)
Hemoglobin: 13.4 g/dL (ref 13.0–17.0)
MCHC: 33.1 g/dL (ref 30.0–36.0)
MCV: 87.8 fl (ref 78.0–100.0)
Platelets: 290 10*3/uL (ref 150.0–400.0)
RBC: 4.63 Mil/uL (ref 4.22–5.81)
RDW: 14.3 % (ref 11.5–15.5)
WBC: 9.4 10*3/uL (ref 4.0–10.5)

## 2018-03-23 LAB — COMPREHENSIVE METABOLIC PANEL
ALT: 13 U/L (ref 0–53)
AST: 16 U/L (ref 0–37)
Albumin: 4.1 g/dL (ref 3.5–5.2)
Alkaline Phosphatase: 116 U/L (ref 39–117)
BILIRUBIN TOTAL: 0.2 mg/dL (ref 0.2–1.2)
BUN: 11 mg/dL (ref 6–23)
CALCIUM: 9.5 mg/dL (ref 8.4–10.5)
CO2: 29 meq/L (ref 19–32)
CREATININE: 0.82 mg/dL (ref 0.40–1.50)
Chloride: 104 mEq/L (ref 96–112)
GFR: 104.42 mL/min (ref >60.00)
GLUCOSE: 69 mg/dL — AB (ref 70–99)
Potassium: 4.8 mEq/L (ref 3.5–5.1)
Sodium: 139 mEq/L (ref 135–145)
Total Protein: 6.1 g/dL (ref 6.0–8.3)

## 2018-03-23 LAB — LIPID PANEL
CHOL/HDL RATIO: 4
CHOLESTEROL: 121 mg/dL (ref 0–200)
HDL: 33.8 mg/dL — ABNORMAL LOW (ref >39.00)
NONHDL: 87.4
TRIGLYCERIDES: 204 mg/dL — AB (ref 0.0–149.0)
VLDL: 40.8 mg/dL — AB (ref 0.0–40.0)

## 2018-03-23 LAB — HEMOGLOBIN A1C: Hgb A1c MFr Bld: 5.6 % (ref 4.6–6.5)

## 2018-03-23 LAB — PSA: PSA: 0.72 ng/mL (ref 0.10–4.00)

## 2018-03-23 LAB — LDL CHOLESTEROL, DIRECT: LDL DIRECT: 67 mg/dL

## 2018-03-23 MED ORDER — LISINOPRIL 20 MG PO TABS: ORAL_TABLET | ORAL | 3 refills | Status: DC

## 2018-03-23 MED ORDER — TRAZODONE HCL 100 MG PO TABS: 100.0000 mg | ORAL_TABLET | Freq: Every evening | ORAL | 3 refills | Status: DC | PRN

## 2018-03-23 MED ORDER — TRAMADOL HCL 50 MG PO TABS: 50.0000 mg | ORAL_TABLET | Freq: Three times a day (TID) | ORAL | 1 refills | Status: DC | PRN

## 2018-03-23 MED ORDER — OMEPRAZOLE 40 MG PO CPDR: 40.0000 mg | DELAYED_RELEASE_CAPSULE | Freq: Every day | ORAL | 3 refills | Status: DC

## 2018-03-23 MED ORDER — SIMVASTATIN 40 MG PO TABS: 40.0000 mg | ORAL_TABLET | Freq: Every day | ORAL | 3 refills | Status: DC

## 2018-03-23 MED ORDER — SILDENAFIL CITRATE 20 MG PO TABS: ORAL_TABLET | ORAL | 5 refills | Status: DC

## 2018-03-23 MED ORDER — ALLOPURINOL 300 MG PO TABS: 300.0000 mg | ORAL_TABLET | Freq: Every day | ORAL | 4 refills | Status: DC

## 2018-03-23 NOTE — Patient Instructions (Signed)
Good to see you today-  I will be in touch with your labs and will set you up with genetics to discus your genetic risk of breast cancer  Take care and please see me in about 6 months Continue to work on quitting smoking

## 2018-03-23 NOTE — Telephone Encounter (Signed)
Genetic counseling referral from Dr. Lorelei Pont for brca gene. Pt has been scheduled to see Ferol Luz on 7/9 at 1pm. Pt aware to arrive 30 minutes early. Letter mailed.

## 2018-03-23 NOTE — Telephone Encounter (Signed)
Aware, pt told me. rx canceled

## 2018-03-23 NOTE — Telephone Encounter (Signed)
Copied from CRM [id number]. Topic: General - Other >> Mar 23, 2018  9:34 AM Marylen Ponto wrote: Reason for CRM: Will with [facility] states pt is on Suboxone and he would like Dr. Patsy Lager to be aware. Requested a return call.

## 2018-04-10 DIAGNOSIS — D72829 Elevated white blood cell count, unspecified: Secondary | ICD-10-CM | POA: Diagnosis not present

## 2018-04-10 DIAGNOSIS — J45909 Unspecified asthma, uncomplicated: Secondary | ICD-10-CM | POA: Diagnosis not present

## 2018-04-10 DIAGNOSIS — R6884 Jaw pain: Secondary | ICD-10-CM | POA: Diagnosis not present

## 2018-04-10 DIAGNOSIS — I10 Essential (primary) hypertension: Secondary | ICD-10-CM | POA: Diagnosis not present

## 2018-04-10 DIAGNOSIS — R531 Weakness: Secondary | ICD-10-CM | POA: Insufficient documentation

## 2018-04-10 DIAGNOSIS — R0602 Shortness of breath: Secondary | ICD-10-CM | POA: Diagnosis not present

## 2018-04-10 DIAGNOSIS — Z79899 Other long term (current) drug therapy: Secondary | ICD-10-CM | POA: Diagnosis not present

## 2018-04-10 DIAGNOSIS — M109 Gout, unspecified: Secondary | ICD-10-CM | POA: Diagnosis not present

## 2018-04-10 DIAGNOSIS — R42 Dizziness and giddiness: Secondary | ICD-10-CM | POA: Diagnosis not present

## 2018-04-10 DIAGNOSIS — R0600 Dyspnea, unspecified: Secondary | ICD-10-CM | POA: Diagnosis not present

## 2018-04-10 DIAGNOSIS — R0689 Other abnormalities of breathing: Secondary | ICD-10-CM | POA: Diagnosis not present

## 2018-04-10 DIAGNOSIS — E785 Hyperlipidemia, unspecified: Secondary | ICD-10-CM | POA: Diagnosis not present

## 2018-04-10 DIAGNOSIS — R61 Generalized hyperhidrosis: Secondary | ICD-10-CM | POA: Diagnosis not present

## 2018-04-10 DIAGNOSIS — K219 Gastro-esophageal reflux disease without esophagitis: Secondary | ICD-10-CM | POA: Diagnosis not present

## 2018-04-10 DIAGNOSIS — F411 Generalized anxiety disorder: Secondary | ICD-10-CM | POA: Diagnosis not present

## 2018-04-10 DIAGNOSIS — R0902 Hypoxemia: Secondary | ICD-10-CM | POA: Diagnosis not present

## 2018-04-10 DIAGNOSIS — M542 Cervicalgia: Secondary | ICD-10-CM | POA: Diagnosis not present

## 2018-04-10 DIAGNOSIS — G8929 Other chronic pain: Secondary | ICD-10-CM | POA: Diagnosis not present

## 2018-04-10 DIAGNOSIS — M549 Dorsalgia, unspecified: Secondary | ICD-10-CM | POA: Diagnosis not present

## 2018-04-10 DIAGNOSIS — M069 Rheumatoid arthritis, unspecified: Secondary | ICD-10-CM | POA: Diagnosis not present

## 2018-04-11 DIAGNOSIS — M542 Cervicalgia: Secondary | ICD-10-CM | POA: Diagnosis not present

## 2018-04-11 DIAGNOSIS — R6884 Jaw pain: Secondary | ICD-10-CM | POA: Diagnosis not present

## 2018-04-11 DIAGNOSIS — I499 Cardiac arrhythmia, unspecified: Secondary | ICD-10-CM | POA: Diagnosis not present

## 2018-04-20 ENCOUNTER — Encounter: Payer: Self-pay | Admitting: Family Medicine

## 2018-04-20 DIAGNOSIS — M1A09X0 Idiopathic chronic gout, multiple sites, without tophus (tophi): Secondary | ICD-10-CM | POA: Diagnosis not present

## 2018-04-20 DIAGNOSIS — M0579 Rheumatoid arthritis with rheumatoid factor of multiple sites without organ or systems involvement: Secondary | ICD-10-CM | POA: Diagnosis not present

## 2018-04-22 NOTE — Progress Notes (Signed)
[facility] [address], [address] [place], [place] [address] [phone]  Date:  04/24/2018   Name:  Timothy Mcintosh   DOB:  1966/07/10   MRN:  [id number]  PCP:  Pearline Cables, MD    Chief Complaint: ER follow up (couple weeks ago, after yardwork felt dizzy,lightheaded, felt like he was choking, sweating, given fluids-felt much better)   History of Present Illness:  Timothy Mcintosh is a 52 y.o. very pleasant male patient who presents with the following:  History of RA, dyslipidemia He had contacted me via mychart about a concerning incident that occurred recently, and I asked him to come and see me   Message from 6/20: Good morning Dr. Patsy Lager, I recently had to make a trip to the emergency room for these reasons: I had been working outside for 30 minutes or less and started sweating excessively and felt dizzy. I laid down inside and it continued to get worse. My wife in the meantime had called for an ambulance. Upon arriving at the hospital, they were able to get everything under control. They of course wanted to run several tests, and I, being a stubborn old man, opted to go home. I realize that was a very poor decision and wanted to make you aware of it. I also realize that I probably need to have some type of testing done to insure that this isn't a bigger problem than I am ignoring. Please let me know your thoughts, and if I need to make an appointment with you to proceed. Thank you Dr. Patsy Lager  Pt reports that 2 weeks ago he was doing some work outdoors for about 30 minutes or so. He did not overdue it in the yard, but admits that during the day he did not have much water, mostly just sodas.  He was sweating and felt lightheaded.  He went indoors and got some powerade.  His wife tried to cool him down with wet cloths.  He continued to get worse instead of better until his wife called EMS. He is not really sure if he had LOC, but  notes that he cannot remember the ambulance ride all that well. Apparently the EMS team started driving normally but "turned on lights and sirens" part of the way to the hospital.  He recalls feeling "like something was standing on his throat."  He went to [facility]-they had wanted to admit him and do a treadmill but he ended up going home the next day.    Following his hospital visit he had a cough and ST- however this did resolve and now he feels fine He is on RA meds - arava- and he was not sure if he needed to stop taking this or not during his recent [facility] notes:  Timothy Mcintosh is a 52 y.o. male with PMHx as reviewed in the EMR that presented to [facility] with sudden onset of shortness of breath jaw pain and neck pain while working in the yard. He has significant PMH with hypertension, hyperlipidemia, RA/gout, chronic back pain GAD, GERD. He reports he had negative nuclear stress test about a year back. Patient thought it might be because he was dehydrated drank some Gatorade but did not feel better. He was having dizziness, he continued to perfuse sweat and has generalized weakness and assocaited neck tightness radiating to his jaw. 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 Patient denies any chest pain, leg weakness, abdomen pain, nausea, vomiting, cough, fever, chills, dysuria or urinary frequency. He admitted using marijuana. In the ER vitals were stable, labs showed leukocytosis, point-of-care troponin negative. EKG personally reviewed shows normal sinus rhythm nonspecific T wave abnormalities. Per ER doctor iven his high HART score of 5 and he is being admitted to rule out acute coronary syndrome.  Arlys John reports that once he was in the hospital room he really felt fine and wanted to leave so he did.  He realizes this was not the best idea and reports that his wife was really annoyed with him.  He reports that the inpt team had planned to do a stress test  for him the next day.  He did have a nuclear stress 11/17 and did well  He feels like his exercise tolerance is good, no CP or SOB He walks about 5 miles a day in the course of his job- he has not had any chest pain or SOB, and the sx noted above have not recurred He did have a head injury in the fall after falling from a ladder and went to see neurology for a consultation.  He feels like his concussion sx are now resolved.   In summary, [name] is a 52 year old man with a mild postconcussive syndrome after he fell off a ladder last week. Over the past few days he has improved but is not at his baseline. He continues to have some cognitive followed and pain in the neck. The headache is better.   As he is improving, we discussed that he should continue to improve over the next couple of weeks, hopefully back to his baseline. He does have several issues that may improve with different treatments. To help with the sleep and restless leg syndrome, gabapentin will be started and titrated as needed. If this is ineffective, Requip could also be added.   I also placed him on meloxicam for his arthritic pain. He should stop if he gets any stomach upset.  Dr. Dierdre Forth is his rheumatologist His joints are "up and down," but he feels like he is as good as he has been in years.  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 Post concussion syndrome 09/15/2017  . Head injury 09/15/2017  . Restless leg syndrome 09/15/2017  . Chest pain 09/03/2016  . Rheumatoid arthritis (HCC) 04/01/2016  . Dyslipidemia 12/25/2015  . Tobacco use disorder 12/25/2015  . Insomnia 12/25/2015    Past Medical History:  Diagnosis Date  . Headache   . Hyperlipidemia   . Hypertension   . Rheumatoid arthritis (HCC)   . Vision  abnormalities     Past Surgical History:  Procedure Laterality Date  . BACK SURGERY Bilateral    c5 -c6  . CARPAL TUNNEL RELEASE Right     Social History   Tobacco Use  . Smoking status: Current Every Day Smoker  . Smokeless tobacco: Never Used  . Tobacco comment: smoking since age 24  Substance Use Topics  . Alcohol use: No    Alcohol/week: 0.0 oz  . Drug use: No    Family History  Problem Relation Age of Onset  . High Cholesterol Father   . Kidney  Stones Father   . High Cholesterol Brother   . Heart failure Maternal Grandfather        Diet age 1 of "massive heart failure" unknown if MI or not  . Lung cancer Mother     No Known Allergies  Medication list has been reviewed and updated.  Current Outpatient Medications on File Prior to Visit  Medication Sig Dispense Refill  . allopurinol (ZYLOPRIM) 300 MG tablet Take 1 tablet (300 mg total) by mouth daily. 90 tablet 4  . gabapentin (NEURONTIN) 300 MG capsule Take one in the evening and 1 or 2 at bedtime 90 capsule 11  . leflunomide (ARAVA) 20 MG tablet Take 1 tablet (20 mg total) by mouth daily. 30 tablet 0  . lisinopril (PRINIVIL,ZESTRIL) 20 MG tablet Take 1 po daily for HTN 90 tablet 3  . meloxicam (MOBIC) 15 MG tablet Take 1 tablet (15 mg total) daily by mouth. 30 tablet 5  . Multiple Vitamin (MULTIVITAMIN) capsule Take 1 capsule by mouth daily.     Marland Kitchen omeprazole (PRILOSEC) 40 MG capsule Take 1 capsule (40 mg total) by mouth daily. 90 capsule 3  . Probiotic Product (ACIDOPHILUS/GOAT MILK) CAPS Take 1 capsule by mouth daily.     . sildenafil (REVATIO) 20 MG tablet Take 20- 100 mg once daily as needed for ED 50 tablet 5  . simvastatin (ZOCOR) 40 MG tablet Take 1 tablet (40 mg total) by mouth daily. 90 tablet 3  . Tofacitinib Citrate (XELJANZ) 5 MG TABS Take 1 daily- per rheumatology 30 tablet 0  . traZODone (DESYREL) 100 MG tablet Take 1 tablet (100 mg total) by mouth at bedtime as needed for sleep. 90 tablet 3  .  UNABLE TO FIND Med Name: CBD oil     No current facility-administered medications on file prior to visit.     Review of Systems: As per HPI- otherwise negative.   Physical Examination: Vitals:   04/24/18 1045  BP: (!) 146/80  Pulse: 87  Resp: 16  SpO2: 97%   Vitals:   04/24/18 1045  Weight: 161 lb (73 kg)  Height: 5\' 7"  (1.702 m)   Body mass index is 25.22 kg/m. Ideal Body Weight: Weight in (lb) to have BMI = 25: 159.3  GEN: WDWN, NAD, Non-toxic, A & O x 3, looks well, he is a smoker HEENT: Atraumatic, Normocephalic. Neck supple. No masses, No LAD.  Bilateral TM wnl, oropharynx normal.  PEERL,EOMI.   Ears and Nose: No external deformity. CV: RRR, No M/G/R. No JVD. No thrill. No extra heart sounds. PULM: CTA B, no wheezes, crackles, rhonchi. No retractions. No resp. distress. No accessory muscle use. ABD: S, NT, ND, +BS. No rebound. No HSM. EXTR: No c/c/e NEURO Normal gait.  PSYCH: Normally interactive. Conversant. Not depressed or anxious appearing.  Calm demeanor.    Assessment and Plan: Essential hypertension  Rheumatoid arthritis, involving unspecified site, unspecified rheumatoid factor presence (HCC)  Dyslipidemia  Tobacco use disorder  Cough - Plan: DG Chest 2 View, CBC  Hospital discharge follow-up - Plan: CBC, Basic metabolic panel  Pre-syncope - Plan: Exercise Tolerance Test  Following up from a recent hospital admission today.  I am not sure if he left AMA or discussed his decision to leave with staff.  In any case, he did not end up having the planned stress test, so will do this for him outpt Also, he notes that he was sick with a cough and felt feverish for several days recently, now  resolved.  Will obtain CXR for him today Reminded him that we will need to get a AAA screening US when he turns 64 as he is a smoker He is currently feeling well but will seek care if any sx return  Signed Abbe Amsterdam, MD Dg Chest 2 View  Result Date:  04/24/2018 CLINICAL DATA:  Cough. EXAM: CHEST - 2 VIEW COMPARISON:  Radiographs of April 10, 2018. FINDINGS: The heart size and mediastinal contours are within normal limits. Both lungs are clear. No pneumothorax or pleural effusion is noted. The visualized skeletal structures are unremarkable. IMPRESSION: No active cardiopulmonary disease. Electronically Signed   By: Lupita Raider, M.D.   On: 04/24/2018 14:22   Results for orders placed or performed in visit on 04/24/18  CBC  Result Value Ref Range   WBC 10.3 4.0 - 10.5 K/uL   RBC 4.37 4.22 - 5.81 Mil/uL   Platelets 319.0 150.0 - 400.0 K/uL   Hemoglobin 12.9 (L) 13.0 - 17.0 g/dL   HCT 62.2 (L) 63.3 - 35.4 %   MCV 87.1 78.0 - 100.0 fl   MCHC 34.0 30.0 - 36.0 g/dL   RDW 56.2 56.3 - 89.3 %  Basic metabolic panel  Result Value Ref Range   Sodium 140 135 - 145 mEq/L   Potassium 5.0 3.5 - 5.1 mEq/L   Chloride 105 96 - 112 mEq/L   CO2 27 19 - 32 mEq/L   Glucose, Bld 106 (H) 70 - 99 mg/dL   BUN 10 6 - 23 mg/dL   Creatinine, Ser 7.34 0.40 - 1.50 mg/dL   Calcium 9.5 8.4 - 28.7 mg/dL   GFR 681.15 >72.62 mL/min   Message to pt with results from today

## 2018-04-24 ENCOUNTER — Encounter (HOSPITAL_BASED_OUTPATIENT_CLINIC_OR_DEPARTMENT_OTHER): Payer: Self-pay

## 2018-04-24 ENCOUNTER — Encounter: Payer: Self-pay | Admitting: Family Medicine

## 2018-04-24 ENCOUNTER — Ambulatory Visit: Payer: BLUE CROSS/BLUE SHIELD | Admitting: Family Medicine

## 2018-04-24 ENCOUNTER — Ambulatory Visit (HOSPITAL_BASED_OUTPATIENT_CLINIC_OR_DEPARTMENT_OTHER)
Admission: RE | Admit: 2018-04-24 | Discharge: 2018-04-24 | Disposition: A | Discharge status: Home / Self Care | Payer: BLUE CROSS/BLUE SHIELD | Source: Ambulatory Visit | Attending: Family Medicine | Admitting: Family Medicine

## 2018-04-24 ENCOUNTER — Other Ambulatory Visit: Payer: Self-pay | Admitting: Family Medicine

## 2018-04-24 ENCOUNTER — Ambulatory Visit (HOSPITAL_BASED_OUTPATIENT_CLINIC_OR_DEPARTMENT_OTHER): Admission: RE | Admit: 2018-04-24 | Payer: BLUE CROSS/BLUE SHIELD | Source: Ambulatory Visit

## 2018-04-24 VITALS — BP 146/80 | HR 87 | Temp 99.1°F | Resp 16 | Ht 67.0 in | Wt 161.0 lb

## 2018-04-24 DIAGNOSIS — R05 Cough: Secondary | ICD-10-CM | POA: Diagnosis not present

## 2018-04-24 DIAGNOSIS — R059 Cough, unspecified: Secondary | ICD-10-CM

## 2018-04-24 DIAGNOSIS — I10 Essential (primary) hypertension: Secondary | ICD-10-CM

## 2018-04-24 DIAGNOSIS — R55 Syncope and collapse: Secondary | ICD-10-CM

## 2018-04-24 DIAGNOSIS — M069 Rheumatoid arthritis, unspecified: Secondary | ICD-10-CM

## 2018-04-24 DIAGNOSIS — Z09 Encounter for follow-up examination after completed treatment for conditions other than malignant neoplasm: Secondary | ICD-10-CM

## 2018-04-24 DIAGNOSIS — E785 Hyperlipidemia, unspecified: Secondary | ICD-10-CM | POA: Diagnosis not present

## 2018-04-24 DIAGNOSIS — F17200 Nicotine dependence, unspecified, uncomplicated: Secondary | ICD-10-CM | POA: Diagnosis not present

## 2018-04-24 LAB — BASIC METABOLIC PANEL
BUN: 10 mg/dL (ref 6–23)
CHLORIDE: 105 meq/L (ref 96–112)
CO2: 27 meq/L (ref 19–32)
Calcium: 9.5 mg/dL (ref 8.4–10.5)
Creatinine, Ser: 0.76 mg/dL (ref 0.40–1.50)
GFR: 113.96 mL/min (ref >60.00)
Glucose, Bld: 106 mg/dL — ABNORMAL HIGH (ref 70–99)
POTASSIUM: 5 meq/L (ref 3.5–5.1)
Sodium: 140 mEq/L (ref 135–145)

## 2018-04-24 LAB — CBC
HCT: 38.1 % — ABNORMAL LOW (ref 39.0–52.0)
Hemoglobin: 12.9 g/dL — ABNORMAL LOW (ref 13.0–17.0)
MCHC: 34 g/dL (ref 30.0–36.0)
MCV: 87.1 fl (ref 78.0–100.0)
PLATELETS: 319 10*3/uL (ref 150.0–400.0)
RBC: 4.37 Mil/uL (ref 4.22–5.81)
RDW: 14 % (ref 11.5–15.5)
WBC: 10.3 10*3/uL (ref 4.0–10.5)

## 2018-04-24 NOTE — Patient Instructions (Signed)
Good to see you- I am glad that you are feeling better!   Please go to the lab and then down to the ground floor for your chest x-ray   I will be in touch with your reports, and we will get you set up for a treadmill test Please let me know if any recurrent symptoms or other concerns in the meantime!

## 2018-04-28 ENCOUNTER — Other Ambulatory Visit: Payer: Self-pay | Admitting: Family Medicine

## 2018-05-01 ENCOUNTER — Other Ambulatory Visit: Payer: Self-pay | Admitting: Family Medicine

## 2018-05-01 ENCOUNTER — Ambulatory Visit (INDEPENDENT_AMBULATORY_CARE_PROVIDER_SITE_OTHER): Payer: BLUE CROSS/BLUE SHIELD

## 2018-05-01 ENCOUNTER — Encounter: Payer: Self-pay | Admitting: Family Medicine

## 2018-05-01 DIAGNOSIS — R55 Syncope and collapse: Secondary | ICD-10-CM

## 2018-05-01 DIAGNOSIS — R6884 Jaw pain: Secondary | ICD-10-CM

## 2018-05-01 DIAGNOSIS — R61 Generalized hyperhidrosis: Secondary | ICD-10-CM | POA: Diagnosis not present

## 2018-05-01 DIAGNOSIS — R9431 Abnormal electrocardiogram [ECG] [EKG]: Secondary | ICD-10-CM

## 2018-05-01 DIAGNOSIS — R42 Dizziness and giddiness: Secondary | ICD-10-CM

## 2018-05-01 DIAGNOSIS — M542 Cervicalgia: Secondary | ICD-10-CM

## 2018-05-01 DIAGNOSIS — R0602 Shortness of breath: Secondary | ICD-10-CM | POA: Diagnosis not present

## 2018-05-01 LAB — EXERCISE TOLERANCE TEST
CHL RATE OF PERCEIVED EXERTION: 17
CSEPED: 12 min
CSEPEW: 13.4 METS
CSEPHR: 102 %
Exercise duration (sec): 0 s
MPHR: 167 {beats}/min
Peak HR: 171 {beats}/min
Rest HR: 93 {beats}/min

## 2018-05-08 ENCOUNTER — Encounter: Payer: Self-pay | Admitting: Family Medicine

## 2018-05-09 ENCOUNTER — Ambulatory Visit: Payer: BLUE CROSS/BLUE SHIELD | Admitting: Family

## 2018-05-09 ENCOUNTER — Inpatient Hospital Stay: Payer: BLUE CROSS/BLUE SHIELD

## 2018-05-09 ENCOUNTER — Encounter: Payer: Self-pay | Admitting: Genetics

## 2018-05-09 ENCOUNTER — Inpatient Hospital Stay: Payer: BLUE CROSS/BLUE SHIELD | Attending: Genetic Counselor | Admitting: Genetics

## 2018-05-09 ENCOUNTER — Encounter: Payer: Self-pay | Admitting: Family

## 2018-05-09 ENCOUNTER — Ambulatory Visit: Payer: BLUE CROSS/BLUE SHIELD | Admitting: Medical

## 2018-05-09 VITALS — BP 142/74 | HR 86 | Temp 98.2°F | Resp 16 | Ht 67.0 in | Wt 163.4 lb

## 2018-05-09 DIAGNOSIS — Z803 Family history of malignant neoplasm of breast: Secondary | ICD-10-CM

## 2018-05-09 DIAGNOSIS — M25422 Effusion, left elbow: Secondary | ICD-10-CM

## 2018-05-09 DIAGNOSIS — Z8481 Family history of carrier of genetic disease: Secondary | ICD-10-CM

## 2018-05-09 DIAGNOSIS — Z801 Family history of malignant neoplasm of trachea, bronchus and lung: Secondary | ICD-10-CM | POA: Insufficient documentation

## 2018-05-09 DIAGNOSIS — M7022 Olecranon bursitis, left elbow: Secondary | ICD-10-CM | POA: Diagnosis not present

## 2018-05-09 DIAGNOSIS — Z800 Family history of malignant neoplasm of digestive organs: Secondary | ICD-10-CM

## 2018-05-09 NOTE — Progress Notes (Signed)
Subjective:    Patient ID: Timothy Mcintosh, male    DOB: [birth year], 52 y.o.   MRN: [id number]  HPI  Patient is a 52 yr old male who presents today with chief complaint of left sided elbow pain. Reports that he injured his elbow 2 weeks ago- banged it while working on his boat. Since that time he has had swelling.  He has been applying cold compresses/heat without improvement. Only has pain if he leans and applies pressure to the left elbow.    Review of Systems Past Medical History:  Diagnosis Date  . Headache   . Hyperlipidemia   . Hypertension   . Rheumatoid arthritis (HCC)   . Vision abnormalities      Social History   Socioeconomic History  . Marital status: Married    Spouse name: Not on file  . Number of children: Not on file  . Years of education: Not on file  . Highest education level: Not on file  Occupational History  . Not on file  Social Needs  . Financial resource strain: Not on file  . Food insecurity:    Worry: Not on file    Inability: Not on file  . Transportation needs:    Medical: Not on file    Non-medical: Not on file  Tobacco Use  . Smoking status: Current Every Day Smoker  . Smokeless tobacco: Never Used  . Tobacco comment: smoking since age 89  Substance and Sexual Activity  . Alcohol use: No    Alcohol/week: 0.0 oz  . Drug use: No  . Sexual activity: Not on file  Lifestyle  . Physical activity:    Days per week: Not on file    Minutes per session: Not on file  . Stress: Not on file  Relationships  . Social connections:    Talks on phone: Not on file    Gets together: Not on file    Attends religious service: Not on file    Active member of club or organization: Not on file    Attends meetings of clubs or organizations: Not on file    Relationship status: Not on file  . Intimate partner violence:    Fear of current or ex partner: Not on file    Emotionally abused: Not on file    Physically abused: Not on file    Forced sexual  activity: Not on file  Other Topics Concern  . Not on file  Social History Narrative  . Not on file    Past Surgical History:  Procedure Laterality Date  . BACK SURGERY Bilateral    c5 -c6  . CARPAL TUNNEL RELEASE Right     Family History  Problem Relation Age of Onset  . High Cholesterol Father   . Kidney Stones Father   . High Cholesterol Brother   . Heart failure Maternal Grandfather        Diet age 19 of "massive heart failure" unknown if MI or not  . Lung cancer Mother     No Known Allergies  Current Outpatient Medications on File Prior to Visit  Medication Sig Dispense Refill  . allopurinol (ZYLOPRIM) 300 MG tablet Take 1 tablet (300 mg total) by mouth daily. 90 tablet 4  . gabapentin (NEURONTIN) 300 MG capsule Take one in the evening and 1 or 2 at bedtime 90 capsule 11  . leflunomide (ARAVA) 20 MG tablet Take 1 tablet (20 mg total) by mouth daily. 30 tablet 0  .  lisinopril (PRINIVIL,ZESTRIL) 20 MG tablet Take 1 po daily for HTN 90 tablet 3  . Multiple Vitamin (MULTIVITAMIN) capsule Take 1 capsule by mouth daily.     Marland Kitchen omeprazole (PRILOSEC) 40 MG capsule Take 1 capsule (40 mg total) by mouth daily. 90 capsule 3  . Probiotic Product (ACIDOPHILUS/GOAT MILK) CAPS Take 1 capsule by mouth daily.     . sildenafil (REVATIO) 20 MG tablet Take 20- 100 mg once daily as needed for ED 50 tablet 5  . simvastatin (ZOCOR) 40 MG tablet Take 1 tablet (40 mg total) by mouth daily. 90 tablet 3  . Tofacitinib Citrate (XELJANZ) 5 MG TABS Take 1 daily- per rheumatology 30 tablet 0  . traZODone (DESYREL) 100 MG tablet Take 1 tablet (100 mg total) by mouth at bedtime as needed for sleep. 90 tablet 3  . UNABLE TO FIND Med Name: CBD oil     No current facility-administered medications on file prior to visit.     BP (!) 142/74 (BP Location: Right Arm, Cuff Size: Normal)   Pulse 86   Temp 98.2 F (36.8 C) (Oral)   Resp 16   Ht 5\' 7"  (1.702 m)   Wt 163 lb 6.4 oz (74.1 kg)   SpO2 99%    BMI 25.59 kg/m       Objective:   Physical Exam  Constitutional: He is oriented to person, place, and time. He appears well-developed and well-nourished. No distress.  HENT:  Head: Normocephalic and atraumatic.  Cardiovascular: Normal rate and regular rhythm.  No murmur heard. Pulmonary/Chest: Effort normal and breath sounds normal. No respiratory distress. He has no wheezes. He has no rales.  Musculoskeletal:  + fluid noted overlying left elbow.  No warmth noted.  No pain with flexion/extension  Neurological: He is alert and oriented to person, place, and time.  Skin: Skin is warm and dry.  Psychiatric: He has a normal mood and affect. His behavior is normal. Thought content normal.          Assessment & Plan:  Olecranon Bursitis- New. Refer to sports medicine for further evaluation.

## 2018-05-09 NOTE — Progress Notes (Addendum)
REFERRING PROVIDER: Darreld Mclean, MD [address] [place], [place] [address]  PRIMARY PROVIDER:  Copland, Gay Filler, MD  PRIMARY REASON FOR VISIT:  1. Family history of genetic disease carrier   2. Family history of breast cancer   3. Family history of lung cancer   4. Family history of throat cancer     HISTORY OF PRESENT ILLNESS:   [name], a 52 y.o. male, was seen for a [facility] cancer genetics consultation at the request of Dr. Lorelei Pont due to a family history of cancer and PALB2 mutations identified in his sisters.  [name] presents to clinic today to discuss the possibility of a hereditary predisposition to cancer, genetic testing, and to further clarify his future cancer risks, as well as potential cancer risks for family members.    [name] is a 52 y.o. male with no personal history of cancer.   Colonoscopy: yes; 2017, normal.  Past Medical History:  Diagnosis Date  . Family history of breast cancer   . Family history of genetic disease carrier    sister PALB2+  . Family history of lung cancer   . Family history of throat cancer   . Headache   . Hyperlipidemia   . Hypertension   . Rheumatoid arthritis ([place])   . Vision abnormalities     Past Surgical History:  Procedure Laterality Date  . BACK SURGERY Bilateral    c5 -c6  . CARPAL TUNNEL RELEASE Right     Social History   Socioeconomic History  . Marital status: Married    Spouse name: Not on file  . Number of children: Not on file  . Years of education: Not on file  . Highest education level: Not on file  Occupational History  . Not on file  Social Needs  . Financial resource strain: Not on file  . Food insecurity:    Worry: Not on file    Inability: Not on file  . Transportation needs:    Medical: Not on file    Non-medical: Not on file  Tobacco Use  . Smoking status: Current Every Day Smoker  . Smokeless tobacco: Never Used  . Tobacco comment: smoking since  age 47  Substance and Sexual Activity  . Alcohol use: No    Alcohol/week: 0.0 oz  . Drug use: No  . Sexual activity: Not on file  Lifestyle  . Physical activity:    Days per week: Not on file    Minutes per session: Not on file  . Stress: Not on file  Relationships  . Social connections:    Talks on phone: Not on file    Gets together: Not on file    Attends religious service: Not on file    Active member of club or organization: Not on file    Attends meetings of clubs or organizations: Not on file    Relationship status: Not on file  Other Topics Concern  . Not on file  Social History Narrative  . Not on file     FAMILY HISTORY:  We obtained a detailed, 4-generation family history.  Significant diagnoses are listed below: Family History  Problem Relation Age of Onset  . High Cholesterol Father   . Kidney Stones Father   . Heart failure Maternal Grandfather        Diet age 29 of "massive heart failure" unknown if MI or not  . Lung cancer Mother   . Breast cancer  Mother 86  . Breast cancer Sister 67       PALB2+  . Breast cancer Paternal Grandfather        7's  . Throat cancer Paternal Grandfather   . Other Sister        PALB2+  . Throat cancer Other   . Throat cancer Other   . Lung cancer Other    [name] has 3 daughters ages 72, 37, and 60 with no history of cancer.  [name] has 6 grandchildren( and 7th on the way).  [name] has 2 sisters: -1 sister is in her late 19's and had genetic testing that revealed a PALB2 pathogenic variant called Ex7del gross.  She elected to have risk-reducing bilateral mastectomy.  -1 sister was recently diagnosed with breast cancer at 14 and is currently undergoing chemotherapy. She has tested positive for the familial PALB2 mutation as well.   Mr. Yolanda Bonine father: died at 64 with no history of cancer.  Paternal Aunts/Uncles: 1 paternal uncle with no known history of cancer. He is in his late 70's.   Paternal  cousins: no history of cancer.  Paternal grandfather: died of breast cancer in his 41's, also hx of throat cancer. He had 2 brothers who had throat cancer.  Paternal grandmother:deceased, no history of cancer.   Mr. Yolanda Bonine mother: died at 38 due to metastatic breast cancer dx at about 85.  She also had a hx of lung cancer.  Patient had letter from his sister's appointment that indicated she also had kidney cancer and skin cancer.  Maternal Aunts/Uncles: 1 paternal uncle is in his 58's with no history of cancer.  Maternal cousins: no history of cancer.  Maternal grandfather: died at 9 due to massive heart attack/heart failure.  Maternal grandmother:deceased, no history of cancer.   Patient's maternal ancestors are of [place] descent, and paternal ancestors are of Northern European descent. There is no reported Ashkenazi Jewish ancestry. There is no known consanguinity.  GENETIC COUNSELING ASSESSMENT: [name] is a 52 y.o. male with a family history of a PALB2 mutation and family history of cancer.  We, therefore, discussed and recommended the following at today's visit.   PALB2 The PALB2 gene provides instructions for making a protein called "Partner And Localizer of BRCA2". As its name suggests, this protein interacts with the protein produced from the BRCA2 gene. These two proteins work together to mend broken strands of DNA, which prevents cells from accumulating genetic damage that can trigger them to divide uncontrollably. Because the PALB2 and BRCA2 proteins help control the rate of cell growth and division, they are described as tumor suppressors.  The PALB2 protein stabilizes the BRCA2 protein which allows the BRCA2 protein to fix damaged DNA. Breaks in DNA can be caused by natural and medical radiation or other environmental exposures.  DNA breaks can also occur when chromosomes exchange genetic material in preparation for cell division. By helping repair mistakes in  DNA, the PALB2 and BRCA2 proteins play a critical role in maintaining the stability of a cell's genetic information.  Individuals with one heterzygous mutation in PALB2 are at increased risk to develop breast, pancreatic, and possibly other types of cancer.   Breast Cancer: Women with a PALB2 mutation are at an increased risk to develop breast cancer.  The risk is about 25-58%.  Women with a family history of breast cancer have risks at the higher end of this range.  Breast Cancer Screening Recommendations for Women: . Breast  self-awareness beginning at age 110 (for women and men) . Clinical breast exams every 6-12 months beginning at age 36 . Annual mammography and breast MRI beginning at age 75, or 10 years earlier than the youngest diagnosis of breast cancer in the family. . Consider risk reducing options including risk reducing/preventative double mastectomy (removing both breasts) or risk reducing medications.  Men with PALB2 mutations also have an increased risk to develop breast cancer, however the risks are not well established are are significantly less than the breast cancer risk for women.  Men with PALB2 mutations should perform monthly self breast exams and report any abnormalities to their doctors promptly.   Pancreatic Cancer: Men and women with a PALB2 mutation are at increased risk to develop pancreatic cancer.  The exact risk is unknown at this time.   If there is a family history of pancreatic cancer in a family with a PALB2 mutation, pancreatic cancer screening may be considered.  Without a family history of pancreatic cancer, there are no recommendations for pancreatic cancer screening at this time.   Other Cancers: There is some evidence to suggest that individuals with a PALB2 mutation also have an increased risk to develop some other types of cancer such as ovarian, prostate, and possibly other types of cancer.  There is currently not enough information to make any  specific recommendations about screening or risk reducing surgery for these cancer types.   Fanconi Anemia: Individuals who have 2 pathogenic mutations in both of their PALB2 genes have a condition called Fanconi Anemia.   This is a condition that often begins in childhood and causes bone abnormalities, bone marrow failure, anemia, increased risk for blood cancers, and several other abnormalities.  If 2 individuals who are both carriers (have 1 PALB2 mutation) have children together, then there is a risk for their children to be affected with Fanconi Anemia.    DISCUSSION: We reviewed the characteristics, features and inheritance patterns of hereditary cancer syndromes. We also discussed genetic testing, including the appropriate family members to test, the process of testing, insurance coverage and turn-around-time for results. We discussed the implications of a negative, positive and/or variant of uncertain significant result. We discussed the option of single site genetic testing for the familial PALB2 mutation vs. Panel testing.  [name] elected to pursue genetic testing for the Common Hereditary Cancers Panel gene panel.   The Common Hereditary Cancer Panel offered by [facility] includes sequencing and/or deletion duplication testing of the following 47 genes: APC, ATM, AXIN2, BARD1, BMPR1A, BRCA1, BRCA2, BRIP1, CDH1, CDKN2A (p14ARF), CDKN2A (p16INK4a), CKD4, CHEK2, CTNNA1, DICER1, EPCAM (Deletion/duplication testing only), GREM1 (promoter region deletion/duplication testing only), KIT, MEN1, MLH1, MSH2, MSH3, MSH6, MUTYH, NBN, NF1, NHTL1, PALB2, PDGFRA, PMS2, POLD1, POLE, PTEN, RAD50, RAD51C, RAD51D, SDHB, SDHC, SDHD, SMAD4, SMARCA4. STK11, TP53, TSC1, TSC2, and VHL.  The following genes were evaluated for sequence changes only: SDHA and HOXB13 c.251G>A variant only.  We discussed that if he is found to have a mutation in one of these genes, it may impact future medical management recommendations  such as increased cancer screenings and consideration of risk reducing surgeries.  A positive result could also have implications for the patient's family members.  A Negative result would mean he did not inherit the familial PALB2 mutation and no other hereditary cancer risks were identified.  However, genetic testing is not perfect and does not rule out the possibility of a hereditary predisposition to cancer.  There could be mutations that are  undetectable by current technology, or in genes not yet tested or identified to increase cancer risk.    We discussed the potential to find a Variant of Uncertain Significance or VUS.  These are variants that have not yet been identified as pathogenic or benign, and it is unknown if this variant is associated with increased cancer risk or if this is a normal finding.  Most VUS's are reclassified to benign or likely benign.   It should not be used to make medical management decisions. With time, we suspect the lab will determine the significance of any VUS's identified if any.   Based on [name]'s family history of cancer and PALB2 mutation, he meets medical criteria for genetic testing. Despite that he meets criteria, he may still have an out of pocket cost. Insurance may or may not cover panel testing since there is a known family variant.  [name] said that if it is unlikely insurance will cover panel testing, he would still like to proceed with the panel and will self pay the $250.   We discussed that some people do not want to undergo genetic testing due to fear of genetic discrimination.  A federal law called the Genetic Information Non-Discrimination Act (GINA) of 2008 helps protect individuals against genetic discrimination based on their genetic test results.  It impacts both health insurance and employment.  For health insurance, it protects against increased premiums, being kicked off insurance or being forced to take a test in order to be  insured.  For employment it protects against hiring, firing and promoting decisions based on genetic test results.  Health status due to a cancer diagnosis is not protected under GINA.  This law does not protect life insurance, disability insurance, or other types of insurance.   PLAN: After considering the risks, benefits, and limitations, [name]  provided informed consent to pursue genetic testing and the blood sample was sent to [facility] for analysis of the Common Hereditary Cancers Panel. Results should be available within approximately 2-3 weeks' time, at which point they will be disclosed by telephone to [name], as will any additional recommendations warranted by these results. [name] will receive a summary of his genetic counseling visit and a copy of his results once available. This information will also be available in Epic. We encouraged [name] to remain in contact with cancer genetics annually so that we can continuously update the family history and inform him of any changes in cancer genetics and testing that may be of benefit for his family. Mr. Yolanda Bonine questions were answered to his satisfaction today. Our contact information was provided should additional questions or concerns arise.  Based on [name]'s family history, we recommended his nieces/nephews and maternal and paternal relatives also have genetic testing.  We do not know at this time if the mutation was maternally or paternally inherited.  [name] will let us know if we can be of any assistance in coordinating genetic counseling and/or testing for this family member.   Lastly, we encouraged [name] to remain in contact with cancer genetics annually so that we can continuously update the family history and inform him of any changes in cancer genetics and testing that may be of benefit for this family.   Mr.  Yolanda Bonine questions were answered to his satisfaction today. Our contact  information was provided should additional questions or concerns arise. Thank you for the referral and allowing [place] to share in the care of  your patient.   Tana Felts, MS, [facility] Certified Genetic Counselor [email or web address] phone: [phone]  The patient was seen for a total of 30 minutes in face-to-face genetic counseling.  This patient was discussed with Drs. Magrinat, Lindi Adie and/or Burr Medico who agrees with the above.

## 2018-05-09 NOTE — Patient Instructions (Signed)
You should be contacted about your referral to sports medicine.  

## 2018-05-16 ENCOUNTER — Encounter: Payer: Self-pay | Admitting: Family Medicine

## 2018-05-16 ENCOUNTER — Ambulatory Visit: Payer: BLUE CROSS/BLUE SHIELD | Admitting: Family Medicine

## 2018-05-16 DIAGNOSIS — M25522 Pain in left elbow: Secondary | ICD-10-CM | POA: Insufficient documentation

## 2018-05-16 DIAGNOSIS — I10 Essential (primary) hypertension: Secondary | ICD-10-CM | POA: Insufficient documentation

## 2018-05-16 HISTORY — DX: Pain in left elbow: M25.522

## 2018-05-16 MED ORDER — METHYLPREDNISOLONE ACETATE 40 MG/ML IJ SUSP
40.0000 mg | Freq: Once | INTRAMUSCULAR | Status: AC
Start: 2018-05-16 — End: 2018-05-16
  Administered 2018-05-16: 40 mg via INTRA_ARTICULAR

## 2018-05-16 NOTE — Assessment & Plan Note (Signed)
Left elbow olecranon bursitis - reassured patient.  He would like to go ahead with aspiration and injection which was performed today.  Icing, aleve, compression.  Consider elbow pad.  F/u in 1 month.  After informed written consent timeout was performed.  Patient was seated in chair in exam room.  Alcohol swab used to prep area over the left olecranon bursa, 60mL bupivicaine used for local anesthesia.  Then using an 18g needle on a 20cc syringe, 6 mL of serosanguinous fluid was aspirated from patient's left olecranon bursa followed by injection of 27mL 40mg  bupivicaine.  Patient tolerated procedure well without immediate complications.

## 2018-05-16 NOTE — Progress Notes (Signed)
PCP: Copland, Gay Filler, MD Consultation requested by: Debbrah Alar NP  Subjective:   HPI: Patient is a 52 y.o. male here for left elbow pain.  Patient reports about 6 weeks ago he accidentally struck posterior left elbow on a boat. Initially had pain here and noticed swelling a couple weeks afterwards. Now pain is down to 0/10 level. Has been icing and using heat, wrapping this. Not taking any medicines for this. Still with swelling but no skin changes, no fever.  Past Medical History:  Diagnosis Date  . Family history of breast cancer   . Family history of genetic disease carrier    sister PALB2+  . Family history of lung cancer   . Family history of throat cancer   . Headache   . Hyperlipidemia   . Hypertension   . Rheumatoid arthritis ([place])   . Vision abnormalities     Current Outpatient Medications on File Prior to Visit  Medication Sig Dispense Refill  . allopurinol (ZYLOPRIM) 300 MG tablet Take 1 tablet (300 mg total) by mouth daily. 90 tablet 4  . gabapentin (NEURONTIN) 300 MG capsule Take one in the evening and 1 or 2 at bedtime 90 capsule 11  . leflunomide (ARAVA) 20 MG tablet Take 1 tablet (20 mg total) by mouth daily. 30 tablet 0  . lisinopril (PRINIVIL,ZESTRIL) 20 MG tablet Take 1 po daily for HTN 90 tablet 3  . Multiple Vitamin (MULTIVITAMIN) capsule Take 1 capsule by mouth daily.     Marland Kitchen omeprazole (PRILOSEC) 40 MG capsule Take 1 capsule (40 mg total) by mouth daily. 90 capsule 3  . Probiotic Product (ACIDOPHILUS/GOAT MILK) CAPS Take 1 capsule by mouth daily.     . sildenafil (REVATIO) 20 MG tablet Take 20- 100 mg once daily as needed for ED 50 tablet 5  . simvastatin (ZOCOR) 40 MG tablet Take 1 tablet (40 mg total) by mouth daily. 90 tablet 3  . SUBOXONE 8-2 MG FILM PLACE 2 FILMS SUBLINGUALLY EVERY DAY FOR TREATMENT OF PAIN  0  . Tofacitinib Citrate (XELJANZ) 5 MG TABS Take 1 daily- per rheumatology 30 tablet 0  . traZODone (DESYREL) 100 MG tablet Take 1  tablet (100 mg total) by mouth at bedtime as needed for sleep. 90 tablet 3  . UNABLE TO FIND Med Name: CBD oil     No current facility-administered medications on file prior to visit.     Past Surgical History:  Procedure Laterality Date  . BACK SURGERY Bilateral    c5 -c6  . CARPAL TUNNEL RELEASE Right     No Known Allergies  Social History   Socioeconomic History  . Marital status: Married    Spouse name: Not on file  . Number of children: Not on file  . Years of education: Not on file  . Highest education level: Not on file  Occupational History  . Not on file  Social Needs  . Financial resource strain: Not on file  . Food insecurity:    Worry: Not on file    Inability: Not on file  . Transportation needs:    Medical: Not on file    Non-medical: Not on file  Tobacco Use  . Smoking status: Current Every Day Smoker  . Smokeless tobacco: Never Used  . Tobacco comment: smoking since age 69  Substance and Sexual Activity  . Alcohol use: No    Alcohol/week: 0.0 oz  . Drug use: No  . Sexual activity: Not on file  Lifestyle  .  Physical activity:    Days per week: Not on file    Minutes per session: Not on file  . Stress: Not on file  Relationships  . Social connections:    Talks on phone: Not on file    Gets together: Not on file    Attends religious service: Not on file    Active member of club or organization: Not on file    Attends meetings of clubs or organizations: Not on file    Relationship status: Not on file  . Intimate partner violence:    Fear of current or ex partner: Not on file    Emotionally abused: Not on file    Physically abused: Not on file    Forced sexual activity: Not on file  Other Topics Concern  . Not on file  Social History Narrative  . Not on file    Family History  Problem Relation Age of Onset  . High Cholesterol Father   . Kidney Stones Father   . Heart failure Maternal Grandfather        Diet age 18 of "massive heart  failure" unknown if MI or not  . Lung cancer Mother   . Breast cancer Mother 28  . Kidney cancer Mother   . Skin cancer Mother   . Breast cancer Sister 77       PALB2+  . Breast cancer Paternal Grandfather        47's  . Throat cancer Paternal Grandfather   . Other Sister        PALB2+  . Throat cancer Other   . Throat cancer Other     BP 128/78   Pulse 79   Ht 5' 7"  (1.702 m)   Wt 165 lb (74.8 kg)   BMI 25.84 kg/m   Review of Systems: See HPI above.     Objective:  Physical Exam:  Gen: NAD, comfortable in exam room  Left elbow: Mild swelling of olecranon bursa.  No bruising, other deformity.  No redness, warmth. FROM with 5/5 strength. No tenderness to palpation. Collateral ligaments intact. NVI distally.  Right elbow: No deformity. FROM with 5/5 strength. No tenderness to palpation. NVI distally.   Assessment & Plan:  1. Left elbow olecranon bursitis - reassured patient.  He would like to go ahead with aspiration and injection which was performed today.  Icing, aleve, compression.  Consider elbow pad.  F/u in 1 month.  After informed written consent timeout was performed.  Patient was seated in chair in exam room.  Alcohol swab used to prep area over the left olecranon bursa, 22m bupivicaine used for local anesthesia.  Then using an 18g needle on a 20cc syringe, 6 mL of serosanguinous fluid was aspirated from patient's left olecranon bursa followed by injection of [id number].  Patient tolerated procedure well without immediate complications.

## 2018-05-16 NOTE — Patient Instructions (Signed)
You have olecranon bursitis. Ice the area 3-4 times a day for 15 minutes at a time Take aleve 2 tabs twice a day with food for pain, swelling, and inflammation. Compression typically for 1-2 weeks to help keep swelling down. Consider aspiration and injection of the area as well - we went ahead with this today. Consider elbow pad for protection to prevent irritation and additional swelling. Follow up with me in 1 month or as needed.

## 2018-05-26 ENCOUNTER — Telehealth: Payer: Self-pay | Admitting: Genetics

## 2018-05-26 NOTE — Telephone Encounter (Signed)
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    We discussed we would recommend monthly self breast exams and yearly clinical breast/chest wall exams for him. Pancreatic cancer screening is not typically recommended unless there is family history of pancreatic cancer.  There is not currently no family history of pancreatic cancer, he does smoke cigarettes.  There are no guidelines regarding pancreatic cancer risk, but MRCP and EUS can be considered.    We discussed testing for her daughters and other relatives.  Mr. Rachel Moulds gives permission to discuss his genetic test result and all information obtained during his genetic counseling session with his daughters: Aundra Dubin (or [email or web address]) just got married and may not have changed her name officially yet, Holly Benton, and [employer].  He will give them my contact information and they can reach out to me schedule an appointment for testing.

## 2018-05-31 ENCOUNTER — Encounter: Payer: Self-pay | Admitting: Genetics

## 2018-05-31 ENCOUNTER — Ambulatory Visit: Payer: Self-pay | Admitting: Genetics

## 2018-05-31 DIAGNOSIS — Z1501 Genetic susceptibility to malignant neoplasm of breast: Secondary | ICD-10-CM

## 2018-05-31 DIAGNOSIS — Z1379 Encounter for other screening for genetic and chromosomal anomalies: Secondary | ICD-10-CM

## 2018-05-31 DIAGNOSIS — Z1589 Genetic susceptibility to other disease: Principal | ICD-10-CM | POA: Insufficient documentation

## 2018-05-31 DIAGNOSIS — Z1509 Genetic susceptibility to other malignant neoplasm: Secondary | ICD-10-CM

## 2018-05-31 HISTORY — DX: Encounter for other screening for genetic and chromosomal anomalies: Z13.79

## 2018-05-31 HISTORY — DX: Genetic susceptibility to malignant neoplasm of breast: Z15.01

## 2018-05-31 HISTORY — DX: Genetic susceptibility to other malignant neoplasm: Z15.09

## 2018-05-31 NOTE — Progress Notes (Addendum)
HPI: [name] was previously seen in the [facility] on 05/09/2018 due to a family history of a PALB2 likely pathogenic mutation.  Please refer to our prior cancer genetics clinic note for more information regarding [name]'s medical, social and family histories, and our assessment and recommendations, at the time. [name] recent genetic test results were disclosed to him, as well as recommendations warranted by these results. These results and recommendations are discussed in more detail below.  CANCER HISTORY:   [name] is a 52 y.o. male with no personal history of cancer.     FAMILY HISTORY:  We obtained a detailed, 4-generation family history.  Significant diagnoses are listed below: Family History  Problem Relation Age of Onset  . High Cholesterol Father   . Kidney Stones Father   . Heart failure Maternal Grandfather        Diet age 9 of "massive heart failure" unknown if MI or not  . Lung cancer Mother   . Breast cancer Mother 15  . Kidney cancer Mother   . Skin cancer Mother   . Breast cancer Sister 55       PALB2+  . Breast cancer Paternal Grandfather        53's  . Throat cancer Paternal Grandfather   . Other Sister        PALB2+  . Throat cancer Other   . Throat cancer Other     [name] has 3 daughters ages 6, 25, and 74 with no history of cancer.  [name] has 6 grandchildren( and 7th on the way).  [name] has 2 sisters: -1 sister is in her late 45's and had genetic testing that revealed a PALB2 pathogenic variant called Ex7del gross.  She elected to have risk-reducing bilateral mastectomy.  -1 sister was recently diagnosed with breast cancer at 71 and is currently undergoing chemotherapy. She has tested positive for the familial PALB2 mutation as well.   [name] father: died at 58 with no history of cancer.  Paternal Aunts/Uncles: 1 paternal uncle with no known history of cancer. He is in his late 30's.     Paternal cousins: no history of cancer.  Paternal grandfather: died of breast cancer in his 44's, also hx of throat cancer. He had 2 brothers who had throat cancer.  Paternal grandmother:deceased, no history of cancer.   [name] mother: died at 19 due to metastatic breast cancer dx at about 5.  She also had a hx of lung cancer.  Patient had letter from his sister's appointment that indicated she also had kidney cancer and skin cancer.  Maternal Aunts/Uncles: 1 paternal uncle is in his 53's with no history of cancer.  Maternal cousins: no history of cancer.  Maternal grandfather: died at 40 due to massive heart attack/heart failure.  Maternal grandmother:deceased, no history of cancer.   Patient's maternal ancestors are of [place] descent, and paternal ancestors are of [place] European descent. There is no reported Ashkenazi Jewish ancestry. There is no known consanguinity.  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  This is the same variant previously identified in his sister.   The test report will be scanned into EPIC and will be located under the Molecular Pathology section of the Results Review tab.A portion of the result report is  included below for reference.     We discussed with [name] that because current genetic testing is not perfect, it is possible there may be a gene mutation in one of these genes that current testing cannot detect, but that chance is small. We also discussed, that there could be another gene that has not yet been discovered, or that we have not yet tested, that is responsible for the cancer diagnoses in the family. Therefore, it is important to remain in touch with cancer genetics in the future so that we can continue to offer [name] the most up to  date genetic testing.    PALB2 The PALB2 gene provides instructions for making a protein called "Partner And Localizer of BRCA2". As its name suggests, this protein interacts with the protein produced from the BRCA2 gene. These two proteins work together to mend broken strands of DNA, which prevents cells from accumulating genetic damage that can trigger them to divide uncontrollably. Because the PALB2 and BRCA2 proteins help control the rate of cell growth and division, they are described as tumor suppressors.  The PALB2 protein stabilizes the BRCA2 protein which allows the BRCA2 protein to fix damaged DNA. Breaks in DNA can be caused by natural and medical radiation or other environmental exposures.  DNA breaks can also occur when chromosomes exchange genetic material in preparation for cell division. By helping repair mistakes in DNA, the PALB2 and BRCA2 proteins play a critical role in maintaining the stability of a cell's genetic information.  Individuals with one heterzygous mutation in PALB2 are at increased risk to develop breast, pancreatic, and possibly other types of cancer.   Breast Cancer: Women with a PALB2 mutation are at an increased risk to develop breast cancer.  The risk is about 25-58%.  Women with a family history of breast cancer have risks at the higher end of this range.  Men with PALB2 mutations also have an increased risk to develop breast cancer, however the risks are not well established are are significantly less than the breast cancer risk for women.  We recommend monthly self breast exams and routine clinical breast exams.  Any lumps/abnormalities should be promptly evaluated.    Pancreatic Cancer: Men and women with a PALB2 mutation are at increased risk to develop pancreatic cancer.  The exact risk is unknown at this time.   If there is a family history of pancreatic cancer in a family with a PALB2 mutation, pancreatic cancer screening may be considered.  Without  a family history of pancreatic cancer, there are no recommendations for pancreatic cancer screening at this time.   Lifestyle factors such as smoking and alcohol abuse can also contribute to pancreatic cancer risk and it is recommended to avoid these exposures.   Other Cancers: There is some evidence to suggest that individuals with a PALB2 mutation also have an increased risk to develop some other types of cancer such as ovarian, prostate, and possibly other types of cancer.  There is currently not enough information to make any specific recommendations about screening or risk reducing surgery for these cancer types.   Fanconi Anemia: Individuals who have 2 pathogenic mutations in both of their PALB2 genes have a condition called Fanconi Anemia.  This is a condition that often begins in childhood and causes bone abnormalities, bone marrow failure, anemia, increased risk for blood cancers, and several other abnormalities.  [name] only has 1 mutation in [place] and does not have this condition, however he is  a carrier for this disease.  If 2 individuals who are both carriers (have 1 PALB2 mutation) have children together, then there is a risk for their children to be affected with Fanconi Anemia.    FAMILY MEMBERS: It is important that all of [name]'s relatives (both men and women) know of the presence of this gene mutation. Site-specific genetic testing can sort out who in the family is at risk and who is not.   [name] children and siblings have a 50% chance to have inherited this mutation. We recommend they have genetic testing for this same mutation, as identifying the presence of this mutation would allow them to also take advantage of risk-reducing measures.  Other relatives such as aunts/uncles/cousins, etc are also at risk to have inherited this familial mutation.  At this time it has not been determined if this was maternally or paternally inherited therefore testing is  recommended for relatives on both sides of the family.   PLAN:   1. These results will be sent back to [name];'s referring provider Jessica Copland for follow-up  2. [name] plans to tell his children and other relatives about this mutation.  He gives permission for [place] to discuss his genetic test results and any information obtained during his genetic counseling session with his daughters: Minerva Ends, [place], and [employer].   SUPPORT AND RESOURCES: If [name] is interested in more information and support for individuals with a PALB2 mutation, there are two groups, Facing Our Risk (www.facingourrisk.com) and Bright Pink (www.brightpink.org) which some people have found useful. They provide opportunities to speak with other individuals from high-risk families. To locate genetic counselors in other cities, visit the website of the [employer] (ArtistMovie.se) and Secretary/administrator for a Social worker by zip code.   We encouraged [name] to remain in contact with [place] on an annual basis so we can update his personal and family histories, and let him know of advances in cancer genetics that may benefit the family. Our contact number was provided. [name] questions were answered to his satisfaction today, and he knows he is welcome to call anytime with additional questions.    Ferol Luz, MS, [facility] Certified Genetic Counselor [email or web address]

## 2018-07-08 ENCOUNTER — Other Ambulatory Visit: Payer: Self-pay | Admitting: Family Medicine

## 2018-07-08 DIAGNOSIS — I10 Essential (primary) hypertension: Secondary | ICD-10-CM

## 2018-09-03 NOTE — Progress Notes (Signed)
[facility] [facility] [address], [place], [place] [address] [phone]  Date:  09/11/2018   Name:  Timothy Mcintosh   DOB:  11/26/1965   MRN:  [id number]  PCP:  Darreld Mclean, MD    Chief Complaint: Annual Exam (trying to quit smoking) and Flu Vaccine   History of Present Illness:  Timothy Mcintosh is a 52 y.o. very pleasant male patient who presents with the following:  Following up today History of RA, depression, HTN, hyperlipidemia, GERD, gout, family history of cancers  Wishes to have a flu shot and discus quitting smoking He recently underwent medical genetic testing due to high incidence of cancer in his family GENETIC TEST RESULTS: Genetic testing performed through [facility] Common Hereditary Cancers panel + Renal cancer panel reported out on 05/19/2018 was POSITIVE for a Likely pathogenic variant in the gene PALB2 Deletion (Exon 7).  This is the same variant previously identified in his sister.  He was told that as a male there is no action that he needs to take except he is supposed to keep an eye on his chest to watch for any masses .  There is a history of male breast cancer in his family He has informed his 3 daughters about their risk and 2/ 3 tested negative.  One did not get tested as of yet but plans to do so   We did a PSA in May  Flu: do today Shingrix: he wishes to hold off on this   He had a head injury about a year ago and saw neurology. He feels like he is back to normal Over the summer he had a strange episode-? Cardiac- and underwent stress testing that looked ok  No more incidents have occurred Exercise- he is very active in his daily life.  He does have some OA pain and has noted some right heel pain He has tried ice It is worst in the am when he gets out of bed. Better with walking- he is not sure however if this is PF as it seems to be father back in his foot  No CP or SOB RA: Dr. Amil Amen He  is on arava, zeljanz They have considered stopping these meds as they don't seem to be helping him all that much He did use methotrexate in the past but he is no longer on this His knees and his hips are most bothersome to him  Allopurinol- no recent gout Gabapentin at bedtime Arava Lisinopril- he is taking 20 mg. He does check his BP at home prilosec revatio Simvastatin suboxone xeljanz Trazodone at bedtime   He is otherwise feeling well Mood is good His family is doing well- he just had his 7th grand, a boy. He is thrilled  Smoker- he would like to quit!  He has tried nicotine gum, trying to quit on his own He has used wellbutrin in the past for this and it did seem to help.  However it gave him some issues with sleep in the past  He did try chantix but it made it harder for him to sleep- worse than wellbutrin  We think that he has welllbutrin 150 sr at home and he will go back on this BID- take once a day for first 3 days  BP Readings from Last 3 Encounters:  09/11/18 134/68  05/16/18 128/78  05/09/18 (!) 142/74     Patient Active Problem List   Diagnosis  Date Noted  . Monoallelic mutation of PALB2 gene 05/31/2018  . Genetic testing 05/31/2018  . Hypertension 05/16/2018  . Left elbow pain 05/16/2018  . Family history of genetic disease carrier   . Family history of breast cancer   . Family history of lung cancer   . Family history of throat cancer   . Post concussion syndrome 09/15/2017  . Restless leg syndrome 09/15/2017  . Rheumatoid arthritis ([place]) 04/01/2016  . Dyslipidemia 12/25/2015  . Tobacco use disorder 12/25/2015  . Insomnia 12/25/2015  . Primary osteoarthritis of left knee 07/30/2015  . GERD (gastroesophageal reflux disease) 03/04/2014  . Major depression 03/04/2014  . Asthmatic bronchitis 09/19/2013  . ED (erectile dysfunction) 04/19/2013  . Dorsalgia 11/21/2011  . Hyperlipidemia 11/21/2011  . Gout 11/21/2011  . GAD (generalized anxiety  disorder) 11/21/2011    Past Medical History:  Diagnosis Date  . Family history of breast cancer   . Family history of genetic disease carrier    sister PALB2+  . Family history of lung cancer   . Family history of throat cancer   . Headache   . Hyperlipidemia   . Hypertension   . Rheumatoid arthritis ([place])   . Vision abnormalities     Past Surgical History:  Procedure Laterality Date  . BACK SURGERY Bilateral    c5 -c6  . CARPAL TUNNEL RELEASE Right     Social History   Tobacco Use  . Smoking status: Current Every Day Smoker  . Smokeless tobacco: Never Used  . Tobacco comment: smoking since age 89  Substance Use Topics  . Alcohol use: No    Alcohol/week: 0.0 standard drinks  . Drug use: No    Family History  Problem Relation Age of Onset  . High Cholesterol Father   . Kidney Stones Father   . Heart failure Maternal Grandfather        Diet age 66 of "massive heart failure" unknown if MI or not  . Lung cancer Mother   . Breast cancer Mother 62  . Kidney cancer Mother   . Skin cancer Mother   . Breast cancer Sister 10       PALB2+  . Breast cancer Paternal Grandfather        14's  . Throat cancer Paternal Grandfather   . Other Sister        PALB2+  . Throat cancer Other   . Throat cancer Other     No Known Allergies  Medication list has been reviewed and updated.  Current Outpatient Medications on File Prior to Visit  Medication Sig Dispense Refill  . allopurinol (ZYLOPRIM) 300 MG tablet Take 1 tablet (300 mg total) by mouth daily. 90 tablet 4  . gabapentin (NEURONTIN) 300 MG capsule Take one in the evening and 1 or 2 at bedtime 90 capsule 11  . leflunomide (ARAVA) 20 MG tablet Take 1 tablet (20 mg total) by mouth daily. 30 tablet 0  . lisinopril (PRINIVIL,ZESTRIL) 20 MG tablet TAKE 1 AND 1/2 TABLETS BY MOUTH DAILY FOR BLOOD PRESSURE, MAY INCREASE TO 2 TABLETS DAILY IF NEEDED 180 tablet 1  . Multiple Vitamin (MULTIVITAMIN) capsule Take 1 capsule by  mouth daily.     Marland Kitchen omeprazole (PRILOSEC) 40 MG capsule Take 1 capsule (40 mg total) by mouth daily. 90 capsule 3  . Probiotic Product (ACIDOPHILUS/GOAT MILK) CAPS Take 1 capsule by mouth daily.     . sildenafil (REVATIO) 20 MG tablet Take 20- 100 mg once daily as  needed for ED 50 tablet 5  . simvastatin (ZOCOR) 40 MG tablet Take 1 tablet (40 mg total) by mouth daily. 90 tablet 3  . SUBOXONE 8-2 MG FILM PLACE 2 FILMS SUBLINGUALLY EVERY DAY FOR TREATMENT OF PAIN  0  . Tofacitinib Citrate (XELJANZ) 5 MG TABS Take 1 daily- per rheumatology 30 tablet 0  . traZODone (DESYREL) 100 MG tablet Take 1 tablet (100 mg total) by mouth at bedtime as needed for sleep. 90 tablet 3  . UNABLE TO FIND Med Name: CBD oil     No current facility-administered medications on file prior to visit.     Review of Systems: No fever or chills No CP or SOB  As per HPI- otherwise negative.   Physical Examination: Vitals:   09/11/18 0845  BP: 134/68  Pulse: 84  Resp: 16  Temp: 98.6 F (37 C)  SpO2: 96%   Vitals:   09/11/18 0845  Weight: 163 lb (73.9 kg)  Height: 5' 7"  (1.702 m)   Body mass index is 25.53 kg/m. Ideal Body Weight: Weight in (lb) to have BMI = 25: 159.3  GEN: WDWN, NAD, Non-toxic, A & O x 3, normal weight, looks well, smoker  HEENT: Atraumatic, Normocephalic. Neck supple. No masses, No LAD. Ears and Nose: No external deformity. CV: RRR, No M/G/R. No JVD. No thrill. No extra heart sounds. PULM: CTA B, no wheezes, crackles, rhonchi. No retractions. No resp. distress. No accessory muscle use. ABD: S, NT, ND, +BS. No rebound. No HSM. EXTR: No c/c/e NEURO Normal gait.  PSYCH: Normally interactive. Conversant. Not depressed or anxious appearing.  Calm demeanor.  He has mild tenderness over the medial aspect of the left calcaneous Achilles is intact Not tender over PF insertion  Assessment and Plan: Medication monitoring encounter - Plan: Basic metabolic panel  Need for influenza  vaccination - Plan: Flu Vaccine QUAD 6+ mos PF IM (Fluarix Quad PF)  Screening for deficiency anemia - Plan: CBC  Family history of genetic disease carrier  Tobacco use disorder  Essential hypertension  Pain of right heel  Following up today Flu shot given Labs pending as above He will reduce walking for a couple of week and see if his heel sx resolve- if not he will alert me  He plans to use wellbutrin that he has at home for smoking cessation Will plan further follow- up pending labs. Recheck in 6 months   Signed Lamar Blinks, MD  Received his labs Results for orders placed or performed in visit on 41/28/78  Basic metabolic panel  Result Value Ref Range   Sodium 138 135 - 145 mEq/L   Potassium 5.3 (H) 3.5 - 5.1 mEq/L   Chloride 104 96 - 112 mEq/L   CO2 29 19 - 32 mEq/L   Glucose, Bld 108 (H) 70 - 99 mg/dL   BUN 14 6 - 23 mg/dL   Creatinine, Ser 0.90 0.40 - 1.50 mg/dL   Calcium 9.5 8.4 - 10.5 mg/dL   GFR 93.62 >60.00 mL/min  CBC  Result Value Ref Range   WBC 8.6 4.0 - 10.5 K/uL   RBC 4.61 4.22 - 5.81 Mil/uL   Platelets 284.0 150.0 - 400.0 K/uL   Hemoglobin 13.5 13.0 - 17.0 g/dL   HCT 40.7 39.0 - 52.0 %   MCV 88.3 78.0 - 100.0 fl   MCHC 33.2 30.0 - 36.0 g/dL   RDW 13.6 11.5 - 15.5 %

## 2018-09-11 ENCOUNTER — Other Ambulatory Visit: Payer: Self-pay | Admitting: Neurology

## 2018-09-11 ENCOUNTER — Encounter: Payer: Self-pay | Admitting: Family Medicine

## 2018-09-11 ENCOUNTER — Ambulatory Visit: Payer: BLUE CROSS/BLUE SHIELD | Admitting: Family Medicine

## 2018-09-11 VITALS — BP 134/68 | HR 84 | Temp 98.6°F | Resp 16 | Ht 67.0 in | Wt 163.0 lb

## 2018-09-11 DIAGNOSIS — M79671 Pain in right foot: Secondary | ICD-10-CM

## 2018-09-11 DIAGNOSIS — F17200 Nicotine dependence, unspecified, uncomplicated: Secondary | ICD-10-CM | POA: Diagnosis not present

## 2018-09-11 DIAGNOSIS — Z23 Encounter for immunization: Secondary | ICD-10-CM

## 2018-09-11 DIAGNOSIS — Z5181 Encounter for therapeutic drug level monitoring: Secondary | ICD-10-CM | POA: Diagnosis not present

## 2018-09-11 DIAGNOSIS — Z130 Encounter for screening for diseases of the blood and blood-forming organs and certain disorders involving the immune mechanism: Secondary | ICD-10-CM | POA: Diagnosis not present

## 2018-09-11 DIAGNOSIS — Z8481 Family history of carrier of genetic disease: Secondary | ICD-10-CM | POA: Diagnosis not present

## 2018-09-11 DIAGNOSIS — E875 Hyperkalemia: Secondary | ICD-10-CM

## 2018-09-11 DIAGNOSIS — I10 Essential (primary) hypertension: Secondary | ICD-10-CM

## 2018-09-11 LAB — BASIC METABOLIC PANEL
BUN: 14 mg/dL (ref 6–23)
CHLORIDE: 104 meq/L (ref 96–112)
CO2: 29 mEq/L (ref 19–32)
Calcium: 9.5 mg/dL (ref 8.4–10.5)
Creatinine, Ser: 0.9 mg/dL (ref 0.40–1.50)
GFR: 93.62 mL/min (ref >60.00)
GLUCOSE: 108 mg/dL — AB (ref 70–99)
POTASSIUM: 5.3 meq/L — AB (ref 3.5–5.1)
SODIUM: 138 meq/L (ref 135–145)

## 2018-09-11 LAB — CBC
HCT: 40.7 % (ref 39.0–52.0)
HEMOGLOBIN: 13.5 g/dL (ref 13.0–17.0)
MCHC: 33.2 g/dL (ref 30.0–36.0)
MCV: 88.3 fl (ref 78.0–100.0)
PLATELETS: 284 10*3/uL (ref 150.0–400.0)
RBC: 4.61 Mil/uL (ref 4.22–5.81)
RDW: 13.6 % (ref 11.5–15.5)
WBC: 8.6 10*3/uL (ref 4.0–10.5)

## 2018-09-11 NOTE — Patient Instructions (Addendum)
Good to see you as always!  I will be in touch with your labs Best of luck with smoking cessation Go back on wellbutrin sr 150 take this once a day for 3 days, then go to twice a day  Please try to limit your walking for a week or two- if the heel pain does not resolve please alert me   Please see me in about 6 months for your labs and a recheck visit  Flu shot given today We will plan to start lung cancer screening CT for you at age 52: The USPSTF recommends annual screening for lung cancer with low-dose computed tomography (LDCT) in adults aged 87 to 62 years who have a 30 pack-year smoking history and currently smoke or have quit within the past 15 years

## 2018-10-04 ENCOUNTER — Encounter: Payer: Self-pay | Admitting: Family Medicine

## 2018-10-04 DIAGNOSIS — G8929 Other chronic pain: Secondary | ICD-10-CM

## 2018-10-04 DIAGNOSIS — M79671 Pain in right foot: Principal | ICD-10-CM

## 2018-10-05 DIAGNOSIS — M1A09X0 Idiopathic chronic gout, multiple sites, without tophus (tophi): Secondary | ICD-10-CM | POA: Diagnosis not present

## 2018-10-05 DIAGNOSIS — M0579 Rheumatoid arthritis with rheumatoid factor of multiple sites without organ or systems involvement: Secondary | ICD-10-CM | POA: Diagnosis not present

## 2018-10-17 ENCOUNTER — Ambulatory Visit: Payer: BLUE CROSS/BLUE SHIELD | Admitting: Family Medicine

## 2018-11-17 ENCOUNTER — Encounter: Payer: Self-pay | Admitting: Family Medicine

## 2018-11-17 ENCOUNTER — Other Ambulatory Visit: Payer: Self-pay | Admitting: Family Medicine

## 2018-11-17 DIAGNOSIS — N529 Male erectile dysfunction, unspecified: Secondary | ICD-10-CM

## 2019-01-02 ENCOUNTER — Other Ambulatory Visit: Payer: Self-pay | Admitting: Family Medicine

## 2019-01-02 DIAGNOSIS — I10 Essential (primary) hypertension: Secondary | ICD-10-CM

## 2019-03-19 ENCOUNTER — Other Ambulatory Visit: Payer: Self-pay | Admitting: Family Medicine

## 2019-03-19 DIAGNOSIS — K219 Gastro-esophageal reflux disease without esophagitis: Secondary | ICD-10-CM

## 2019-04-02 ENCOUNTER — Other Ambulatory Visit: Payer: Self-pay | Admitting: Family Medicine

## 2019-04-02 DIAGNOSIS — E785 Hyperlipidemia, unspecified: Secondary | ICD-10-CM

## 2019-04-03 DIAGNOSIS — M0579 Rheumatoid arthritis with rheumatoid factor of multiple sites without organ or systems involvement: Secondary | ICD-10-CM | POA: Diagnosis not present

## 2019-04-03 DIAGNOSIS — M1A09X0 Idiopathic chronic gout, multiple sites, without tophus (tophi): Secondary | ICD-10-CM | POA: Diagnosis not present

## 2019-04-04 ENCOUNTER — Encounter: Payer: Self-pay | Admitting: Family Medicine

## 2019-05-02 DIAGNOSIS — M1A09X0 Idiopathic chronic gout, multiple sites, without tophus (tophi): Secondary | ICD-10-CM | POA: Diagnosis not present

## 2019-05-02 DIAGNOSIS — M0579 Rheumatoid arthritis with rheumatoid factor of multiple sites without organ or systems involvement: Secondary | ICD-10-CM | POA: Diagnosis not present

## 2019-05-06 ENCOUNTER — Other Ambulatory Visit: Payer: Self-pay | Admitting: Family Medicine

## 2019-05-06 DIAGNOSIS — F5101 Primary insomnia: Secondary | ICD-10-CM

## 2019-05-08 ENCOUNTER — Encounter: Payer: Self-pay | Admitting: Family Medicine

## 2019-05-15 ENCOUNTER — Other Ambulatory Visit: Payer: Self-pay

## 2019-05-21 NOTE — Progress Notes (Addendum)
[facility] [employer] [place], [place], [place] [address] [phone]  Date:  05/23/2019   Name:  Timothy Mcintosh   DOB:  04/18/1966   MRN:  [id number]  PCP:  Darreld Mclean, MD    Chief Complaint: Lab Work (lipid panel, potassium)   History of Present Illness:  Timothy Mcintosh is a 53 y.o. 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 Amil Amen He has 3 daughters and 7 grandchildren- the youngest is 67 month and already walking In November he planned to try Wellbutrin for smoking cessation.  He did not have success, the wellbutrin was keeping him up at night.  He has tried again to quit smoking on his own.  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  He has noted a significant improvement in his joint pains with stopping stain  He held his statin about 5 weeks ago   Discussed shingrix but he prefers to hold off for now  Patient Active Problem List   Diagnosis Date Noted  . Monoallelic mutation of PALB2 gene 05/31/2018  .  Genetic testing 05/31/2018  . Hypertension 05/16/2018  . Left elbow pain 05/16/2018  . Family history of genetic disease carrier   . Family history of breast cancer   . Family history of lung cancer   . Family history of throat cancer   . Post concussion syndrome 09/15/2017  . Restless leg syndrome 09/15/2017  . Rheumatoid arthritis ([place]) 04/01/2016  . Dyslipidemia 12/25/2015  . Tobacco use disorder 12/25/2015  . Insomnia 12/25/2015  . Primary osteoarthritis of left knee 07/30/2015  . GERD (gastroesophageal reflux disease) 03/04/2014  . Major depression 03/04/2014  . Asthmatic bronchitis 09/19/2013  . ED (erectile dysfunction) 04/19/2013  . Dorsalgia 11/21/2011  . Hyperlipidemia 11/21/2011  . Gout 11/21/2011  . GAD (generalized anxiety disorder) 11/21/2011    Past Medical History:  Diagnosis Date  . Family history of breast cancer   . Family history of genetic disease carrier    sister PALB2+  . Family history of lung cancer   . Family history of throat cancer   . Headache   . Hyperlipidemia   . Hypertension   . Rheumatoid arthritis ([place])   . Vision abnormalities     Past Surgical History:  Procedure Laterality Date  . BACK SURGERY Bilateral    c5 -c6  . CARPAL TUNNEL RELEASE Right  Social History   Tobacco Use  . Smoking status: Current Every Day Smoker  . Smokeless tobacco: Never Used  . Tobacco comment: smoking since age 66  Substance Use Topics  . Alcohol use: No    Alcohol/week: 0.0 standard drinks  . Drug use: No    Family History  Problem Relation Age of Onset  . High Cholesterol Father   . Kidney Stones Father   . Heart failure Maternal Grandfather        Diet age 36 of "massive heart failure" unknown if MI or not  . Lung cancer Mother   . Breast cancer Mother 15  . Kidney cancer Mother   . Skin cancer Mother   . Breast cancer Sister 75       PALB2+  . Breast cancer Paternal Grandfather        70's  . Throat cancer Paternal Grandfather    . Other Sister        PALB2+  . Throat cancer Other   . Throat cancer Other     No Known Allergies  Medication list has been reviewed and updated.  Current Outpatient Medications on File Prior to Visit  Medication Sig Dispense Refill  . allopurinol (ZYLOPRIM) 300 MG tablet Take 1 tablet (300 mg total) by mouth daily. 90 tablet 4  . gabapentin (NEURONTIN) 300 MG capsule Take one in the evening and 1 or 2 at bedtime 90 capsule 11  . leflunomide (ARAVA) 20 MG tablet Take 1 tablet (20 mg total) by mouth daily. 30 tablet 0  . lisinopril (PRINIVIL,ZESTRIL) 20 MG tablet TAKE 1 AND 1/2 TABLET BY MOUTH DAILY FOR BLOOD PRESSURE, MAY INCREASE TO 2 TABLETS BY MOUTH DAILY IF NEEDED 180 tablet 1  . Multiple Vitamin (MULTIVITAMIN) capsule Take 1 capsule by mouth daily.     Marland Kitchen omeprazole (PRILOSEC) 40 MG capsule TAKE ONE CAPSULE BY MOUTH DAILY 90 capsule 2  . Probiotic Product (ACIDOPHILUS/GOAT MILK) CAPS Take 1 capsule by mouth daily.     . sildenafil (REVATIO) 20 MG tablet TAKE 1-5 TABLETS DAILY AS NEEDED FOR ERECTILE DYSFUNCTION 50 tablet 4  . simvastatin (ZOCOR) 40 MG tablet TAKE ONE TABLET BY MOUTH DAILY 90 tablet 0  . SUBOXONE 8-2 MG FILM PLACE 2 FILMS SUBLINGUALLY EVERY DAY FOR TREATMENT OF PAIN  0  . Tofacitinib Citrate (XELJANZ) 5 MG TABS Take 1 daily- per rheumatology 30 tablet 0  . traZODone (DESYREL) 100 MG tablet TAKE ONE TABLET BY MOUTH  AT BEDTIME AS NEEDED SLEEP 90 tablet 2  . UNABLE TO FIND Med Name: CBD oil     No current facility-administered medications on file prior to visit.     Review of Systems:  As per HPI- otherwise negative. Walking several miles a day, no CP or SOB with exertion   Physical Examination: Vitals:   05/23/19 0830  BP: 136/80  Pulse: 82  Resp: 16  Temp: 98.4 F (36.9 C)  SpO2: 96%   Vitals:   05/23/19 0830  Weight: 163 lb (73.9 kg)  Height: _0  (1.702 m)   Body mass index is 25.53 kg/m. Ideal Body Weight: Weight in (lb) to have BMI = 25:  159.3  GEN: WDWN, NAD, Non-toxic, A & O x 3, normal weight, looks well.  Smoker  HEENT: Atraumatic, Normocephalic. Neck supple. No masses, No LAD. Ears and Nose: No external deformity. CV: RRR, No M/G/R. No JVD. No thrill. No extra heart sounds. PULM: CTA B, no wheezes, crackles, rhonchi. No  retractions. No resp. distress. No accessory muscle use. ABD: S, NT, ND. No rebound. No HSM. EXTR: No c/c/e NEURO Normal gait.  PSYCH: Normally interactive. Conversant. Not depressed or anxious appearing.  Calm demeanor.    Assessment and Plan:   ICD-10-CM   1. Essential hypertension  I10 CBC    Comprehensive metabolic panel  2. Hyperkalemia  E87.5 Comprehensive metabolic panel  3. Tobacco use disorder  F17.200   4. Dyslipidemia  E78.5 Lipid panel  5. Screening for prostate cancer  Z12.5 PSA  6. Screening for diabetes mellitus  Z13.1 Hemoglobin A1c  7. Screening for HIV (human immunodeficiency virus)  Z11.4 HIV Antibody (routine testing w rflx)  8. Gout, unspecified cause, unspecified chronicity, unspecified site  M10.9 allopurinol (ZYLOPRIM) 300 MG tablet   Following up today BP under ok control Screening labs as above Refilled allopurinol He is not taking statin right now due to body pains.  Will discuss repeat lipids and make plan.  ?Rx omega 3 as his HDL has been low  Reminded that he will need CT lung cancer screening next year   Follow-up: No follow-ups on file.  Meds ordered this encounter  Medications  . allopurinol (ZYLOPRIM) 300 MG tablet    Sig: Take 1 tablet (300 mg total) by mouth daily.    Dispense:  90 tablet    Refill:  4   Orders Placed This Encounter  Procedures  . CBC  . Comprehensive metabolic panel  . Hemoglobin A1c  . Lipid panel  . PSA  . HIV Antibody (routine testing w rflx)    _0 @    Signed Lamar Blinks, MD  Received his labs, message to pt  Results for orders placed or performed in visit on 05/23/19  CBC  Result Value Ref Range   WBC  11.4 (H) 4.0 - 10.5 K/uL   RBC 4.55 4.22 - 5.81 Mil/uL   Platelets 306.0 150.0 - 400.0 K/uL   Hemoglobin 13.4 13.0 - 17.0 g/dL   HCT 40.6 39.0 - 52.0 %   MCV 89.2 78.0 - 100.0 fl   MCHC 33.0 30.0 - 36.0 g/dL   RDW 13.6 11.5 - 15.5 %  Comprehensive metabolic panel  Result Value Ref Range   Sodium 139 135 - 145 mEq/L   Potassium 5.0 3.5 - 5.1 mEq/L   Chloride 105 96 - 112 mEq/L   CO2 28 19 - 32 mEq/L   Glucose, Bld 103 (H) 70 - 99 mg/dL   BUN 21 6 - 23 mg/dL   Creatinine, Ser 0.81 0.40 - 1.50 mg/dL   Total Bilirubin 0.3 0.2 - 1.2 mg/dL   Alkaline Phosphatase 120 (H) 39 - 117 U/L   AST 14 0 - 37 U/L   ALT 13 0 - 53 U/L   Total Protein 6.3 6.0 - 8.3 g/dL   Albumin 4.4 3.5 - 5.2 g/dL   Calcium 9.7 8.4 - 10.5 mg/dL   GFR 99.21 >60.00 mL/min  Hemoglobin A1c  Result Value Ref Range   Hgb A1c MFr Bld 5.8 4.6 - 6.5 %  Lipid panel  Result Value Ref Range   Cholesterol 154 0 - 200 mg/dL   Triglycerides 128.0 0.0 - 149.0 mg/dL   HDL 37.60 (L) >39.00 mg/dL   VLDL 25.6 0.0 - 40.0 mg/dL   LDL Cholesterol 90 0 - 99 mg/dL   Total CHOL/HDL Ratio 4    NonHDL 116.00   PSA  Result Value Ref Range   PSA 0.38 0.10 - 4.00 ng/mL  The 10-year ASCVD risk score Mikey Bussing DC Brooke Bonito., et al., 2013) is: 12%   Values used to calculate the score:     Age: 61 years     Sex: Male     Is Non-Hispanic African American: No     Diabetic: No     Tobacco smoker: Yes     Systolic Blood Pressure: 762 mmHg     Is BP treated: Yes     HDL Cholesterol: 37.6 mg/dL     Total Cholesterol: 154 mg/dL

## 2019-05-23 ENCOUNTER — Encounter: Payer: Self-pay | Admitting: Family Medicine

## 2019-05-23 ENCOUNTER — Ambulatory Visit (INDEPENDENT_AMBULATORY_CARE_PROVIDER_SITE_OTHER): Payer: BC Managed Care – PPO | Admitting: Family Medicine

## 2019-05-23 ENCOUNTER — Other Ambulatory Visit: Payer: Self-pay

## 2019-05-23 VITALS — BP 136/80 | HR 82 | Temp 98.4°F | Resp 16 | Ht 67.0 in | Wt 163.0 lb

## 2019-05-23 DIAGNOSIS — Z114 Encounter for screening for human immunodeficiency virus [HIV]: Secondary | ICD-10-CM

## 2019-05-23 DIAGNOSIS — Z131 Encounter for screening for diabetes mellitus: Secondary | ICD-10-CM

## 2019-05-23 DIAGNOSIS — E785 Hyperlipidemia, unspecified: Secondary | ICD-10-CM | POA: Diagnosis not present

## 2019-05-23 DIAGNOSIS — Z125 Encounter for screening for malignant neoplasm of prostate: Secondary | ICD-10-CM

## 2019-05-23 DIAGNOSIS — I10 Essential (primary) hypertension: Secondary | ICD-10-CM

## 2019-05-23 DIAGNOSIS — F17200 Nicotine dependence, unspecified, uncomplicated: Secondary | ICD-10-CM

## 2019-05-23 DIAGNOSIS — E875 Hyperkalemia: Secondary | ICD-10-CM

## 2019-05-23 DIAGNOSIS — M109 Gout, unspecified: Secondary | ICD-10-CM

## 2019-05-23 LAB — CBC
HCT: 40.6 % (ref 39.0–52.0)
Hemoglobin: 13.4 g/dL (ref 13.0–17.0)
MCHC: 33 g/dL (ref 30.0–36.0)
MCV: 89.2 fl (ref 78.0–100.0)
Platelets: 306 10*3/uL (ref 150.0–400.0)
RBC: 4.55 Mil/uL (ref 4.22–5.81)
RDW: 13.6 % (ref 11.5–15.5)
WBC: 11.4 10*3/uL — ABNORMAL HIGH (ref 4.0–10.5)

## 2019-05-23 LAB — COMPREHENSIVE METABOLIC PANEL
ALT: 13 U/L (ref 0–53)
AST: 14 U/L (ref 0–37)
Albumin: 4.4 g/dL (ref 3.5–5.2)
Alkaline Phosphatase: 120 U/L — ABNORMAL HIGH (ref 39–117)
BUN: 21 mg/dL (ref 6–23)
CO2: 28 mEq/L (ref 19–32)
Calcium: 9.7 mg/dL (ref 8.4–10.5)
Chloride: 105 mEq/L (ref 96–112)
Creatinine, Ser: 0.81 mg/dL (ref 0.40–1.50)
GFR: 99.21 mL/min (ref >60.00)
Glucose, Bld: 103 mg/dL — ABNORMAL HIGH (ref 70–99)
Potassium: 5 mEq/L (ref 3.5–5.1)
Sodium: 139 mEq/L (ref 135–145)
Total Bilirubin: 0.3 mg/dL (ref 0.2–1.2)
Total Protein: 6.3 g/dL (ref 6.0–8.3)

## 2019-05-23 LAB — PSA: PSA: 0.38 ng/mL (ref 0.10–4.00)

## 2019-05-23 LAB — LIPID PANEL
Cholesterol: 154 mg/dL (ref 0–200)
HDL: 37.6 mg/dL — ABNORMAL LOW (ref >39.00)
LDL Cholesterol: 90 mg/dL (ref 0–99)
NonHDL: 116
Total CHOL/HDL Ratio: 4
Triglycerides: 128 mg/dL (ref 0.0–149.0)
VLDL: 25.6 mg/dL (ref 0.0–40.0)

## 2019-05-23 LAB — HEMOGLOBIN A1C: Hgb A1c MFr Bld: 5.8 % (ref 4.6–6.5)

## 2019-05-23 MED ORDER — ALLOPURINOL 300 MG PO TABS: 300.0000 mg | ORAL_TABLET | Freq: Every day | ORAL | 4 refills | Status: DC

## 2019-05-23 NOTE — Patient Instructions (Signed)
Great to see you as always- take care and I will be in touch with your labs We will look at your lipids and decide if we should try a medication for you again Next year you will be eligible for annual CT scan screening for lung cancer Do think about getting the shingles vaccine- shingrix. We can do this at your convenience  Let's visit in 6 months, take care!

## 2019-05-24 LAB — HIV ANTIBODY (ROUTINE TESTING W REFLEX): HIV 1&2 Ab, 4th Generation: NONREACTIVE

## 2019-06-27 ENCOUNTER — Other Ambulatory Visit: Payer: Self-pay | Admitting: Family Medicine

## 2019-06-27 DIAGNOSIS — N529 Male erectile dysfunction, unspecified: Secondary | ICD-10-CM

## 2019-06-30 ENCOUNTER — Other Ambulatory Visit: Payer: Self-pay | Admitting: Family Medicine

## 2019-06-30 DIAGNOSIS — I10 Essential (primary) hypertension: Secondary | ICD-10-CM

## 2019-06-30 DIAGNOSIS — E785 Hyperlipidemia, unspecified: Secondary | ICD-10-CM

## 2019-07-04 ENCOUNTER — Other Ambulatory Visit: Payer: Self-pay | Admitting: Neurology

## 2019-07-05 ENCOUNTER — Encounter: Payer: Self-pay | Admitting: Family Medicine

## 2019-07-05 MED ORDER — GABAPENTIN 300 MG PO CAPS: ORAL_CAPSULE | ORAL | 11 refills | Status: DC

## 2019-08-27 ENCOUNTER — Encounter: Payer: Self-pay | Admitting: Family Medicine

## 2019-08-28 ENCOUNTER — Ambulatory Visit (INDEPENDENT_AMBULATORY_CARE_PROVIDER_SITE_OTHER): Payer: BC Managed Care – PPO | Admitting: Medical

## 2019-08-28 ENCOUNTER — Ambulatory Visit (HOSPITAL_BASED_OUTPATIENT_CLINIC_OR_DEPARTMENT_OTHER)
Admission: RE | Admit: 2019-08-28 | Discharge: 2019-08-28 | Disposition: A | Discharge status: Home / Self Care | Payer: BC Managed Care – PPO | Source: Ambulatory Visit | Attending: Medical | Admitting: Medical

## 2019-08-28 ENCOUNTER — Other Ambulatory Visit: Payer: Self-pay

## 2019-08-28 ENCOUNTER — Encounter: Payer: Self-pay | Admitting: Medical

## 2019-08-28 VITALS — BP 150/77 | HR 74 | Temp 98.2°F | Resp 16 | Ht 67.0 in | Wt 167.4 lb

## 2019-08-28 DIAGNOSIS — M545 Low back pain, unspecified: Secondary | ICD-10-CM

## 2019-08-28 DIAGNOSIS — M25559 Pain in unspecified hip: Secondary | ICD-10-CM | POA: Diagnosis not present

## 2019-08-28 LAB — POC URINALSYSI DIPSTICK (AUTOMATED)
Bilirubin, UA: NEGATIVE
Blood, UA: NEGATIVE
Glucose, UA: NEGATIVE
Ketones, UA: NEGATIVE
Leukocytes, UA: NEGATIVE
Nitrite, UA: NEGATIVE
Protein, UA: NEGATIVE
Spec Grav, UA: 1.015 (ref 1.010–1.025)
Urobilinogen, UA: NEGATIVE E.U./dL — AB
pH, UA: 6 (ref 5.0–8.0)

## 2019-08-28 MED ORDER — PREDNISONE 10 MG PO TABS: ORAL_TABLET | ORAL | 0 refills | Status: DC

## 2019-08-28 NOTE — Patient Instructions (Signed)
You had recent moderate to severe left sciatic region pain with some left hip pain and groin pain.  Based on your history of significant low back imaging studies, I do think is a good idea to go ahead and repeat lumbar spine x-ray to assess joint spaces.  Also will get x-ray of your hip to evaluate if significant degenerative changes.  Your urine study came back clear/negative.  Presently would recommend 6-day taper dose of prednisone.  Discontinue ibuprofen while on prednisone and can continue Suboxone.  We will follow x-ray results and notify you.  If pain persisting despite treatment then consider referral to sports medicine for possible MRI of lower back?  Follow-up date to be determined.

## 2019-08-28 NOTE — Progress Notes (Deleted)
[facility] at [facility] [address], [place], [place] [address] [phone]  Date:  08/29/2019   Name:  Timothy Mcintosh   DOB:  1966-05-05   MRN:  [id number]  PCP:  Darreld Mclean, MD    Chief Complaint: No chief complaint on file.   History of Present Illness:  Timothy Mcintosh is a 53 y.o. very pleasant male patient who presents with the following:  Patient with history of hypertension, dyslipidemia, rheumatoid arthritis, strong family history of cancer He had contacted me recently due to increased back and hip pain: Hi Dr. Lorelei Pont. I'm having a lot of pain in my lower back/hip/groin and have for about a week now. I have a bad disc in my lumbar and thought I may have just aggravated it. It seemed to get a little better after a couple of days, but since has gotten worse.  I'm taking 800 mg of ibuprofen every four hours, as well as the suboxone that I take daily. That, and a heating pad, provide some temporary relief.  Unless I just aggravated it again, it usually doesn't bother me this long, or this bad. Please let me know your thoughts.   I saw him most recently in July for routine well visit  His rheumatologist is Dr. Amil Amen  Flu shot:  07/31/2019  1   07/31/2019  Buprenorp-Nalox 8-2 MG SL Film  56.00  28 Cl Hal   [id number]   [place] ([id number])   0  16.00 mg  Private Pay   [place]  07/03/2019  1   07/03/2019  Buprenorp-Nalox 8-2 MG SL Film  56.00  28 Cl Hal   [id number]   [place] ([id number])   0  16.00 mg  Private Pay   [place]  06/05/2019  1   06/05/2019  Buprenorp-Nalox 8-2 MG SL Film  58.00  29 Cl Hal   [id number]   [place] ([id number])   0  16.00 mg  Private Pay   [place]  05/07/2019  1   05/07/2019  Buprenorp-Nalox 8-2 MG SL Film  58.00  29 Cl Hal   [id number]   [place] ([id number])   0  16.00 mg  Private Pay   [place]  04/09/2019  1   04/09/2019  Buprenorp-Nalox 8-2 MG SL Film  56.00  28 Cl Hal   [id number]   [place] ([id number])   0  16.00 mg  Private Pay   [place]  03/12/2019  1   03/12/2019   Buprenorp-Nalox 8-2 MG SL Film  56.00  28 Cl Hal   [id number]   [place] ([id number])   0  16.00 mg       Patient Active Problem List   Diagnosis Date Noted  . Monoallelic mutation of PALB2 gene 05/31/2018  . Genetic testing 05/31/2018  . Hypertension 05/16/2018  . Left elbow pain 05/16/2018  . Family history of genetic disease carrier   . Family history of breast cancer   . Family history of lung cancer   . Family history of throat cancer   . Post concussion syndrome 09/15/2017  . Restless leg syndrome 09/15/2017  . Rheumatoid arthritis ([place]) 04/01/2016  . Dyslipidemia 12/25/2015  . Tobacco use disorder 12/25/2015  . Insomnia 12/25/2015  . Primary osteoarthritis of left knee 07/30/2015  . GERD (gastroesophageal reflux disease) 03/04/2014  . Major depression 03/04/2014  . Asthmatic bronchitis 09/19/2013  . ED (erectile dysfunction) 04/19/2013  . Dorsalgia 11/21/2011  . Hyperlipidemia 11/21/2011  .  Gout 11/21/2011  . GAD (generalized anxiety disorder) 11/21/2011    Past Medical History:  Diagnosis Date  . Family history of breast cancer   . Family history of genetic disease carrier    sister PALB2+  . Family history of lung cancer   . Family history of throat cancer   . Headache   . Hyperlipidemia   . Hypertension   . Rheumatoid arthritis ([place])   . Vision abnormalities     Past Surgical History:  Procedure Laterality Date  . BACK SURGERY Bilateral    c5 -c6  . CARPAL TUNNEL RELEASE Right     Social History   Tobacco Use  . Smoking status: Current Every Day Smoker  . Smokeless tobacco: Never Used  . Tobacco comment: smoking since age 1  Substance Use Topics  . Alcohol use: No    Alcohol/week: 0.0 standard drinks  . Drug use: No    Family History  Problem Relation Age of Onset  . High Cholesterol Father   . Kidney Stones Father   . Heart failure Maternal Grandfather        Diet age 57 of "massive heart failure" unknown if MI or not  . Lung cancer Mother   .  Breast cancer Mother 23  . Kidney cancer Mother   . Skin cancer Mother   . Breast cancer Sister 27       PALB2+  . Breast cancer Paternal Grandfather        74's  . Throat cancer Paternal Grandfather   . Other Sister        PALB2+  . Throat cancer Other   . Throat cancer Other     No Known Allergies  Medication list has been reviewed and updated.  Current Outpatient Medications on File Prior to Visit  Medication Sig Dispense Refill  . allopurinol (ZYLOPRIM) 300 MG tablet Take 1 tablet (300 mg total) by mouth daily. 90 tablet 4  . gabapentin (NEURONTIN) 300 MG capsule Take one in the evening and 1 or 2 at bedtime 90 capsule 11  . lisinopril (ZESTRIL) 20 MG tablet TAKE 1 AND 1/2 TABLET BY MOUTH DAILY FOR BLOOD PRESSURE, MAY INCREASE TO 2 TABLETS BY MOUTH DAILY IF NEEDED 180 tablet 1  . Multiple Vitamin (MULTIVITAMIN) capsule Take 1 capsule by mouth daily.     Marland Kitchen omeprazole (PRILOSEC) 40 MG capsule TAKE ONE CAPSULE BY MOUTH DAILY 90 capsule 2  . Probiotic Product (ACIDOPHILUS/GOAT MILK) CAPS Take 1 capsule by mouth daily.     . sildenafil (REVATIO) 20 MG tablet TAKE 1 TO 5 TABLETS BY MOUTH DAILY AS NEEDED FOR FOR ERECTILE DYSFUNCTION 50 tablet 3  . simvastatin (ZOCOR) 40 MG tablet TAKE ONE TABLET BY MOUTH DAILY 90 tablet 1  . SUBOXONE 8-2 MG FILM PLACE 2 FILMS SUBLINGUALLY EVERY DAY FOR TREATMENT OF PAIN  0  . traZODone (DESYREL) 100 MG tablet TAKE ONE TABLET BY MOUTH  AT BEDTIME AS NEEDED SLEEP 90 tablet 2  . UNABLE TO FIND Med Name: CBD oil     No current facility-administered medications on file prior to visit.     Review of Systems:  As per HPI- otherwise negative.   Physical Examination: There were no vitals filed for this visit. There were no vitals filed for this visit. There is no height or weight on file to calculate BMI. Ideal Body Weight:    GEN: WDWN, NAD, Non-toxic, A & O x 3 HEENT: Atraumatic, Normocephalic. Neck supple. No  masses, No LAD. Ears and Nose: No  external deformity. CV: RRR, No M/G/R. No JVD. No thrill. No extra heart sounds. PULM: CTA B, no wheezes, crackles, rhonchi. No retractions. No resp. distress. No accessory muscle use. ABD: S, NT, ND, +BS. No rebound. No HSM. EXTR: No c/c/e NEURO Normal gait.  PSYCH: Normally interactive. Conversant. Not depressed or anxious appearing.  Calm demeanor.    Assessment and Plan: ***  Signed Lamar Blinks, MD

## 2019-08-28 NOTE — Progress Notes (Signed)
Subjective:    Patient ID: Timothy Mcintosh, male    DOB: Apr 30, 1966, 53 y.o.   MRN: [id number]  HPI  Pt in today with some history of back pain. He states told lower back disc issues for years as far back as  89. Pt states pain has been intermittent and controlled lasting only 3-4 days. Pt states he had surgeon who recommended surgery but pt declined after having tough recovery after neck surgery back then.  Pt states one week ago today started to get lower back pain on left side. By Tuesday evening pain was severe. Radiates to his left groin area.   Wednesday pain decreased. Thursday pain increased again but not severe. Yesterday morning pain was severe. No pain radiating to foot/down his leg. Bending over to tie shoes pain is severe.   Pt taking 800 mg of ibuprofen daily every 4 hours.. Pt used to be on narcotics in 90's. He is on suboxone after he was on narcotic for years.  [facility] for [facility].  Pt also tied some zanaflex and did not see much improvement.  Some hx of hip pain as well.     Review of Systems  Constitutional: Negative for chills, fatigue and fever.  Respiratory: Negative for shortness of breath and wheezing.   Cardiovascular: Negative for chest pain and palpitations.  Gastrointestinal: Negative for abdominal pain.  Musculoskeletal: Positive for back pain. Negative for myalgias and neck stiffness.  Skin: Negative for rash.  Neurological: Negative for dizziness, weakness, light-headedness and headaches.  Hematological: Negative for adenopathy. Does not bruise/bleed easily.  Psychiatric/Behavioral: Negative for behavioral problems, confusion and sleep disturbance. The patient is not nervous/anxious.     Past Medical History:  Diagnosis Date  . Family history of breast cancer   . Family history of genetic disease carrier    sister PALB2+  . Family history of lung cancer   . Family history of throat cancer   . Headache   . Hyperlipidemia   .  Hypertension   . Rheumatoid arthritis ([place])   . Vision abnormalities      Social History   Socioeconomic History  . Marital status: Married    Spouse name: Not on file  . Number of children: Not on file  . Years of education: Not on file  . Highest education level: Not on file  Occupational History  . Not on file  Social Needs  . Financial resource strain: Not on file  . Food insecurity    Worry: Not on file    Inability: Not on file  . Transportation needs    Medical: Not on file    Non-medical: Not on file  Tobacco Use  . Smoking status: Current Every Day Smoker  . Smokeless tobacco: Never Used  . Tobacco comment: smoking since age 38  Substance and Sexual Activity  . Alcohol use: No    Alcohol/week: 0.0 standard drinks  . Drug use: No  . Sexual activity: Not on file  Lifestyle  . Physical activity    Days per week: Not on file    Minutes per session: Not on file  . Stress: Not on file  Relationships  . Social Herbalist on phone: Not on file    Gets together: Not on file    Attends religious service: Not on file    Active member of club or organization: Not on file    Attends meetings of clubs or organizations: Not on file  Relationship status: Not on file  . Intimate partner violence    Fear of current or ex partner: Not on file    Emotionally abused: Not on file    Physically abused: Not on file    Forced sexual activity: Not on file  Other Topics Concern  . Not on file  Social History Narrative  . Not on file    Past Surgical History:  Procedure Laterality Date  . BACK SURGERY Bilateral    c5 -c6  . CARPAL TUNNEL RELEASE Right     Family History  Problem Relation Age of Onset  . High Cholesterol Father   . Kidney Stones Father   . Heart failure Maternal Grandfather        Diet age 9 of "massive heart failure" unknown if MI or not  . Lung cancer Mother   . Breast cancer Mother 45  . Kidney cancer Mother   . Skin cancer Mother    . Breast cancer Sister 48       PALB2+  . Breast cancer Paternal Grandfather        99's  . Throat cancer Paternal Grandfather   . Other Sister        PALB2+  . Throat cancer Other   . Throat cancer Other     No Known Allergies  Current Outpatient Medications on File Prior to Visit  Medication Sig Dispense Refill  . allopurinol (ZYLOPRIM) 300 MG tablet Take 1 tablet (300 mg total) by mouth daily. 90 tablet 4  . gabapentin (NEURONTIN) 300 MG capsule Take one in the evening and 1 or 2 at bedtime 90 capsule 11  . lisinopril (ZESTRIL) 20 MG tablet TAKE 1 AND 1/2 TABLET BY MOUTH DAILY FOR BLOOD PRESSURE, MAY INCREASE TO 2 TABLETS BY MOUTH DAILY IF NEEDED 180 tablet 1  . Multiple Vitamin (MULTIVITAMIN) capsule Take 1 capsule by mouth daily.     Marland Kitchen omeprazole (PRILOSEC) 40 MG capsule TAKE ONE CAPSULE BY MOUTH DAILY 90 capsule 2  . Probiotic Product (ACIDOPHILUS/GOAT MILK) CAPS Take 1 capsule by mouth daily.     . sildenafil (REVATIO) 20 MG tablet TAKE 1 TO 5 TABLETS BY MOUTH DAILY AS NEEDED FOR FOR ERECTILE DYSFUNCTION 50 tablet 3  . simvastatin (ZOCOR) 40 MG tablet TAKE ONE TABLET BY MOUTH DAILY 90 tablet 1  . SUBOXONE 8-2 MG FILM PLACE 2 FILMS SUBLINGUALLY EVERY DAY FOR TREATMENT OF PAIN  0  . traZODone (DESYREL) 100 MG tablet TAKE ONE TABLET BY MOUTH  AT BEDTIME AS NEEDED SLEEP 90 tablet 2  . UNABLE TO FIND Med Name: CBD oil     No current facility-administered medications on file prior to visit.     BP (!) 150/77   Pulse 74   Temp 98.2 F (36.8 C) (Temporal)   Resp 16   Ht 5' 7"  (1.702 m)   Wt 167 lb 6.4 oz (75.9 kg)   SpO2 100%   BMI 26.22 kg/m       Objective:   Physical Exam  General Appearance- Not in acute distress.    Chest and Lung Exam Auscultation: Breath sounds:-Normal. Clear even and unlabored. Adventitious sounds:- No Adventitious sounds.  Cardiovascular Auscultation:Rythm - Regular, rate and rythm. Heart Sounds -Normal heart sounds.  Abdomen  Inspection:-Inspection Normal.  Palpation/Perucssion: Palpation and Percussion of the abdomen reveal- Non Tender, No Rebound tenderness, No rigidity(Guarding) and No Palpable abdominal masses.  Liver:-Normal.  Spleen:- Normal.   Back No Mid lumbar spine tenderness to  palpation. Left si tenderness directly. Pain on straight leg lift. Pain on lateral movements and flexion/extension of the spine.  No cva tenderness bilaterally.  Lower ext neurologic  L5-S1 sensation intact bilaterally. Normal patellar reflexes bilaterally. No foot drop bilaterally.  Left hip-on range of motion mild pain.  On log roll of left thigh patient reports groin region pain.         Assessment & Plan:  You had recent moderate to severe left sciatic region pain with some left hip pain and groin pain.  Based on your history of significant low back imaging studies, I do think is a good idea to go ahead and repeat lumbar spine x-ray to assess joint spaces.  Also will get x-ray of your hip to evaluate if significant degenerative changes.  Your urine study came back clear/negative.  Presently would recommend 6-day taper dose of prednisone.  Discontinue ibuprofen while on prednisone and can continue Suboxone.  We will follow x-ray results and notify you.  If pain persisting despite treatment then consider referral to sports medicine for possible MRI of lower back?  Follow-up date to be determined.  25 minutes spent with patient.  50% of time spent counseling patient on plan going forward.

## 2019-08-29 ENCOUNTER — Ambulatory Visit: Payer: BC Managed Care – PPO | Admitting: Family Medicine

## 2019-08-31 ENCOUNTER — Encounter: Payer: Self-pay | Admitting: Medical

## 2019-09-01 ENCOUNTER — Telehealth: Payer: Self-pay | Admitting: Medical

## 2019-09-01 DIAGNOSIS — M545 Low back pain, unspecified: Secondary | ICD-10-CM

## 2019-09-01 DIAGNOSIS — M25559 Pain in unspecified hip: Secondary | ICD-10-CM

## 2019-09-01 NOTE — Telephone Encounter (Signed)
Referral to orthopedist placed. 

## 2019-09-04 ENCOUNTER — Ambulatory Visit (INDEPENDENT_AMBULATORY_CARE_PROVIDER_SITE_OTHER): Payer: BC Managed Care – PPO | Admitting: Family Medicine

## 2019-09-04 ENCOUNTER — Ambulatory Visit: Payer: Self-pay

## 2019-09-04 ENCOUNTER — Other Ambulatory Visit: Payer: Self-pay | Admitting: Family Medicine

## 2019-09-04 ENCOUNTER — Encounter: Payer: Self-pay | Admitting: Family Medicine

## 2019-09-04 ENCOUNTER — Other Ambulatory Visit: Payer: Self-pay

## 2019-09-04 VITALS — BP 156/79 | HR 82 | Ht 67.0 in | Wt 160.0 lb

## 2019-09-04 DIAGNOSIS — M1612 Unilateral primary osteoarthritis, left hip: Secondary | ICD-10-CM

## 2019-09-04 DIAGNOSIS — R1032 Left lower quadrant pain: Secondary | ICD-10-CM | POA: Diagnosis not present

## 2019-09-04 DIAGNOSIS — M179 Osteoarthritis of knee, unspecified: Secondary | ICD-10-CM

## 2019-09-04 DIAGNOSIS — M23306 Other meniscus derangements, unspecified meniscus, right knee: Secondary | ICD-10-CM

## 2019-09-04 DIAGNOSIS — M1710 Unilateral primary osteoarthritis, unspecified knee: Secondary | ICD-10-CM | POA: Insufficient documentation

## 2019-09-04 HISTORY — DX: Osteoarthritis of knee, unspecified: M17.9

## 2019-09-04 HISTORY — DX: Left lower quadrant pain: R10.32

## 2019-09-04 HISTORY — DX: Unilateral primary osteoarthritis, left hip: M16.12

## 2019-09-04 MED ORDER — TRIAMCINOLONE ACETONIDE 40 MG/ML IJ SUSP
40.0000 mg | Freq: Once | INTRAMUSCULAR | Status: AC
  Administered 2019-09-04: 10:00:00 40 mg via INTRA_ARTICULAR

## 2019-09-04 MED ORDER — PENNSAID 2 % TD SOLN: 1.0000 "application " | Freq: Two times a day (BID) | TRANSDERMAL | 3 refills | Status: DC

## 2019-09-04 MED ORDER — TRIAMCINOLONE ACETONIDE 40 MG/ML IJ SUSP
40.0000 mg | Freq: Once | INTRAMUSCULAR | Status: AC
  Administered 2019-09-04: 40 mg via INTRA_ARTICULAR

## 2019-09-04 NOTE — Addendum Note (Signed)
Addended by: Sherrie George F on: 09/04/2019 09:40 AM   Modules accepted: Orders

## 2019-09-04 NOTE — Patient Instructions (Signed)
Nice to meet you Please try ice for the knee  Please try the exercises for both  Please try the rub on medicine  Please try a semi rigid insole  I will call with the results from today   Please send me a message in [place] with any questions or updates.  Please see me back in 4 weeks.   --Dr. Raeford Razor

## 2019-09-04 NOTE — Assessment & Plan Note (Signed)
Severe degenerative changes appreciated on most recent x-ray.  Likely contributing to some of the back pain he is experiencing as well. -Joint injection. -Counseled on home exercise therapy and supportive care. -Discussed the possibility of surgery and will hold off for now. -Could consider physical therapy.

## 2019-09-04 NOTE — Assessment & Plan Note (Signed)
History of arthroscopy a few years ago.  Minimal degenerative changes from the x-ray from 2017.  Likely component of degenerative meniscus as well. -Injection. -Pennsaid. -Counseled on home exercise therapy and supportive care. -Can consider physical therapy or gel injections.

## 2019-09-04 NOTE — Progress Notes (Signed)
Timothy Mcintosh - 53 y.o. male MRN [id number]  Date of birth: 1966-01-28  SUBJECTIVE:  Including CC & ROS.  Chief Complaint  Patient presents with  . Back Pain    left-sided low back    Timothy Mcintosh is a 53 y.o. male that is presenting with right knee pain, left hip pain, and left quadrant abdominal pain.  The left hip pain is been ongoing for about 2 weeks.  The pain seems to be worse with certain movements.  Denies any specific inciting event.  Pain seems to be localized to the hip itself.  Pain can be moderate to severe.  He stands most of the day and seems to be exacerbated with prolonged standing.  No prior history of surgery.  Little improvement with medications thus far  The left lower quadrant abdominal pain is been ongoing for about a week.  He did get improvement with the prednisone.  No prior history of abdominal pain.  The pain was the most intense severe pain he is ever experienced.  It seemed like someone was stabbing him in this area.  He has had changes in his bowel movements ranging from constipation to normal stools.  He has had some fullness as well as swelling in his left lower quadrant.  He has noticed some red tense to his stool to suggest blood.  Reports a normal colonoscopy.  Denies fevers.  Pain is worse with any Valsalva or lifting.  Right knee pain is acute on chronic in nature.  He had an arthroscopy performed about 5 years ago.  The pain seems to be worse at the end of the day.  It is localized to the knee.  He has swelling from time to time.  Has tried Voltaren gel which seems to offer some improvement.  Denies any mechanical symptoms.  Pain it can be mild to moderate.  No recent injections.  Pain can be sharp and stabbing..  Independent review of the left hip x-ray from 10/27 shows significant degenerative changes of the joint.  Independent review of the lumbar spine x-ray from 10/27 shows degenerative change of the facet joints in the lower lumbar spine.  Independent  review of the right knee x-ray from 2017 shows mild medial joint space narrowing.   Review of Systems  Constitutional: Negative for fever.  HENT: Negative for congestion.   Respiratory: Negative for cough.   Cardiovascular: Negative for chest pain.  Gastrointestinal: Positive for abdominal distention, abdominal pain and constipation.  Musculoskeletal: Positive for joint swelling.  Skin: Negative for color change.  Neurological: Negative for weakness.  Hematological: Negative for adenopathy.    HISTORY: Past Medical, Surgical, Social, and Family History Reviewed & Updated per EMR.   Pertinent Historical Findings include:  Past Medical History:  Diagnosis Date  . Family history of breast cancer   . Family history of genetic disease carrier    sister PALB2+  . Family history of lung cancer   . Family history of throat cancer   . Headache   . Hyperlipidemia   . Hypertension   . Rheumatoid arthritis ([place])   . Vision abnormalities     Past Surgical History:  Procedure Laterality Date  . BACK SURGERY Bilateral    c5 -c6  . CARPAL TUNNEL RELEASE Right     No Known Allergies  Family History  Problem Relation Age of Onset  . High Cholesterol Father   . Kidney Stones Father   . Heart failure Maternal Grandfather  Diet age 34 of "massive heart failure" unknown if MI or not  . Lung cancer Mother   . Breast cancer Mother 42  . Kidney cancer Mother   . Skin cancer Mother   . Breast cancer Sister 69       PALB2+  . Breast cancer Paternal Grandfather        49's  . Throat cancer Paternal Grandfather   . Other Sister        PALB2+  . Throat cancer Other   . Throat cancer Other      Social History   Socioeconomic History  . Marital status: Married    Spouse name: Not on file  . Number of children: Not on file  . Years of education: Not on file  . Highest education level: Not on file  Occupational History  . Not on file  Social Needs  . Financial resource  strain: Not on file  . Food insecurity    Worry: Not on file    Inability: Not on file  . Transportation needs    Medical: Not on file    Non-medical: Not on file  Tobacco Use  . Smoking status: Current Every Day Smoker  . Smokeless tobacco: Never Used  . Tobacco comment: smoking since age 19  Substance and Sexual Activity  . Alcohol use: No    Alcohol/week: 0.0 standard drinks  . Drug use: No  . Sexual activity: Not on file  Lifestyle  . Physical activity    Days per week: Not on file    Minutes per session: Not on file  . Stress: Not on file  Relationships  . Social Herbalist on phone: Not on file    Gets together: Not on file    Attends religious service: Not on file    Active member of club or organization: Not on file    Attends meetings of clubs or organizations: Not on file    Relationship status: Not on file  . Intimate partner violence    Fear of current or ex partner: Not on file    Emotionally abused: Not on file    Physically abused: Not on file    Forced sexual activity: Not on file  Other Topics Concern  . Not on file  Social History Narrative  . Not on file     PHYSICAL EXAM:  VS: BP (!) 156/79   Pulse 82   Ht 5' 7"  (1.702 m)   Wt 160 lb (72.6 kg)   BMI 25.06 kg/m  Physical Exam Gen: NAD, alert, cooperative with exam, well-appearing ENT: normal lips, normal nasal mucosa,  Eye: normal EOM, normal conjunctiva and lids CV:  no edema, +2 pedal pulses   Resp: no accessory muscle use, non-labored,  GI: Fullness in the left lower quadrant, no appreciated hernia but there is a type of fluctuance in this area, soft, tender in the left lower quadrant,  Skin: no rashes, no areas of induration  Neuro: normal tone, normal sensation to touch Psych:  normal insight, alert and oriented MSK:  Left hip: Limited internal rotation compared to the contralateral side. Normal external rotation. Normal strength resistance with hip flexion. Negative  straight leg raise. Right knee: No effusion. Normal range of motion. No instability. No tenderness to palpation over the medial or lateral joint line. Positive Murray's test. Neurovascularly intact   Aspiration/Injection Procedure Note Timothy Mcintosh [birth year]  Procedure: Injection Indications: Right knee pain  Procedure Details Consent:  Risks of procedure as well as the alternatives and risks of each were explained to the (patient/caregiver).  Consent for procedure obtained. Time Out: Verified patient identification, verified procedure, site/side was marked, verified correct patient position, special equipment/implants available, medications/allergies/relevent history reviewed, required imaging and test results available.  Performed.  The area was cleaned with iodine and alcohol swabs.    The right knee superior lateral suprapatellar pouch was injected using 1 cc's of 40 mg Kenalog and 4 cc's of 0.25% bupivacaine with a 22 1 1/2" needle.  Ultrasound was used. Images were obtained in long views showing the injection.     A sterile dressing was applied.  Patient did tolerate procedure well.   Aspiration/Injection Procedure Note Timothy Mcintosh 02/26/1966  Procedure: Injection Indications: Left hip pain  Procedure Details Consent: Risks of procedure as well as the alternatives and risks of each were explained to the (patient/caregiver).  Consent for procedure obtained. Time Out: Verified patient identification, verified procedure, site/side was marked, verified correct patient position, special equipment/implants available, medications/allergies/relevent history reviewed, required imaging and test results available.  Performed.  The area was cleaned with iodine and alcohol swabs.    The left hip joint was injected using 1 cc's of 40 mg Kenalog and 4 cc's of 0.25% bupivacaine with a 22 3 1/2" needle.  Ultrasound was used. Images were obtained in long views showing the injection.      A sterile dressing was applied.  Patient did tolerate procedure well.       ASSESSMENT & PLAN:   Degenerative tear of meniscus of right knee History of arthroscopy a few years ago.  Minimal degenerative changes from the x-ray from 2017.  Likely component of degenerative meniscus as well. -Injection. -Pennsaid. -Counseled on home exercise therapy and supportive care. -Can consider physical therapy or gel injections.  Primary osteoarthritis of left hip Severe degenerative changes appreciated on most recent x-ray.  Likely contributing to some of the back pain he is experiencing as well. -Joint injection. -Counseled on home exercise therapy and supportive care. -Discussed the possibility of surgery and will hold off for now. -Could consider physical therapy.  Left lower quadrant abdominal pain Pain ongoing for 1 week with no significant improvement.  Has had bowel movement changes as well as suggestions of blood in his stool.  No prior history of diverticulitis.  No other family history of inflammatory bowel changes.  Upon chart review there appears to be a genetic testing that would suggest a possible increased risk of cancer. -BMP. -CT abdomen and pelvis to evaluate for diverticulitis

## 2019-09-04 NOTE — Assessment & Plan Note (Signed)
Pain ongoing for 1 week with no significant improvement.  Has had bowel movement changes as well as suggestions of blood in his stool.  No prior history of diverticulitis.  No other family history of inflammatory bowel changes.  Upon chart review there appears to be a genetic testing that would suggest a possible increased risk of cancer. -BMP. -CT abdomen and pelvis to evaluate for diverticulitis

## 2019-09-05 LAB — BASIC METABOLIC PANEL
BUN/Creatinine Ratio: 16 (ref 9–20)
BUN: 13 mg/dL (ref 6–24)
CO2: 23 mmol/L (ref 20–29)
Calcium: 9.9 mg/dL (ref 8.7–10.2)
Chloride: 103 mmol/L (ref 96–106)
Creatinine, Ser: 0.81 mg/dL (ref 0.76–1.27)
GFR calc Af Amer: 116 mL/min/{1.73_m2} (ref >59)
GFR calc non Af Amer: 101 mL/min/{1.73_m2} (ref >59)
Glucose: 120 mg/dL — ABNORMAL HIGH (ref 65–99)
Potassium: 4.8 mmol/L (ref 3.5–5.2)
Sodium: 139 mmol/L (ref 134–144)

## 2019-09-06 ENCOUNTER — Encounter: Payer: Self-pay | Admitting: Family Medicine

## 2019-09-06 ENCOUNTER — Other Ambulatory Visit: Payer: Self-pay

## 2019-09-06 ENCOUNTER — Ambulatory Visit (INDEPENDENT_AMBULATORY_CARE_PROVIDER_SITE_OTHER): Payer: BC Managed Care – PPO

## 2019-09-06 DIAGNOSIS — R1032 Left lower quadrant pain: Secondary | ICD-10-CM

## 2019-09-06 MED ORDER — IOHEXOL 300 MG/ML  SOLN
100.0000 mL | Freq: Once | INTRAMUSCULAR | Status: AC | PRN
  Administered 2019-09-06: 100 mL via INTRAVENOUS

## 2019-09-07 ENCOUNTER — Other Ambulatory Visit: Payer: Self-pay

## 2019-09-07 ENCOUNTER — Ambulatory Visit (INDEPENDENT_AMBULATORY_CARE_PROVIDER_SITE_OTHER): Payer: BC Managed Care – PPO | Admitting: Family Medicine

## 2019-09-07 DIAGNOSIS — R1032 Left lower quadrant pain: Secondary | ICD-10-CM | POA: Diagnosis not present

## 2019-09-07 NOTE — Assessment & Plan Note (Signed)
Findings on CT were normal.  He may have a sports hernia that was not apparent on the CT scan.  His CT did show significant degenerative changes of the left hip joint. -Counseled on supportive care. -Follow-up as needed.

## 2019-09-07 NOTE — Progress Notes (Signed)
Virtual Visit via Video Note  I connected with Heaven Wandell on 09/07/19 at  9:30 AM EST by a video enabled telemedicine application and verified that I am speaking with the correct person using two identifiers.   I discussed the limitations of evaluation and management by telemedicine and the availability of in person appointments. The patient expressed understanding and agreed to proceed.  History of Present Illness:  [name] is a 53 yo M that is following up after his CT ab/pelvis.  There are no findings of a direct or indirect hernia.  There is no findings of infection or of diverticulitis or bowel perforation.     Observations/Objective:  Gen: NAD, alert, cooperative with exam, well-appearing ENT: normal lips, normal nasal mucosa,  Eye: normal EOM, normal conjunctiva and lids Resp: no accessory muscle use, non-labored,  Neuro: normal tone, normal sensation to touch Psych:  normal insight, alert and oriented   Assessment and Plan:  Left lower quadrant abdominal pain Findings on CT were normal.  He may have a sports hernia that was not apparent on the CT scan.  His CT did show significant degenerative changes of the left hip joint. -Counseled on supportive care. -Follow-up as needed.  Follow Up Instructions:    I discussed the assessment and treatment plan with the patient. The patient was provided an opportunity to ask questions and all were answered. The patient agreed with the plan and demonstrated an understanding of the instructions.   The patient was advised to call back or seek an in-person evaluation if the symptoms worsen or if the condition fails to improve as anticipated.  I provided 5 minutes of non-face-to-face time during this encounter.   Clearance Coots, MD

## 2019-09-18 DIAGNOSIS — D485 Neoplasm of uncertain behavior of skin: Secondary | ICD-10-CM | POA: Diagnosis not present

## 2019-09-18 DIAGNOSIS — D225 Melanocytic nevi of trunk: Secondary | ICD-10-CM | POA: Diagnosis not present

## 2019-09-18 DIAGNOSIS — L570 Actinic keratosis: Secondary | ICD-10-CM | POA: Diagnosis not present

## 2019-10-02 ENCOUNTER — Encounter: Payer: Self-pay | Admitting: Family Medicine

## 2019-10-02 ENCOUNTER — Ambulatory Visit (INDEPENDENT_AMBULATORY_CARE_PROVIDER_SITE_OTHER): Payer: BC Managed Care – PPO | Admitting: Family Medicine

## 2019-10-02 ENCOUNTER — Other Ambulatory Visit: Payer: Self-pay

## 2019-10-02 VITALS — BP 118/75 | HR 85 | Ht 67.0 in | Wt 160.0 lb

## 2019-10-02 DIAGNOSIS — M1612 Unilateral primary osteoarthritis, left hip: Secondary | ICD-10-CM | POA: Diagnosis not present

## 2019-10-02 DIAGNOSIS — R1032 Left lower quadrant pain: Secondary | ICD-10-CM

## 2019-10-02 DIAGNOSIS — M23306 Other meniscus derangements, unspecified meniscus, right knee: Secondary | ICD-10-CM | POA: Diagnosis not present

## 2019-10-02 NOTE — Progress Notes (Signed)
[name] - 53 y.o. male MRN [id number]  Date of birth: 1966/06/01  SUBJECTIVE:  Including CC & ROS.  Chief Complaint  Patient presents with  . Follow-up    follow up for left hip/abdominal     Timothy Mcintosh is a 53 y.o. male that is following up for his abdominal fullness, left hip pain, and right knee pain.  He reports the fullness is still there but he denies any pain.  He feels that sometimes when his left hip is acting up the fullness seems to be larger.  CT scan of his abdomen was unrevealing for any hernia or infection.  His left hip x-ray has demonstrated severe left hip degenerative changes.  He feels significant improvement in his right knee since the injection.  There is minor pain anteriorly but the generalized stiffness has improved.  Denies any mechanical symptoms.   Review of Systems  Constitutional: Negative for fever.  HENT: Negative for congestion.   Respiratory: Negative for cough.   Cardiovascular: Negative for chest pain.  Gastrointestinal: Negative for abdominal pain.  Musculoskeletal: Positive for arthralgias.  Skin: Negative for color change.  Neurological: Negative for weakness.  Hematological: Negative for adenopathy.    HISTORY: Past Medical, Surgical, Social, and Family History Reviewed & Updated per EMR.   Pertinent Historical Findings include:  Past Medical History:  Diagnosis Date  . Family history of breast cancer   . Family history of genetic disease carrier    sister PALB2+  . Family history of lung cancer   . Family history of throat cancer   . Headache   . Hyperlipidemia   . Hypertension   . Rheumatoid arthritis ([place])   . Vision abnormalities     Past Surgical History:  Procedure Laterality Date  . BACK SURGERY Bilateral    c5 -c6  . CARPAL TUNNEL RELEASE Right     No Known Allergies  Family History  Problem Relation Age of Onset  . High Cholesterol Father   . Kidney Stones Father   . Heart failure Maternal Grandfather         Diet age 70 of "massive heart failure" unknown if MI or not  . Lung cancer Mother   . Breast cancer Mother 3  . Kidney cancer Mother   . Skin cancer Mother   . Breast cancer Sister 18       PALB2+  . Breast cancer Paternal Grandfather        54's  . Throat cancer Paternal Grandfather   . Other Sister        PALB2+  . Throat cancer Other   . Throat cancer Other      Social History   Socioeconomic History  . Marital status: Married    Spouse name: Not on file  . Number of children: Not on file  . Years of education: Not on file  . Highest education level: Not on file  Occupational History  . Not on file  Social Needs  . Financial resource strain: Not on file  . Food insecurity    Worry: Not on file    Inability: Not on file  . Transportation needs    Medical: Not on file    Non-medical: Not on file  Tobacco Use  . Smoking status: Current Every Day Smoker  . Smokeless tobacco: Never Used  . Tobacco comment: smoking since age 62  Substance and Sexual Activity  . Alcohol use: No    Alcohol/week: 0.0 standard drinks  .  Drug use: No  . Sexual activity: Not on file  Lifestyle  . Physical activity    Days per week: Not on file    Minutes per session: Not on file  . Stress: Not on file  Relationships  . Social Herbalist on phone: Not on file    Gets together: Not on file    Attends religious service: Not on file    Active member of club or organization: Not on file    Attends meetings of clubs or organizations: Not on file    Relationship status: Not on file  . Intimate partner violence    Fear of current or ex partner: Not on file    Emotionally abused: Not on file    Physically abused: Not on file    Forced sexual activity: Not on file  Other Topics Concern  . Not on file  Social History Narrative  . Not on file     PHYSICAL EXAM:  VS: BP 118/75   Pulse 85   Ht _0  (1.702 m)   Wt 160 lb (72.6 kg)   BMI 25.06 kg/m  Physical Exam  Gen: NAD, alert, cooperative with exam, well-appearing ENT: normal lips, normal nasal mucosa,  Eye: normal EOM, normal conjunctiva and lids CV:  no edema, +2 pedal pulses   Resp: no accessory muscle use, non-labored,  GI: no masses or tenderness, no hernia  Skin: no rashes, no areas of induration  Neuro: normal tone, normal sensation to touch Psych:  normal insight, alert and oriented MSK:  Left hip: Some limited internal rotation. Normal external rotation. No tenderness to palpation of the greater trochanter. Normal strength resistance with hip flexion. Right knee: No obvious effusion. No tenderness to palpation of the medial or lateral joint line. Normal range of motion. No instability. Negative McMurray's test. Neurovascularly intact      ASSESSMENT & PLAN:   Left lower quadrant abdominal pain CT was unrevealing for infection.  Still possible to have sports hernia that was not captured on CT.  Could be related to the left hip arthritis as he does experience some fullness in the area. -Monitor for now. -Counseled on supportive care. -Could consider MRI to evaluate for sports hernia.  Primary osteoarthritis of left hip Improved with the injection but pain is still ongoing.  Has improved with the injection.  Counseled on surgery if it comes to that. -Counseled on home exercise therapy and supportive care. -Can consider physical therapy. -Can repeat injection if needed in a few months.  Degenerative tear of meniscus of right knee Significant improvement with injection.  Pain has almost resolved. -Counseled on home exercise therapy and supportive care. -Could consider physical therapy

## 2019-10-02 NOTE — Assessment & Plan Note (Signed)
Improved with the injection but pain is still ongoing.  Has improved with the injection.  Counseled on surgery if it comes to that. -Counseled on home exercise therapy and supportive care. -Can consider physical therapy. -Can repeat injection if needed in a few months.

## 2019-10-02 NOTE — Assessment & Plan Note (Signed)
Significant improvement with injection.  Pain has almost resolved. -Counseled on home exercise therapy and supportive care. -Could consider physical therapy

## 2019-10-02 NOTE — Patient Instructions (Signed)
Good to see you Please keep up the exercises for the hip and the knee  Please use ice on the knee as needed  Let us know about the MRI   Please send me a message in MyChart with any questions or updates.  Please see [place] back as needed.   --Dr. Raeford Razor

## 2019-10-02 NOTE — Assessment & Plan Note (Signed)
CT was unrevealing for infection.  Still possible to have sports hernia that was not captured on CT.  Could be related to the left hip arthritis as he does experience some fullness in the area. -Monitor for now. -Counseled on supportive care. -Could consider MRI to evaluate for sports hernia.

## 2019-11-16 ENCOUNTER — Encounter: Payer: Self-pay | Admitting: Family Medicine

## 2019-11-20 ENCOUNTER — Other Ambulatory Visit: Payer: Self-pay | Admitting: Family Medicine

## 2019-11-20 DIAGNOSIS — M1612 Unilateral primary osteoarthritis, left hip: Secondary | ICD-10-CM

## 2019-11-27 ENCOUNTER — Other Ambulatory Visit: Payer: Self-pay | Admitting: Orthopedic Surgery

## 2019-12-10 NOTE — Patient Instructions (Signed)
DUE TO COVID-19 ONLY ONE VISITOR IS ALLOWED TO COME WITH YOU AND STAY IN THE WAITING ROOM ONLY DURING PRE OP AND PROCEDURE DAY OF SURGERY. THE 1 VISITOR MAY VISIT WITH YOU AFTER SURGERY IN YOUR PRIVATE ROOM DURING VISITING HOURS ONLY!  YOU NEED TO HAVE A COVID 19 TEST ON: 12/11/19 @ 11:50 am, THIS TEST MUST BE DONE BEFORE SURGERY, COME  [address], [place] , [address].  [facility]) ONCE YOUR COVID TEST IS COMPLETED, PLEASE BEGIN THE QUARANTINE INSTRUCTIONS AS OUTLINED IN YOUR HANDOUT.                Timothy Mcintosh     Your procedure is scheduled on: 12/14/19   Report to   [facility] Main  Entrance   Report to SHORT STAY at: 5:30 AM     Call this number if you have problems the morning of surgery [phone]    Remember:    BRUSH YOUR TEETH MORNING OF SURGERY AND RINSE YOUR MOUTH OUT, NO CHEWING GUM CANDY OR MINTS.     Take these medicines the morning of surgery with A SIP OF WATER: ALLOPURINOL,GABAPENTIN,OMEPRAZOLE,SIMVASTATIN.                                 You may not have any metal on your body including hair pins and              piercings  Do not wear jewelry,lotions, powders or perfumes, deodorant             Men may shave face and neck.   Do not bring valuables to the hospital. [place] IS NOT             RESPONSIBLE   FOR VALUABLES.  Contacts, dentures or bridgework may not be worn into surgery.  Leave suitcase in the car. After surgery it may be brought to your room.     Patients discharged the day of surgery will not be allowed to drive home. IF YOU ARE HAVING SURGERY AND GOING HOME THE SAME DAY, YOU MUST HAVE AN ADULT TO DRIVE YOU HOME AND BE WITH YOU FOR 24 HOURS. YOU MAY GO HOME BY TAXI OR UBER OR ORTHERWISE, BUT AN ADULT MUST ACCOMPANY YOU HOME AND STAY WITH YOU FOR 24 HOURS.  Name and phone number of your driver:  Special Instructions: N/A              Please read over the following fact sheets you were  given: _____________________________________________________________________             NO SOLID FOOD AFTER MIDNIGHT THE NIGHT PRIOR TO SURGERY. NOTHING BY MOUTH EXCEPT CLEAR LIQUIDS UNTIL: 4:30 AM . PLEASE FINISH ENSURE DRINK PER SURGEON ORDER  WHICH NEEDS TO BE COMPLETED AT: 4:30 AM .   CLEAR LIQUID DIET   Foods Allowed                                                                     Foods Excluded  Coffee and tea, regular and decaf  liquids that you cannot  Plain Jell-O any favor except red or purple                                           see through such as: Fruit ices (not with fruit pulp)                                     milk, soups, orange juice  Iced Popsicles                                    All solid food Carbonated beverages, regular and diet                                    Cranberry, grape and apple juices Sports drinks like Gatorade Lightly seasoned clear broth or consume(fat free) Sugar, honey syrup  Sample Menu Breakfast                                Lunch                                     Supper Cranberry juice                    Beef broth                            Chicken broth Jell-O                                     Grape juice                           Apple juice Coffee or tea                        Jell-O                                      Popsicle                                                Coffee or tea                        Coffee or tea  _____________________________________________________________________  [facility] - Preparing for Surgery Before surgery, you can play an important role.  Because skin is not sterile, your skin needs to be as free of germs as possible.  You can reduce the number of germs on your skin by washing with CHG (chlorahexidine gluconate) soap before surgery.  CHG is an antiseptic cleaner which kills  germs and bonds with the skin to continue killing germs even after  washing. Please DO NOT use if you have an allergy to CHG or antibacterial soaps.  If your skin becomes reddened/irritated stop using the CHG and inform your nurse when you arrive at Short Stay. Do not shave (including legs and underarms) for at least 48 hours prior to the first CHG shower.  You may shave your face/neck. Please follow these instructions carefully:  1.  Shower with CHG Soap the night before surgery and the  morning of Surgery.  2.  If you choose to wash your hair, wash your hair first as usual with your  normal  shampoo.  3.  After you shampoo, rinse your hair and body thoroughly to remove the  shampoo.                           4.  Use CHG as you would any other liquid soap.  You can apply chg directly  to the skin and wash                       Gently with a scrungie or clean washcloth.  5.  Apply the CHG Soap to your body ONLY FROM THE NECK DOWN.   Do not use on face/ open                           Wound or open sores. Avoid contact with eyes, ears mouth and genitals (private parts).                       Wash face,  Genitals (private parts) with your normal soap.             6.  Wash thoroughly, paying special attention to the area where your surgery  will be performed.  7.  Thoroughly rinse your body with warm water from the neck down.  8.  DO NOT shower/wash with your normal soap after using and rinsing off  the CHG Soap.                9.  Pat yourself dry with a clean towel.            10.  Wear clean pajamas.            11.  Place clean sheets on your bed the night of your first shower and do not  sleep with pets. Day of Surgery : Do not apply any lotions/deodorants the morning of surgery.  Please wear clean clothes to the hospital/surgery center.  FAILURE TO FOLLOW THESE INSTRUCTIONS MAY RESULT IN THE CANCELLATION OF YOUR SURGERY PATIENT SIGNATURE_________________________________  NURSE  SIGNATURE__________________________________  ________________________________________________________________________   Timothy Mcintosh  An incentive spirometer is a tool that can help keep your lungs clear and active. This tool measures how well you are filling your lungs with each breath. Taking long deep breaths may help reverse or decrease the chance of developing breathing (pulmonary) problems (especially infection) following:  A long period of time when you are unable to move or be active. BEFORE THE PROCEDURE   If the spirometer includes an indicator to show your best effort, your nurse or respiratory therapist will set it to a desired goal.  If possible, sit up straight or lean slightly forward. Try not to slouch.  Hold the incentive spirometer in an  upright position. INSTRUCTIONS FOR USE  1. Sit on the edge of your bed if possible, or sit up as far as you can in bed or on a chair. 2. Hold the incentive spirometer in an upright position. 3. Breathe out normally. 4. Place the mouthpiece in your mouth and seal your lips tightly around it. 5. Breathe in slowly and as deeply as possible, raising the piston or the ball toward the top of the column. 6. Hold your breath for 3-5 seconds or for as long as possible. Allow the piston or ball to fall to the bottom of the column. 7. Remove the mouthpiece from your mouth and breathe out normally. 8. Rest for a few seconds and repeat Steps 1 through 7 at least 10 times every 1-2 hours when you are awake. Take your time and take a few normal breaths between deep breaths. 9. The spirometer may include an indicator to show your best effort. Use the indicator as a goal to work toward during each repetition. 10. After each set of 10 deep breaths, practice coughing to be sure your lungs are clear. If you have an incision (the cut made at the time of surgery), support your incision when coughing by placing a pillow or rolled up towels firmly  against it. Once you are able to get out of bed, walk around indoors and cough well. You may stop using the incentive spirometer when instructed by your caregiver.  RISKS AND COMPLICATIONS  Take your time so you do not get dizzy or light-headed.  If you are in pain, you may need to take or ask for pain medication before doing incentive spirometry. It is harder to take a deep breath if you are having pain. AFTER USE  Rest and breathe slowly and easily.  It can be helpful to keep track of a log of your progress. Your caregiver can provide you with a simple table to help with this. If you are using the spirometer at home, follow these instructions: Timothy Mcintosh IF:   You are having difficultly using the spirometer.  You have trouble using the spirometer as often as instructed.  Your pain medication is not giving enough relief while using the spirometer.  You develop fever of 100.5 F (38.1 C) or higher. SEEK IMMEDIATE MEDICAL CARE IF:   You cough up bloody sputum that had not been present before.  You develop fever of 102 F (38.9 C) or greater.  You develop worsening pain at or near the incision site. MAKE SURE YOU:   Understand these instructions.  Will watch your condition.  Will get help right away if you are not doing well or get worse. Document Released: 02/28/2007 Document Revised: 01/10/2012 Document Reviewed: 05/01/2007 [facility] Patient Information 2014 [place], [place].   ________________________________________________________________________

## 2019-12-11 ENCOUNTER — Encounter (HOSPITAL_COMMUNITY)
Admission: RE | Admit: 2019-12-11 | Discharge: 2019-12-11 | Disposition: A | Discharge status: Home / Self Care | Payer: 59 | Source: Ambulatory Visit | Attending: Orthopedic Surgery | Admitting: Orthopedic Surgery

## 2019-12-11 ENCOUNTER — Other Ambulatory Visit (HOSPITAL_COMMUNITY)
Admission: RE | Admit: 2019-12-11 | Discharge: 2019-12-11 | Disposition: A | Discharge status: Home / Self Care | Payer: 59 | Source: Ambulatory Visit

## 2019-12-11 ENCOUNTER — Ambulatory Visit (HOSPITAL_COMMUNITY)
Admission: RE | Admit: 2019-12-11 | Discharge: 2019-12-11 | Disposition: A | Discharge status: Home / Self Care | Payer: 59 | Source: Ambulatory Visit | Attending: Orthopedic Surgery | Admitting: Orthopedic Surgery

## 2019-12-11 ENCOUNTER — Encounter (HOSPITAL_COMMUNITY): Payer: Self-pay

## 2019-12-11 ENCOUNTER — Other Ambulatory Visit: Payer: Self-pay

## 2019-12-11 DIAGNOSIS — Z01818 Encounter for other preprocedural examination: Secondary | ICD-10-CM | POA: Diagnosis not present

## 2019-12-11 DIAGNOSIS — Z01811 Encounter for preprocedural respiratory examination: Secondary | ICD-10-CM

## 2019-12-11 DIAGNOSIS — Z20822 Contact with and (suspected) exposure to covid-19: Secondary | ICD-10-CM | POA: Diagnosis not present

## 2019-12-11 LAB — URINALYSIS, ROUTINE W REFLEX MICROSCOPIC
Bilirubin Urine: NEGATIVE
Glucose, UA: NEGATIVE mg/dL
Hgb urine dipstick: NEGATIVE
Ketones, ur: NEGATIVE mg/dL
Leukocytes,Ua: NEGATIVE
Nitrite: NEGATIVE
Protein, ur: NEGATIVE mg/dL
Specific Gravity, Urine: 1.005 (ref 1.005–1.030)
pH: 8 (ref 5.0–8.0)

## 2019-12-11 LAB — CBC WITH DIFFERENTIAL/PLATELET
Abs Immature Granulocytes: 0.03 10*3/uL (ref 0.00–0.07)
Basophils Absolute: 0.1 10*3/uL (ref 0.0–0.1)
Basophils Relative: 1 %
Eosinophils Absolute: 0.1 10*3/uL (ref 0.0–0.5)
Eosinophils Relative: 1 %
HCT: 41.9 % (ref 39.0–52.0)
Hemoglobin: 13.9 g/dL (ref 13.0–17.0)
Immature Granulocytes: 0 %
Lymphocytes Relative: 26 %
Lymphs Abs: 2.3 10*3/uL (ref 0.7–4.0)
MCH: 29.9 pg (ref 26.0–34.0)
MCHC: 33.2 g/dL (ref 30.0–36.0)
MCV: 90.1 fL (ref 80.0–100.0)
Monocytes Absolute: 0.4 10*3/uL (ref 0.1–1.0)
Monocytes Relative: 5 %
Neutro Abs: 5.9 10*3/uL (ref 1.7–7.7)
Neutrophils Relative %: 67 %
Platelets: 273 10*3/uL (ref 150–400)
RBC: 4.65 MIL/uL (ref 4.22–5.81)
RDW: 13.2 % (ref 11.5–15.5)
WBC: 8.8 10*3/uL (ref 4.0–10.5)
nRBC: 0 % (ref 0.0–0.2)

## 2019-12-11 LAB — COMPREHENSIVE METABOLIC PANEL
ALT: 15 U/L (ref 0–44)
AST: 20 U/L (ref 15–41)
Albumin: 4.6 g/dL (ref 3.5–5.0)
Alkaline Phosphatase: 102 U/L (ref 38–126)
Anion gap: 10 (ref 5–15)
BUN: 12 mg/dL (ref 6–20)
CO2: 23 mmol/L (ref 22–32)
Calcium: 9.3 mg/dL (ref 8.9–10.3)
Chloride: 107 mmol/L (ref 98–111)
Creatinine, Ser: 0.86 mg/dL (ref 0.61–1.24)
GFR calc Af Amer: >60 mL/min (ref >60)
GFR calc non Af Amer: >60 mL/min (ref >60)
Glucose, Bld: 106 mg/dL — ABNORMAL HIGH (ref 70–99)
Potassium: 3.8 mmol/L (ref 3.5–5.1)
Sodium: 140 mmol/L (ref 135–145)
Total Bilirubin: 0.4 mg/dL (ref 0.3–1.2)
Total Protein: 7.3 g/dL (ref 6.5–8.1)

## 2019-12-11 LAB — SURGICAL PCR SCREEN
MRSA, PCR: NEGATIVE
Staphylococcus aureus: NEGATIVE

## 2019-12-11 LAB — PROTIME-INR
INR: 0.9 (ref 0.8–1.2)
Prothrombin Time: 12 seconds (ref 11.4–15.2)

## 2019-12-11 LAB — APTT: aPTT: 28 seconds (ref 24–36)

## 2019-12-11 LAB — SARS CORONAVIRUS 2 (TAT 6-24 HRS): SARS Coronavirus 2: NEGATIVE

## 2019-12-11 NOTE — Progress Notes (Signed)
PCP - Esperanza Richters PA-C. LOV: 08/28/19. EPIC Cardiologist -   Chest x-ray -  EKG -  Stress Test -  ECHO -  Cardiac Cath -   Sleep Study -  CPAP -   Fasting Blood Sugar -  Checks Blood Sugar _____ times a day  Blood Thinner Instructions: Aspirin Instructions: Last Dose:  Anesthesia review: Pt. Was advised by RN to stop smoking at least 24 hours before his surgery,pt. Verbalized his understanding of the subject.  Patient denies shortness of breath, fever, cough and chest pain at PAT appointment   Patient verbalized understanding of instructions that were given to them at the PAT appointment. Patient was also instructed that they will need to review over the PAT instructions again at home before surgery.

## 2019-12-12 ENCOUNTER — Other Ambulatory Visit: Payer: Self-pay | Admitting: Family Medicine

## 2019-12-12 DIAGNOSIS — K219 Gastro-esophageal reflux disease without esophagitis: Secondary | ICD-10-CM

## 2019-12-12 LAB — ABO/RH: ABO/RH(D): O POS

## 2019-12-13 MED ORDER — BUPIVACAINE LIPOSOME 1.3 % IJ SUSP
10.0000 mL | INTRAMUSCULAR | Status: DC
  Filled 2019-12-13: qty 10

## 2019-12-13 NOTE — Anesthesia Preprocedure Evaluation (Addendum)
Anesthesia Evaluation  Patient identified by MRN, date of birth, ID band Patient awake    Reviewed: Allergy & Precautions, NPO status , Patient's Chart, lab work & pertinent test results  History of Anesthesia Complications Negative for: history of anesthetic complications  Airway Mallampati: II  TM Distance: >3 FB Neck ROM: Full    Dental no notable dental hx.    Pulmonary asthma , Current Smoker and Patient abstained from smoking.,    Pulmonary exam normal        Cardiovascular hypertension, Pt. on medications Normal cardiovascular exam     Neuro/Psych Anxiety Depression Dementia negative neurological ROS     GI/Hepatic Neg liver ROS, GERD  Medicated and Controlled,  Endo/Other  negative endocrine ROS  Renal/GU negative Renal ROS  negative genitourinary   Musculoskeletal  (+) Arthritis , Rheumatoid disorders,    Abdominal   Peds  Hematology negative hematology ROS (+)   Anesthesia Other Findings Day of surgery medications reviewed with patient.  Reproductive/Obstetrics negative OB ROS                            Anesthesia Physical Anesthesia Plan  ASA: II  Anesthesia Plan: Spinal   Post-op Pain Management:    Induction:   PONV Risk Score and Plan: 1 and Treatment may vary due to age or medical condition, Ondansetron, Dexamethasone, Propofol infusion and Midazolam  Airway Management Planned: Natural Airway and Simple Face Mask  Additional Equipment: None  Intra-op Plan:   Post-operative Plan:   Informed Consent: I have reviewed the patients History and Physical, chart, labs and discussed the procedure including the risks, benefits and alternatives for the proposed anesthesia with the patient or authorized representative who has indicated his/her understanding and acceptance.     Dental advisory given  Plan Discussed with: CRNA  Anesthesia Plan Comments:         Anesthesia Quick Evaluation

## 2019-12-13 NOTE — H&P (Signed)
TOTAL HIP ADMISSION H&P  Patient is admitted for left total hip arthroplasty.  Subjective:  Chief Complaint: left hip pain  HPI: Timothy Mcintosh, 54 y.o. male, has a history of pain and functional disability in the left hip(s) due to arthritis and patient has failed non-surgical conservative treatments for greater than 12 weeks to include NSAID's and/or analgesics, corticosteriod injections, use of assistive devices, weight reduction as appropriate and activity modification.  Onset of symptoms was gradual starting 2 years ago with gradually worsening course since that time.The patient noted no past surgery on the left hip(s).  Patient currently rates pain in the left hip at 9 out of 10 with activity. Patient has night pain, worsening of pain with activity and weight bearing, trendelenberg gait, pain that interfers with activities of daily living and pain with passive range of motion. Patient has evidence of periarticular osteophytes, joint subluxation and joint space narrowing by imaging studies. This condition presents safety issues increasing the risk of falls. This patient has had failure of all reasonable conservative care.  There is no current active infection.  Patient Active Problem List   Diagnosis Date Noted  . Degenerative tear of meniscus of right knee 09/04/2019  . Primary osteoarthritis of left hip 09/04/2019  . Left lower quadrant abdominal pain 09/04/2019  . Monoallelic mutation of PALB2 gene 05/31/2018  . Genetic testing 05/31/2018  . Hypertension 05/16/2018  . Left elbow pain 05/16/2018  . Family history of genetic disease carrier   . Family history of breast cancer   . Family history of lung cancer   . Family history of throat cancer   . Post concussion syndrome 09/15/2017  . Restless leg syndrome 09/15/2017  . Rheumatoid arthritis ([place]) 04/01/2016  . Dyslipidemia 12/25/2015  . Tobacco use disorder 12/25/2015  . Insomnia 12/25/2015  . Primary osteoarthritis of left knee  07/30/2015  . GERD (gastroesophageal reflux disease) 03/04/2014  . Major depression 03/04/2014  . Asthmatic bronchitis 09/19/2013  . ED (erectile dysfunction) 04/19/2013  . Dorsalgia 11/21/2011  . Hyperlipidemia 11/21/2011  . Gout 11/21/2011  . GAD (generalized anxiety disorder) 11/21/2011   Past Medical History:  Diagnosis Date  . Family history of breast cancer   . Family history of genetic disease carrier    sister PALB2+  . Family history of lung cancer   . Family history of throat cancer   . Headache   . Hyperlipidemia   . Hypertension   . Rheumatoid arthritis ([place])   . Vision abnormalities     Past Surgical History:  Procedure Laterality Date  . BACK SURGERY Bilateral    c5 -c6  . CARPAL TUNNEL RELEASE Right   . KNEE SURGERY Bilateral     Current Facility-Administered Medications  Medication Dose Route Frequency Provider Last Rate Last Admin  . [START ON 12/14/2019] bupivacaine liposome (EXPAREL) 1.3 % injection 133 mg  10 mL Other On Call to OR Dorna Leitz, MD       Current Outpatient Medications  Medication Sig Dispense Refill Last Dose  . allopurinol (ZYLOPRIM) 300 MG tablet Take 1 tablet (300 mg total) by mouth daily. 90 tablet 4   . gabapentin (NEURONTIN) 300 MG capsule Take one in the evening and 1 or 2 at bedtime (Patient taking differently: Take 300 mg by mouth at bedtime. ) 90 capsule 11   . lisinopril (ZESTRIL) 20 MG tablet TAKE 1 AND 1/2 TABLET BY MOUTH DAILY FOR BLOOD PRESSURE, MAY INCREASE TO 2 TABLETS BY MOUTH DAILY IF NEEDED (  Patient taking differently: Take 20 mg by mouth daily. ) 180 tablet 1   . Multiple Vitamin (MULTIVITAMIN) capsule Take 1 capsule by mouth daily.      . simvastatin (ZOCOR) 40 MG tablet TAKE ONE TABLET BY MOUTH DAILY (Patient taking differently: Take 40 mg by mouth daily. ) 90 tablet 1   . SUBOXONE 8-2 MG FILM Place 1 Film under the tongue 2 (two) times daily.   0   . traZODone (DESYREL) 100 MG tablet TAKE ONE TABLET BY MOUTH  AT  BEDTIME AS NEEDED SLEEP (Patient taking differently: Take 100 mg by mouth at bedtime. ) 90 tablet 2   . Diclofenac Sodium (PENNSAID) 2 % SOLN Place 1 application onto the skin 2 (two) times daily. (Patient not taking: Reported on 11/30/2019) 112 g 3 Not Taking at Unknown time  . omeprazole (PRILOSEC) 40 MG capsule Take 1 capsule (40 mg total) by mouth daily. 90 capsule 1   . predniSONE (DELTASONE) 10 MG tablet 6 tab po day 1 5 tab po day 2 4 tab po day 3 3 tab po day 4 2 tab po day 5 1 tab po day 6 (Patient not taking: Reported on 11/30/2019) 21 tablet 0 Not Taking at Unknown time  . sildenafil (REVATIO) 20 MG tablet TAKE 1 TO 5 TABLETS BY MOUTH DAILY AS NEEDED FOR FOR ERECTILE DYSFUNCTION (Patient not taking: Reported on 11/30/2019) 50 tablet 3 Not Taking at Unknown time   No Known Allergies  Social History   Tobacco Use  . Smoking status: Current Every Day Smoker  . Smokeless tobacco: Never Used  . Tobacco comment: smoking since age 70  Substance Use Topics  . Alcohol use: No    Alcohol/week: 0.0 standard drinks    Family History  Problem Relation Age of Onset  . High Cholesterol Father   . Kidney Stones Father   . Heart failure Maternal Grandfather        Diet age 20 of "massive heart failure" unknown if MI or not  . Lung cancer Mother   . Breast cancer Mother 51  . Kidney cancer Mother   . Skin cancer Mother   . Breast cancer Sister 23       PALB2+  . Breast cancer Paternal Grandfather        56's  . Throat cancer Paternal Grandfather   . Other Sister        PALB2+  . Throat cancer Other   . Throat cancer Other      Review of Systems ROS: I have reviewed the patient's review of systems thoroughly and there are no positive responses as relates to the HPI. Objective:  Physical Exam  Vital signs in last 24 hours:   Well-developed well-nourished patient in no acute distress. Alert and oriented x3 HEENT:within normal limits Cardiac: Regular rate and  rhythm Pulmonary: Lungs clear to auscultation Abdomen: Soft and nontender.  Normal active bowel sounds  Musculoskeletal: (left hip: Pain to range of motion.  Limited range of motion.  Pain with internal rotation. Labs: Recent Results (from the past 2160 hour(s))  ABO/Rh     Status: None   Collection Time: 12/11/19 11:39 AM  Result Value Ref Range   ABO/RH(D)      O POS Performed at [facility], [place] [address] [place], [place] [address]   APTT     Status: None   Collection Time: 12/11/19 11:42 AM  Result Value Ref Range   aPTT 28 24 -  36 seconds    Comment: Performed at [facility], [place], [place] [address], [place], [place] [address]  CBC WITH DIFFERENTIAL     Status: None   Collection Time: 12/11/19 11:42 AM  Result Value Ref Range   WBC 8.8 4.0 - 10.5 K/uL   RBC 4.65 4.22 - 5.81 MIL/uL   Hemoglobin 13.9 13.0 - 17.0 g/dL   HCT 41.9 39.0 - 52.0 %   MCV 90.1 80.0 - 100.0 fL   MCH 29.9 26.0 - 34.0 pg   MCHC 33.2 30.0 - 36.0 g/dL   RDW 13.2 11.5 - 15.5 %   Platelets 273 150 - 400 K/uL   nRBC 0.0 0.0 - 0.2 %   Neutrophils Relative % 67 %   Neutro Abs 5.9 1.7 - 7.7 K/uL   Lymphocytes Relative 26 %   Lymphs Abs 2.3 0.7 - 4.0 K/uL   Monocytes Relative 5 %   Monocytes Absolute 0.4 0.1 - 1.0 K/uL   Eosinophils Relative 1 %   Eosinophils Absolute 0.1 0.0 - 0.5 K/uL   Basophils Relative 1 %   Basophils Absolute 0.1 0.0 - 0.1 K/uL   Immature Granulocytes 0 %   Abs Immature Granulocytes 0.03 0.00 - 0.07 K/uL    Comment: Performed at [facility], [place] [address], [place], [place] [address]  Comprehensive metabolic panel     Status: Abnormal   Collection Time: 12/11/19 11:42 AM  Result Value Ref Range   Sodium 140 135 - 145 mmol/L   Potassium 3.8 3.5 - 5.1 mmol/L   Chloride 107 98 - 111 mmol/L   CO2 23 22 - 32 mmol/L   Glucose, Bld 106 (H) 70 - 99 mg/dL   BUN 12 6 - 20 mg/dL   Creatinine, Ser 0.86 0.61 - 1.24 mg/dL   Calcium  9.3 8.9 - 10.3 mg/dL   Total Protein 7.3 6.5 - 8.1 g/dL   Albumin 4.6 3.5 - 5.0 g/dL   AST 20 15 - 41 U/L   ALT 15 0 - 44 U/L   Alkaline Phosphatase 102 38 - 126 U/L   Total Bilirubin 0.4 0.3 - 1.2 mg/dL   GFR calc non Af Amer >60 >60 mL/min   GFR calc Af Amer >60 >60 mL/min   Anion gap 10 5 - 15    Comment: Performed at [facility], [place] [address] [place], [place] [address]  Protime-INR     Status: None   Collection Time: 12/11/19 11:42 AM  Result Value Ref Range   Prothrombin Time 12.0 11.4 - 15.2 seconds   INR 0.9 0.8 - 1.2    Comment: (NOTE) INR goal varies based on device and disease states. Performed at [facility], [place] [address], [place], [place] [address]   Type and screen Order type and screen if day of surgery is less than 15 days from draw of preadmission visit or order morning of surgery if day of surgery is greater than 6 days from preadmission visit.     Status: None   Collection Time: 12/11/19 11:42 AM  Result Value Ref Range   ABO/RH(D) O POS    Antibody Screen NEG    Sample Expiration 12/25/2019,2359    Extend sample reason      NO TRANSFUSIONS OR PREGNANCY IN THE PAST 3 MONTHS Performed at [facility], [place] [address], [place], [place] [address]   Urinalysis, Routine w reflex microscopic     Status: Abnormal  Collection Time: 12/11/19 11:42 AM  Result Value Ref Range   Color, Urine STRAW (A) YELLOW   APPearance CLEAR CLEAR   Specific Gravity, Urine 1.005 1.005 - 1.030   pH 8.0 5.0 - 8.0   Glucose, UA NEGATIVE NEGATIVE mg/dL   Hgb urine dipstick NEGATIVE NEGATIVE   Bilirubin Urine NEGATIVE NEGATIVE   Ketones, ur NEGATIVE NEGATIVE mg/dL   Protein, ur NEGATIVE NEGATIVE mg/dL   Nitrite NEGATIVE NEGATIVE   Leukocytes,Ua NEGATIVE NEGATIVE    Comment: Performed at [place] [address] [place], [place] [address]  Surgical pcr screen     Status: None   Collection Time:  12/11/19 11:42 AM   Specimen: Vein; Nasal Swab  Result Value Ref Range   MRSA, PCR NEGATIVE NEGATIVE   Staphylococcus aureus NEGATIVE NEGATIVE    Comment: (NOTE) The Xpert SA Assay (FDA approved for NASAL specimens in patients 72 years of age and older), is one component of a comprehensive surveillance program. It is not intended to diagnose infection nor to guide or monitor treatment. Performed at [facility], [place] [address] [place], [place] [address]   SARS CORONAVIRUS 2 (TAT 6-24 HRS) Nasopharyngeal Nasopharyngeal Swab     Status: None   Collection Time: 12/11/19 12:20 PM   Specimen: Nasopharyngeal Swab  Result Value Ref Range   SARS Coronavirus 2 NEGATIVE NEGATIVE    Comment: (NOTE) SARS-CoV-2 target nucleic acids are NOT DETECTED. The SARS-CoV-2 RNA is generally detectable in upper and lower respiratory specimens during the acute phase of infection. Negative results do not preclude SARS-CoV-2 infection, do not rule out co-infections with other pathogens, and should not be used as the sole basis for treatment or other patient management decisions. Negative results must be combined with clinical observations, patient history, and epidemiological information. The expected result is Negative. Fact Sheet for Patients: SugarRoll.be Fact Sheet for Healthcare Providers: https://www.woods-mathews.com/ This test is not yet approved or cleared by the [place] FDA and  has been authorized for detection and/or diagnosis of SARS-CoV-2 by FDA under an Emergency Use Authorization (EUA). This EUA will remain  in effect (meaning this test can be used) for the duration of the COVID-19 declaration under Section 56 4(b)(1) of the Act, 21 U.S.C. section 360bbb-3(b)(1), unless the authorization is terminated or revoked sooner. Performed at [facility], [place] [address] [place], [place] [address]     Estimated body mass  index is 25.73 kg/m as calculated from the following:   Height as of 12/11/19: '5\' 7"'$  (1.702 m).   Weight as of 12/11/19: 74.5 kg.   Imaging Review Plain radiographs demonstrate severe degenerative joint disease of the left hip(s). The bone quality appears to be fair for age and reported activity level.      Assessment/Plan:  End stage arthritis, left hip(s)  The patient history, physical examination, clinical judgement of the provider and imaging studies are consistent with end stage degenerative joint disease of the left hip(s) and total hip arthroplasty is deemed medically necessary. The treatment options including medical management, injection therapy, arthroscopy and arthroplasty were discussed at length. The risks and benefits of total hip arthroplasty were presented and reviewed. The risks due to aseptic loosening, infection, stiffness, dislocation/subluxation,  thromboembolic complications and other imponderables were discussed.  The patient acknowledged the explanation, agreed to proceed with the plan and consent was signed. Patient is being admitted for inpatient treatment for surgery, pain control, PT, OT, prophylactic antibiotics, VTE prophylaxis, progressive ambulation and  ADL's and discharge planning.The patient is planning to be discharged home with home health services

## 2019-12-14 ENCOUNTER — Encounter (HOSPITAL_COMMUNITY)
Admission: RE | Disposition: A | Discharge status: Home Health | Payer: Self-pay | Source: Other Acute Inpatient Hospital | Attending: Orthopedic Surgery

## 2019-12-14 ENCOUNTER — Other Ambulatory Visit: Payer: Self-pay

## 2019-12-14 ENCOUNTER — Ambulatory Visit (HOSPITAL_COMMUNITY): Payer: 59 | Admitting: Anesthesiology

## 2019-12-14 ENCOUNTER — Encounter (HOSPITAL_COMMUNITY): Payer: Self-pay | Admitting: Orthopedic Surgery

## 2019-12-14 ENCOUNTER — Observation Stay (HOSPITAL_COMMUNITY)
Admission: RE | Admit: 2019-12-14 | Discharge: 2019-12-15 | Disposition: A | Discharge status: Home Health | Payer: 59 | Source: Other Acute Inpatient Hospital | Attending: Orthopedic Surgery | Admitting: Orthopedic Surgery

## 2019-12-14 ENCOUNTER — Ambulatory Visit (HOSPITAL_COMMUNITY): Payer: 59 | Admitting: Physician Assistant

## 2019-12-14 ENCOUNTER — Ambulatory Visit (HOSPITAL_COMMUNITY): Payer: 59

## 2019-12-14 DIAGNOSIS — M1612 Unilateral primary osteoarthritis, left hip: Principal | ICD-10-CM | POA: Diagnosis present

## 2019-12-14 DIAGNOSIS — Z96642 Presence of left artificial hip joint: Secondary | ICD-10-CM

## 2019-12-14 DIAGNOSIS — M109 Gout, unspecified: Secondary | ICD-10-CM | POA: Diagnosis not present

## 2019-12-14 DIAGNOSIS — E785 Hyperlipidemia, unspecified: Secondary | ICD-10-CM | POA: Insufficient documentation

## 2019-12-14 DIAGNOSIS — Z79891 Long term (current) use of opiate analgesic: Secondary | ICD-10-CM | POA: Insufficient documentation

## 2019-12-14 DIAGNOSIS — F0390 Unspecified dementia without behavioral disturbance: Secondary | ICD-10-CM | POA: Insufficient documentation

## 2019-12-14 DIAGNOSIS — K219 Gastro-esophageal reflux disease without esophagitis: Secondary | ICD-10-CM | POA: Diagnosis not present

## 2019-12-14 DIAGNOSIS — Z79899 Other long term (current) drug therapy: Secondary | ICD-10-CM | POA: Insufficient documentation

## 2019-12-14 DIAGNOSIS — I10 Essential (primary) hypertension: Secondary | ICD-10-CM | POA: Diagnosis not present

## 2019-12-14 DIAGNOSIS — Z419 Encounter for procedure for purposes other than remedying health state, unspecified: Secondary | ICD-10-CM

## 2019-12-14 DIAGNOSIS — F17200 Nicotine dependence, unspecified, uncomplicated: Secondary | ICD-10-CM | POA: Insufficient documentation

## 2019-12-14 DIAGNOSIS — G4700 Insomnia, unspecified: Secondary | ICD-10-CM | POA: Insufficient documentation

## 2019-12-14 DIAGNOSIS — M25552 Pain in left hip: Secondary | ICD-10-CM | POA: Diagnosis present

## 2019-12-14 HISTORY — DX: Presence of left artificial hip joint: Z96.642

## 2019-12-14 HISTORY — PX: TOTAL HIP ARTHROPLASTY: SHX124

## 2019-12-14 LAB — TYPE AND SCREEN
ABO/RH(D): O POS
Antibody Screen: NEGATIVE

## 2019-12-14 SURGERY — ARTHROPLASTY, HIP, TOTAL, ANTERIOR APPROACH
Anesthesia: Spinal | Site: Hip | Laterality: Left

## 2019-12-14 MED ORDER — MIDAZOLAM HCL 2 MG/2ML IJ SOLN
INTRAMUSCULAR | Status: AC
  Filled 2019-12-14: qty 2

## 2019-12-14 MED ORDER — OXYCODONE HCL 5 MG/5ML PO SOLN: 5.0000 mg | Freq: Once | ORAL | Status: AC | PRN

## 2019-12-14 MED ORDER — ONDANSETRON HCL 4 MG PO TABS: 4.0000 mg | ORAL_TABLET | Freq: Four times a day (QID) | ORAL | Status: DC | PRN

## 2019-12-14 MED ORDER — PROPOFOL 10 MG/ML IV BOLUS
INTRAVENOUS | Status: AC
  Filled 2019-12-14: qty 20

## 2019-12-14 MED ORDER — HYDROMORPHONE HCL 1 MG/ML IJ SOLN
INTRAMUSCULAR | Status: AC
  Administered 2019-12-14: 0.5 mg via INTRAVENOUS
  Filled 2019-12-14: qty 2

## 2019-12-14 MED ORDER — CEFAZOLIN SODIUM-DEXTROSE 2-4 GM/100ML-% IV SOLN
2.0000 g | INTRAVENOUS | Status: AC
  Administered 2019-12-14: 2 g via INTRAVENOUS
  Filled 2019-12-14: qty 100

## 2019-12-14 MED ORDER — WATER FOR IRRIGATION, STERILE IR SOLN
Status: DC | PRN
  Administered 2019-12-14: 2000 mL

## 2019-12-14 MED ORDER — METHOCARBAMOL 500 MG IVPB - SIMPLE MED
INTRAVENOUS | Status: AC
  Filled 2019-12-14: qty 50

## 2019-12-14 MED ORDER — GABAPENTIN 300 MG PO CAPS: 300.0000 mg | ORAL_CAPSULE | Freq: Every day | ORAL | Status: DC

## 2019-12-14 MED ORDER — LACTATED RINGERS IV BOLUS: 250.0000 mL | Freq: Once | INTRAVENOUS | Status: DC

## 2019-12-14 MED ORDER — PROPOFOL 500 MG/50ML IV EMUL
INTRAVENOUS | Status: DC | PRN
  Administered 2019-12-14: 75 ug/kg/min via INTRAVENOUS

## 2019-12-14 MED ORDER — TRANEXAMIC ACID-NACL 1000-0.7 MG/100ML-% IV SOLN: 1000.0000 mg | Freq: Once | INTRAVENOUS | Status: DC

## 2019-12-14 MED ORDER — DIPHENHYDRAMINE HCL 12.5 MG/5ML PO ELIX: 12.5000 mg | ORAL_SOLUTION | Freq: Four times a day (QID) | ORAL | Status: DC | PRN

## 2019-12-14 MED ORDER — SODIUM CHLORIDE 0.9 % IV SOLN: INTRAVENOUS | Status: DC

## 2019-12-14 MED ORDER — LIDOCAINE 2% (20 MG/ML) 5 ML SYRINGE
INTRAMUSCULAR | Status: DC | PRN
  Administered 2019-12-14: 20 mg via INTRAVENOUS
  Administered 2019-12-14: 80 mg via INTRAVENOUS

## 2019-12-14 MED ORDER — DEXAMETHASONE SODIUM PHOSPHATE 10 MG/ML IJ SOLN
10.0000 mg | Freq: Two times a day (BID) | INTRAMUSCULAR | Status: AC
  Administered 2019-12-14: 10 mg via INTRAVENOUS
  Filled 2019-12-14: qty 1

## 2019-12-14 MED ORDER — BUPIVACAINE HCL (PF) 0.25 % IJ SOLN
INTRAMUSCULAR | Status: DC | PRN
  Administered 2019-12-14: 30 mL

## 2019-12-14 MED ORDER — MEPIVACAINE HCL (PF) 2 % IJ SOLN
INTRAMUSCULAR | Status: DC | PRN
  Administered 2019-12-14: 3.2 mL via EPIDURAL

## 2019-12-14 MED ORDER — KETAMINE HCL 10 MG/ML IJ SOLN
INTRAMUSCULAR | Status: DC | PRN
  Administered 2019-12-14 (×3): 10 mg via INTRAVENOUS

## 2019-12-14 MED ORDER — HYDROMORPHONE HCL 1 MG/ML IJ SOLN
1.0000 mg | INTRAMUSCULAR | Status: DC | PRN
  Administered 2019-12-14: 1 mg via INTRAVENOUS

## 2019-12-14 MED ORDER — CEFAZOLIN SODIUM-DEXTROSE 2-4 GM/100ML-% IV SOLN
2.0000 g | Freq: Four times a day (QID) | INTRAVENOUS | Status: AC
  Administered 2019-12-14 (×2): 2 g via INTRAVENOUS
  Filled 2019-12-14 (×3): qty 100

## 2019-12-14 MED ORDER — ASPIRIN EC 325 MG PO TBEC: 325.0000 mg | DELAYED_RELEASE_TABLET | Freq: Two times a day (BID) | ORAL | 0 refills | Status: DC

## 2019-12-14 MED ORDER — DIPHENHYDRAMINE HCL 12.5 MG/5ML PO ELIX: 12.5000 mg | ORAL_SOLUTION | ORAL | Status: DC | PRN

## 2019-12-14 MED ORDER — PHENYLEPHRINE HCL-NACL 10-0.9 MG/250ML-% IV SOLN
INTRAVENOUS | Status: DC | PRN
  Administered 2019-12-14: 25 ug/min via INTRAVENOUS

## 2019-12-14 MED ORDER — SODIUM CHLORIDE 0.9% FLUSH: 9.0000 mL | INTRAVENOUS | Status: DC | PRN

## 2019-12-14 MED ORDER — DIPHENHYDRAMINE HCL 50 MG/ML IJ SOLN: 12.5000 mg | Freq: Four times a day (QID) | INTRAMUSCULAR | Status: DC | PRN

## 2019-12-14 MED ORDER — HYDROMORPHONE HCL 1 MG/ML IJ SOLN
INTRAMUSCULAR | Status: AC
  Filled 2019-12-14: qty 2

## 2019-12-14 MED ORDER — BUPIVACAINE LIPOSOME 1.3 % IJ SUSP
INTRAMUSCULAR | Status: DC | PRN
  Administered 2019-12-14: 10 mL

## 2019-12-14 MED ORDER — METHOCARBAMOL 500 MG PO TABS
500.0000 mg | ORAL_TABLET | Freq: Four times a day (QID) | ORAL | Status: DC | PRN
  Administered 2019-12-14: 500 mg via ORAL
  Filled 2019-12-14: qty 1

## 2019-12-14 MED ORDER — MAGNESIUM CITRATE PO SOLN: 1.0000 | Freq: Once | ORAL | Status: DC | PRN

## 2019-12-14 MED ORDER — TRANEXAMIC ACID-NACL 1000-0.7 MG/100ML-% IV SOLN
1000.0000 mg | INTRAVENOUS | Status: AC
  Administered 2019-12-14: 1000 mg via INTRAVENOUS
  Filled 2019-12-14: qty 100

## 2019-12-14 MED ORDER — PANTOPRAZOLE SODIUM 40 MG PO TBEC
40.0000 mg | DELAYED_RELEASE_TABLET | Freq: Every day | ORAL | Status: DC
  Administered 2019-12-15: 40 mg via ORAL
  Filled 2019-12-14: qty 1

## 2019-12-14 MED ORDER — OXYCODONE HCL 5 MG PO TABS
ORAL_TABLET | ORAL | Status: AC
  Filled 2019-12-14: qty 1

## 2019-12-14 MED ORDER — HYDROMORPHONE HCL 1 MG/ML IJ SOLN: 0.5000 mg | INTRAMUSCULAR | Status: DC | PRN

## 2019-12-14 MED ORDER — METHOCARBAMOL 500 MG IVPB - SIMPLE MED
500.0000 mg | Freq: Four times a day (QID) | INTRAVENOUS | Status: DC | PRN
  Administered 2019-12-14: 500 mg via INTRAVENOUS
  Filled 2019-12-14: qty 50

## 2019-12-14 MED ORDER — BUPRENORPHINE HCL-NALOXONE HCL 8-2 MG SL FILM
1.0000 | ORAL_FILM | Freq: Two times a day (BID) | SUBLINGUAL | Status: DC
  Administered 2019-12-14: 1 via SUBLINGUAL
  Filled 2019-12-14 (×2): qty 1

## 2019-12-14 MED ORDER — ONDANSETRON HCL 4 MG/2ML IJ SOLN
INTRAMUSCULAR | Status: DC | PRN
  Administered 2019-12-14: 4 mg via INTRAVENOUS

## 2019-12-14 MED ORDER — DEXAMETHASONE SODIUM PHOSPHATE 10 MG/ML IJ SOLN
INTRAMUSCULAR | Status: DC | PRN
  Administered 2019-12-14: 10 mg via INTRAVENOUS

## 2019-12-14 MED ORDER — MIDAZOLAM HCL 2 MG/2ML IJ SOLN
INTRAMUSCULAR | Status: DC | PRN
  Administered 2019-12-14: 1 mg via INTRAVENOUS
  Administered 2019-12-14 (×2): .5 mg via INTRAVENOUS
  Administered 2019-12-14: 1 mg via INTRAVENOUS

## 2019-12-14 MED ORDER — ACETAMINOPHEN 500 MG PO TABS
1000.0000 mg | ORAL_TABLET | Freq: Once | ORAL | Status: AC
  Administered 2019-12-14: 1000 mg via ORAL
  Filled 2019-12-14: qty 2

## 2019-12-14 MED ORDER — HYDROMORPHONE HCL 1 MG/ML IJ SOLN
INTRAMUSCULAR | Status: AC
  Administered 2019-12-14: 1 mg via INTRAVENOUS
  Filled 2019-12-14: qty 2

## 2019-12-14 MED ORDER — LACTATED RINGERS IV SOLN: INTRAVENOUS | Status: DC

## 2019-12-14 MED ORDER — GABAPENTIN 300 MG PO CAPS
300.0000 mg | ORAL_CAPSULE | Freq: Two times a day (BID) | ORAL | Status: DC
  Administered 2019-12-14 – 2019-12-15 (×2): 300 mg via ORAL
  Filled 2019-12-14 (×2): qty 1

## 2019-12-14 MED ORDER — OXYCODONE HCL 5 MG PO TABS
5.0000 mg | ORAL_TABLET | ORAL | Status: DC | PRN
  Administered 2019-12-14: 10 mg via ORAL
  Administered 2019-12-14: 5 mg via ORAL
  Filled 2019-12-14: qty 2

## 2019-12-14 MED ORDER — HYDROMORPHONE 1 MG/ML IV SOLN
INTRAVENOUS | Status: DC
  Administered 2019-12-14: 30 mg via INTRAVENOUS
  Administered 2019-12-14: 4.8 mg via INTRAVENOUS
  Administered 2019-12-15 (×2): 5.4 mg via INTRAVENOUS
  Administered 2019-12-15: 30 mg via INTRAVENOUS
  Filled 2019-12-14 (×2): qty 30

## 2019-12-14 MED ORDER — PROMETHAZINE HCL 25 MG/ML IJ SOLN: 6.2500 mg | INTRAMUSCULAR | Status: DC | PRN

## 2019-12-14 MED ORDER — KETOROLAC TROMETHAMINE 30 MG/ML IJ SOLN
30.0000 mg | Freq: Three times a day (TID) | INTRAMUSCULAR | Status: DC
  Administered 2019-12-14 – 2019-12-15 (×3): 30 mg via INTRAVENOUS
  Filled 2019-12-14 (×3): qty 1

## 2019-12-14 MED ORDER — OXYCODONE-ACETAMINOPHEN 5-325 MG PO TABS: 1.0000 | ORAL_TABLET | Freq: Four times a day (QID) | ORAL | 0 refills | Status: DC | PRN

## 2019-12-14 MED ORDER — BISACODYL 5 MG PO TBEC: 5.0000 mg | DELAYED_RELEASE_TABLET | Freq: Every day | ORAL | Status: DC | PRN

## 2019-12-14 MED ORDER — LISINOPRIL 20 MG PO TABS
20.0000 mg | ORAL_TABLET | Freq: Every day | ORAL | Status: DC
  Administered 2019-12-15: 20 mg via ORAL
  Filled 2019-12-14 (×2): qty 1

## 2019-12-14 MED ORDER — KETAMINE HCL 10 MG/ML IJ SOLN
INTRAMUSCULAR | Status: AC
  Filled 2019-12-14: qty 1

## 2019-12-14 MED ORDER — ACETAMINOPHEN 325 MG PO TABS: 325.0000 mg | ORAL_TABLET | Freq: Four times a day (QID) | ORAL | Status: DC | PRN

## 2019-12-14 MED ORDER — ONDANSETRON HCL 4 MG/2ML IJ SOLN: 4.0000 mg | Freq: Four times a day (QID) | INTRAMUSCULAR | Status: DC | PRN

## 2019-12-14 MED ORDER — HYDROMORPHONE HCL 1 MG/ML IJ SOLN
0.2500 mg | INTRAMUSCULAR | Status: DC | PRN
  Administered 2019-12-14 (×3): 0.5 mg via INTRAVENOUS

## 2019-12-14 MED ORDER — BUPIVACAINE HCL 0.25 % IJ SOLN
INTRAMUSCULAR | Status: AC
  Filled 2019-12-14: qty 1

## 2019-12-14 MED ORDER — FENTANYL CITRATE (PF) 250 MCG/5ML IJ SOLN
INTRAMUSCULAR | Status: AC
  Filled 2019-12-14: qty 5

## 2019-12-14 MED ORDER — DEXMEDETOMIDINE HCL IN NACL 200 MCG/50ML IV SOLN
INTRAVENOUS | Status: DC | PRN
  Administered 2019-12-14 (×3): 8 ug via INTRAVENOUS
  Administered 2019-12-14: 16 ug via INTRAVENOUS

## 2019-12-14 MED ORDER — PROPOFOL 10 MG/ML IV BOLUS
INTRAVENOUS | Status: DC | PRN
  Administered 2019-12-14: 20 mg via INTRAVENOUS
  Administered 2019-12-14 (×2): 40 mg via INTRAVENOUS

## 2019-12-14 MED ORDER — NALOXONE HCL 0.4 MG/ML IJ SOLN: 0.4000 mg | INTRAMUSCULAR | Status: DC | PRN

## 2019-12-14 MED ORDER — OXYCODONE HCL 5 MG PO TABS: 5.0000 mg | ORAL_TABLET | Freq: Once | ORAL | Status: AC | PRN

## 2019-12-14 MED ORDER — HYDROMORPHONE HCL 1 MG/ML IJ SOLN
0.2500 mg | INTRAMUSCULAR | Status: DC | PRN
  Administered 2019-12-14: 1 mg via INTRAVENOUS

## 2019-12-14 MED ORDER — DOCUSATE SODIUM 100 MG PO CAPS
100.0000 mg | ORAL_CAPSULE | Freq: Two times a day (BID) | ORAL | Status: DC
  Administered 2019-12-14 – 2019-12-15 (×2): 100 mg via ORAL
  Filled 2019-12-14 (×2): qty 1

## 2019-12-14 MED ORDER — BUPRENORPHINE HCL-NALOXONE HCL 8-2 MG SL FILM: 1.0000 | ORAL_FILM | Freq: Two times a day (BID) | SUBLINGUAL | Status: DC

## 2019-12-14 MED ORDER — OXYCODONE HCL 5 MG PO TABS
ORAL_TABLET | ORAL | Status: AC
  Administered 2019-12-14: 5 mg via ORAL
  Filled 2019-12-14: qty 1

## 2019-12-14 MED ORDER — HYDROMORPHONE HCL 1 MG/ML IJ SOLN
1.0000 mg | INTRAMUSCULAR | Status: AC | PRN
  Administered 2019-12-14 (×2): 1 mg via INTRAVENOUS

## 2019-12-14 MED ORDER — TRAZODONE HCL 100 MG PO TABS
100.0000 mg | ORAL_TABLET | Freq: Every day | ORAL | Status: DC
  Administered 2019-12-14: 100 mg via ORAL
  Filled 2019-12-14: qty 1

## 2019-12-14 MED ORDER — DOCUSATE SODIUM 100 MG PO CAPS: 100.0000 mg | ORAL_CAPSULE | Freq: Two times a day (BID) | ORAL | 0 refills | Status: DC

## 2019-12-14 MED ORDER — FENTANYL CITRATE (PF) 250 MCG/5ML IJ SOLN
INTRAMUSCULAR | Status: DC | PRN
  Administered 2019-12-14 (×5): 50 ug via INTRAVENOUS

## 2019-12-14 MED ORDER — CHLORHEXIDINE GLUCONATE 4 % EX LIQD: 60.0000 mL | Freq: Once | CUTANEOUS | Status: DC

## 2019-12-14 MED ORDER — ASPIRIN EC 325 MG PO TBEC
325.0000 mg | DELAYED_RELEASE_TABLET | Freq: Two times a day (BID) | ORAL | Status: DC
  Administered 2019-12-15: 325 mg via ORAL
  Filled 2019-12-14: qty 1

## 2019-12-14 MED ORDER — POVIDONE-IODINE 10 % EX SWAB
2.0000 "application " | Freq: Once | CUTANEOUS | Status: AC
  Administered 2019-12-14: 2 via TOPICAL

## 2019-12-14 MED ORDER — LACTATED RINGERS IV BOLUS
500.0000 mL | Freq: Once | INTRAVENOUS | Status: AC
  Administered 2019-12-14: 500 mL via INTRAVENOUS

## 2019-12-14 MED ORDER — POLYETHYLENE GLYCOL 3350 17 G PO PACK: 17.0000 g | PACK | Freq: Every day | ORAL | Status: DC | PRN

## 2019-12-14 MED ORDER — 0.9 % SODIUM CHLORIDE (POUR BTL) OPTIME
TOPICAL | Status: DC | PRN
  Administered 2019-12-14: 1000 mL

## 2019-12-14 MED ORDER — TIZANIDINE HCL 4 MG PO TABS: 4.0000 mg | ORAL_TABLET | Freq: Three times a day (TID) | ORAL | 0 refills | Status: DC | PRN

## 2019-12-14 SURGICAL SUPPLY — 44 items
BAG ZIPLOCK 12X15 (MISCELLANEOUS) IMPLANT
BENZOIN TINCTURE PRP APPL 2/3 (GAUZE/BANDAGES/DRESSINGS) IMPLANT
BLADE SAW SGTL 18X1.27X75 (BLADE) ×2 IMPLANT
BLADE SURG SZ10 CARB STEEL (BLADE) ×4 IMPLANT
CLSR STERI-STRIP ANTIMIC 1/2X4 (GAUZE/BANDAGES/DRESSINGS) ×2 IMPLANT
COVER PERINEAL POST (MISCELLANEOUS) ×2 IMPLANT
COVER SURGICAL LIGHT HANDLE (MISCELLANEOUS) ×2 IMPLANT
COVER WAND RF STERILE (DRAPES) IMPLANT
CUP ACETBLR 54 OD 100 SERIES (Hips) ×2 IMPLANT
DECANTER SPIKE VIAL GLASS SM (MISCELLANEOUS) ×2 IMPLANT
DRAPE STERI IOBAN 125X83 (DRAPES) ×2 IMPLANT
DRAPE U-SHAPE 47X51 STRL (DRAPES) ×4 IMPLANT
DRESSING AQUACEL AG SP 3.5X6 (GAUZE/BANDAGES/DRESSINGS) ×1 IMPLANT
DRSG AQUACEL AG ADV 3.5X 6 (GAUZE/BANDAGES/DRESSINGS) ×2 IMPLANT
DRSG AQUACEL AG SP 3.5X6 (GAUZE/BANDAGES/DRESSINGS) ×2
DURAPREP 26ML APPLICATOR (WOUND CARE) ×2 IMPLANT
ELECT BLADE TIP CTD 4 INCH (ELECTRODE) ×2 IMPLANT
ELECT REM PT RETURN 15FT ADLT (MISCELLANEOUS) ×2 IMPLANT
ELIMINATOR HOLE APEX DEPUY (Hips) ×2 IMPLANT
GAUZE XEROFORM 1X8 LF (GAUZE/BANDAGES/DRESSINGS) IMPLANT
GLOVE BIOGEL PI IND STRL 8 (GLOVE) ×2 IMPLANT
GLOVE BIOGEL PI INDICATOR 8 (GLOVE) ×2
GLOVE ECLIPSE 7.5 STRL STRAW (GLOVE) ×4 IMPLANT
GOWN STRL REUS W/TWL XL LVL3 (GOWN DISPOSABLE) ×4 IMPLANT
HEAD CERAMIC DELTA 36 PLUS 1.5 (Hips) ×2 IMPLANT
HOLDER FOLEY CATH W/STRAP (MISCELLANEOUS) ×2 IMPLANT
HOOD PEEL AWAY FLYTE STAYCOOL (MISCELLANEOUS) ×4 IMPLANT
KIT TURNOVER KIT A (KITS) IMPLANT
LINER NEUTRAL 36ID 54OD (Liner) ×2 IMPLANT
NEEDLE HYPO 22GX1.5 SAFETY (NEEDLE) ×2 IMPLANT
PACK ANTERIOR HIP CUSTOM (KITS) ×2 IMPLANT
PENCIL SMOKE EVACUATOR (MISCELLANEOUS) IMPLANT
STAPLER VISISTAT 35W (STAPLE) IMPLANT
STEM CORAIL KA10 (Stem) ×2 IMPLANT
STRIP CLOSURE SKIN 1/2X4 (GAUZE/BANDAGES/DRESSINGS) IMPLANT
SUT ETHIBOND NAB CT1 #1 30IN (SUTURE) ×4 IMPLANT
SUT MNCRL AB 3-0 PS2 18 (SUTURE) IMPLANT
SUT VIC AB 0 CT1 36 (SUTURE) ×2 IMPLANT
SUT VIC AB 0 CT2 27 (SUTURE) ×2 IMPLANT
SUT VIC AB 1 CT1 36 (SUTURE) ×2 IMPLANT
SUT VIC AB 2-0 CT1 27 (SUTURE) ×1
SUT VIC AB 2-0 CT1 TAPERPNT 27 (SUTURE) ×1 IMPLANT
TRAY FOLEY MTR SLVR 16FR STAT (SET/KITS/TRAYS/PACK) ×2 IMPLANT
YANKAUER SUCT BULB TIP 10FT TU (MISCELLANEOUS) ×2 IMPLANT

## 2019-12-14 NOTE — Anesthesia Procedure Notes (Signed)
Spinal  Patient location during procedure: OR End time: 12/14/2019 7:32 AM Staffing Performed: resident/CRNA  Resident/CRNA: Minerva Ends, CRNA Preanesthetic Checklist Completed: patient identified, IV checked, site marked, risks and benefits discussed, surgical consent, monitors and equipment checked, pre-op evaluation and timeout performed Spinal Block Patient position: sitting Prep: DuraPrep and site prepped and draped Patient monitoring: heart rate, continuous pulse ox, blood pressure and cardiac monitor Approach: midline Location: L3-4 Injection technique: single-shot Needle Needle type: Sprotte  Needle gauge: 24 G Assessment Sensory level: T6 Additional Notes Expiration date of tray noted and within date.   Patient tolerated procedure well. Howze assisted and supervised spinal-- prep dry at time of insertion

## 2019-12-14 NOTE — Interval H&P Note (Signed)
History and Physical Interval Note:  12/14/2019 7:16 AM  Timothy Mcintosh  has presented today for surgery, with the diagnosis of LEFT HIP DEGENERATIVE JOINT DISEASE.  The various methods of treatment have been discussed with the patient and family. After consideration of risks, benefits and other options for treatment, the patient has consented to  Procedure(s): TOTAL HIP ARTHROPLASTY ANTERIOR APPROACH (Left) as a surgical intervention.  The patient's history has been reviewed, patient examined, no change in status, stable for surgery.  I have reviewed the patient's chart and labs.  Questions were answered to the patient's satisfaction.     Harvie Junior

## 2019-12-14 NOTE — Anesthesia Procedure Notes (Signed)
Date/Time: 12/14/2019 7:25 AM Performed by: Minerva Ends, CRNA Pre-anesthesia Checklist: Patient identified, Emergency Drugs available, Suction available, Patient being monitored and Timeout performed Patient Re-evaluated:Patient Re-evaluated prior to induction Oxygen Delivery Method: Simple face mask Placement Confirmation: positive ETCO2 and breath sounds checked- equal and bilateral Dental Injury: Teeth and Oropharynx as per pre-operative assessment

## 2019-12-14 NOTE — Op Note (Signed)
NAME: Timothy Mcintosh, D[place] MEDICAL RECORD CV:[id number] ACCOUNT [id number] DATE OF BIRTH:11-Feb-1966 FACILITY: [facility] LOCATION: [facility] PHYSICIAN:Rumaisa Schnetzer Starling Manns, MD  OPERATIVE REPORT  DATE OF PROCEDURE:  12/14/2019  PREOPERATIVE DIAGNOSIS:  End-stage degenerative joint disease, left hip.  POSTOPERATIVE DIAGNOSIS:  End-stage degenerative joint disease, left hip.  PROCEDURES:   1.  Left total hip replacement with a Corail stem size 10, a 54 mm Pinnacle porous coated cup, a neutral liner, and a 36 mm +1.5 delta ceramic hip ball. 2.  Interpretation of multiple intraoperative fluoroscopic images.  SURGEON:  Jodi Geralds, MD  ASSISTANT:  Marshia Ly PA-C, was present for the entire case and assisted by manipulation of the leg, retraction of tissues and closing to minimize OR time.  BRIEF HISTORY:  The patient is a 54 year old male with a long history of significant complaints of left hip pain that has been treated conservatively for a prolonged period of time.  X-ray showed severe bone-on-bone change and after failure of all  conservative care, he is taken to the operating room for left total hip replacement.  The patient was optimized presurgically by cessation of smoking for 6 weeks.  The patient has a long-term history of narcotics use, currently on Suboxone.  I attempted  to contact his physician multiple times and they did not call me back, but we will make a plan with our pharmacy here.  He was taken to the operating room for left total hip replacement.  DESCRIPTION OF PROCEDURE:  The patient was taken to the operating room.  After adequate anesthesia was obtained with a spinal anesthetic, the patient was placed supine on the operating table, moved onto the [place] bed.  All bony prominences well padded.   Attention was then turned to the left hip where after routine prep and drape, an incision was made for an anterior approach to the hip, subcutaneous tissue down to the level of the tensor fascia  which was clearly identified.  Fibers were divided and the  muscle was finger dissected.  Retractors were put in place above and below the neck and the provisional capsulotomy was performed with release of the anterior capsule.  Attention was then turned towards the placement of retractors and traction sutures.   A provisional neck cut was made.  Retractors were then put in place above and below the acetabulum.  Labrectomy was performed and the acetabulum was sequentially reamed to a level of 53 mm.   A 54 mm Pinnacle porous coated cup was implanted in 45 degrees  of lateral opening, 30 degrees of anteversion.  Following this, attention was turned towards placement of a final liner.  Attention was then turned to the stem, which was externally rotated, adducted and extended.  Canal was opened.  Initial rasp was  placed.  Interestingly, he had a large, sort of heaped up bone in the medial calcar.  I felt like we got great stability with the 9.  We went ahead and pushed him to 10, but did not want to push past that.  The 10 got a great fit and fill.  We reduced  him with the 10.  Leg length was perfectly symmetric.  So at that point, we opened the 10 Corail stem and placed it, retrialed the length and then put a +1.5 delta ceramic hip ball and 36.  Excellent range of motion and stability were achieved.  I did an  intraoperative stability test.  We then got our final fluoroscopy images.  We then had  put the final head ball in and got our final fluoroscopy images. Then we did a closure of the anterior capsule and then did a closure of the tensor fascia with 0  Vicryl running.  Capsule was closed with #1 Vicryl.  The subcutaneous was closed with 0 and 2-0 Vicryl and then 3-0 Monocryl subcuticular.  Benzoin, Steri-Strips, dry sterile compressive dressing was applied.  The patient was taken to recovery, was noted  to be in satisfactory condition.  Estimated blood loss for procedure was 500 mL.  Of note, [name]  was present for the entire case and assisted throughout with retraction of tissues, manipulation of the leg and closing to minimize OR time.  Multiple  fluoroscopic images were taken to assure adequate fit, fill and location of the stem.  VN/NUANCE  D:12/14/2019 T:12/14/2019 JOB:[id number]

## 2019-12-14 NOTE — Transfer of Care (Signed)
Immediate Anesthesia Transfer of Care Note  Patient: Timothy Mcintosh  Procedure(s) Performed: TOTAL HIP ARTHROPLASTY ANTERIOR APPROACH (Left Hip)  Patient Location: PACU  Anesthesia Type:Spinal  Level of Consciousness: awake and alert   Airway & Oxygen Therapy: Patient Spontanous Breathing and Patient connected to face mask oxygen  Post-op Assessment: Report given to RN and Post -op Vital signs reviewed and stable  Post vital signs: Reviewed and stable  Last Vitals:  Vitals Value Taken Time  BP    Temp    Pulse 62 12/14/19 0938  Resp 8 12/14/19 0938  SpO2 100 % 12/14/19 0938  Vitals shown include unvalidated device data.  Last Pain:  Vitals:   12/14/19 0625  TempSrc:   PainSc: 0-No pain         Complications: No apparent anesthesia complications

## 2019-12-14 NOTE — Anesthesia Postprocedure Evaluation (Signed)
Anesthesia Post Note  Patient: Timothy Mcintosh  Procedure(s) Performed: TOTAL HIP ARTHROPLASTY ANTERIOR APPROACH (Left Hip)     Patient location during evaluation: PACU Anesthesia Type: Spinal Level of consciousness: awake and alert and oriented Pain management: pain level controlled Vital Signs Assessment: post-procedure vital signs reviewed and stable Respiratory status: spontaneous breathing, nonlabored ventilation and respiratory function stable Cardiovascular status: blood pressure returned to baseline Postop Assessment: no apparent nausea or vomiting, spinal receding, patient able to bend at knees, no backache and no headache Anesthetic complications: no Comments: Pain very difficult to control in PACU due to recent suboxone use. Dilaudid, ketamine, and versed used with some relief. Discussed pain control plan with surgeon and my recommendation for dilaudid PCA. Stephannie Peters, MD    Last Vitals:  Vitals:   12/14/19 1200 12/14/19 1230  BP: (!) 160/71 (!) 147/82  Pulse: 72 68  Resp: 10 10  Temp:    SpO2: 100% 100%    Last Pain:  Vitals:   12/14/19 1130  TempSrc:   PainSc: 9                  Kaylyn Layer

## 2019-12-14 NOTE — Discharge Instructions (Signed)

## 2019-12-14 NOTE — Brief Op Note (Signed)
12/14/2019  9:27 AM  PATIENT:  Timothy Mcintosh  54 y.o. male  PRE-OPERATIVE DIAGNOSIS:  LEFT HIP DEGENERATIVE JOINT DISEASE  POST-OPERATIVE DIAGNOSIS:  LEFT HIP DEGENERATIVE JOINT DISEASE  PROCEDURE:  Procedure(s): TOTAL HIP ARTHROPLASTY ANTERIOR APPROACH (Left)  SURGEON:  Surgeon(s) and Role:    Jodi Geralds, MD - Primary  PHYSICIAN ASSISTANT:   ASSISTANTS: jim bethune   ANESTHESIA:   spinal  EBL:  500 mL   BLOOD ADMINISTERED:none  DRAINS: none   LOCAL MEDICATIONS USED:  MARCAINE    and OTHER experel  SPECIMEN:  No Specimen  DISPOSITION OF SPECIMEN:  N/A  COUNTS:  YES  TOURNIQUET:  * No tourniquets in log *  DICTATION: .Other Dictation: Dictation Number [id number]  PLAN OF CARE: Admit to inpatient   PATIENT DISPOSITION:  PACU - hemodynamically stable.   Delay start of Pharmacological VTE agent (>24hrs) due to surgical blood loss or risk of bleeding: no

## 2019-12-14 NOTE — TOC Initial Note (Signed)
Transition of Care [facility]) - Initial/Assessment Note    Patient Details  Name: Timothy Mcintosh MRN: [id number] Date of Birth: 03-09-1966  Transition of Care [facility]) CM/SW Contact:    Lia Hopping, [place] Phone Number: 12/14/2019, 4:51 PM  Clinical Narrative:                 CSW met with the patient to discuss Home Health options and provided choice. Patient reports no preference, an agency in network with his insurance. CSW reached out to [facility]. CSW confirm PT availability. Patient will need a RW ordered prior to discharge.    Expected Discharge Plan: [place] Barriers to Discharge: No Barriers Identified   Patient Goals and CMS Choice   CMS Medicare.gov Compare Post Acute Care list provided to:: Patient Choice offered to / list presented to : Patient  Expected Discharge Plan and Services Expected Discharge Plan: [place] Choice: [place] arrangements for the past 2 months: Single Family Home Expected Discharge Date: 12/14/19                         HH Arranged: PT [place] [facility]: [facility] [employer] Date [place]: 12/14/19 Time [place]: [place] Representative spoke with at [place]: [place] Arrangements/Services Living arrangements for the past 2 months: [place] Lives with:: Spouse Patient language and need for interpreter reviewed:: No Do you feel safe going back to the place where you live?: Yes      Need for Family Participation in Patient Care: Yes (Comment) Care giver support system in place?: Yes (comment)   Criminal Activity/Legal Involvement Pertinent to Current Situation/Hospitalization: No - Comment as needed  Activities of Daily Living Home Assistive Devices/Equipment: Contact lenses ADL Screening (condition at time of admission) Patient's cognitive ability adequate to safely complete daily activities?: Yes Is  the patient deaf or have difficulty hearing?: No Does the patient have difficulty seeing, even when wearing glasses/contacts?: No Does the patient have difficulty concentrating, remembering, or making decisions?: No Patient able to express need for assistance with ADLs?: Yes Does the patient have difficulty dressing or bathing?: No Independently performs ADLs?: Yes (appropriate for developmental age) Does the patient have difficulty walking or climbing stairs?: No Weakness of Legs: None Weakness of Arms/Hands: None  Permission Sought/Granted Permission sought to share information with : Chartered certified accountant granted to share information with : Yes, Verbal Permission Granted              Emotional Assessment Appearance:: Appears stated age Attitude/Demeanor/Rapport: Engaged Affect (typically observed): Pleasant Orientation: : Oriented to Self, Oriented to Place, Oriented to  Time, Oriented to Situation, Fluctuating Orientation (Suspected and/or reported Sundowners) Alcohol / Substance Use: Not Applicable Psych Involvement: No (comment)  Admission diagnosis:  Status post total hip replacement, left [K56.256] Patient Active Problem List   Diagnosis Date Noted  . Status post total hip replacement, left 12/14/2019  . Degenerative tear of meniscus of right knee 09/04/2019  . Primary osteoarthritis of left hip 09/04/2019  . Left lower quadrant abdominal pain 09/04/2019  . Monoallelic mutation of PALB2 gene 05/31/2018  . Genetic testing 05/31/2018  . Hypertension 05/16/2018  . Left elbow pain 05/16/2018  . Family history of genetic disease carrier   . Family history of breast cancer   . Family history of lung  cancer   . Family history of throat cancer   . Post concussion syndrome 09/15/2017  . Restless leg syndrome 09/15/2017  . Rheumatoid arthritis ([place]) 04/01/2016  . Dyslipidemia 12/25/2015  . Tobacco use disorder 12/25/2015  . Insomnia 12/25/2015  .  Primary osteoarthritis of left knee 07/30/2015  . GERD (gastroesophageal reflux disease) 03/04/2014  . Major depression 03/04/2014  . Asthmatic bronchitis 09/19/2013  . ED (erectile dysfunction) 04/19/2013  . Dorsalgia 11/21/2011  . Hyperlipidemia 11/21/2011  . Gout 11/21/2011  . GAD (generalized anxiety disorder) 11/21/2011   PCP:  Darreld Mclean, MD Pharmacy:   [facility] [place] [place] [address] [place] [address] Phone: [phone] Fax: [phone]  [facility] [place] [place] [address] Phone: [phone] Fax: [phone]     Social Determinants of Health (SDOH) Interventions    Readmission Risk Interventions No flowsheet data found.

## 2019-12-14 NOTE — Evaluation (Signed)
Physical Therapy Evaluation Patient Details Name: Timothy Mcintosh MRN: [id number] DOB: 1965-12-15 Today's Date: 12/14/2019   History of Present Illness  54 yo male s/p L DA-THA on 12/14/19. PMH includes OA, HTN, RA, RLS, GERD, depression, HLD, C5-C6 surgery.  Clinical Impression  Pt presents with severe L hip pain, L hip post-operative weakness, unsteadiness in standing, decreased activity tolerance due to pain. Pt to benefit from acute PT to address deficits. Pt ambulated ~20 ft with RW with min guard assist, distance limited by pain and pt requiring verbal cuing for form and safety. Pt educated on ankle pumps (20/hour) to perform this afternoon/evening to increase circulation, to pt's tolerance and limited by pain. PT to progress mobility as tolerated, and will continue to follow acutely.        Follow Up Recommendations Follow surgeon's recommendation for DC plan and follow-up therapies;Supervision for mobility/OOB(HHPT)    Equipment Recommendations  Rolling walker with 5" wheels;3in1 (PT)    Recommendations for Other Services       Precautions / Restrictions Precautions Precautions: Fall Restrictions Weight Bearing Restrictions: No Other Position/Activity Restrictions: WBAT      Mobility  Bed Mobility Overal bed mobility: Needs Assistance Bed Mobility: Supine to Sit;Sit to Supine     Supine to sit: Min assist;HOB elevated Sit to supine: Min assist;HOB elevated   General bed mobility comments: Min assist for LLE lifting and translation to and from EOB, increased time and effort to perform with use of bedrails.  Transfers Overall transfer level: Needs assistance Equipment used: Rolling walker (2 wheeled) Transfers: Sit to/from Stand Sit to Stand: Min guard;From elevated surface         General transfer comment: Min guard for safety, verbal cuing for hand placement when rising. Increased time and effort.  Ambulation/Gait Ambulation/Gait assistance: Min guard Gait  Distance (Feet): 20 Feet Assistive device: Rolling walker (2 wheeled) Gait Pattern/deviations: Step-to pattern;Decreased step length - right;Decreased step length - left;Antalgic;Trunk flexed Gait velocity: decr   General Gait Details: Min guard for safety, verbal cuing for upright posture, placement in RW, sequencing.  Stairs            Wheelchair Mobility    Modified Rankin (Stroke Patients Only)       Balance Overall balance assessment: Mild deficits observed, not formally tested                                           Pertinent Vitals/Pain Pain Assessment: 0-10 Pain Score: 8  Pain Location: L hip Pain Descriptors / Indicators: Stabbing;Sore Pain Intervention(s): Limited activity within patient's tolerance;Monitored during session;Premedicated before session;Repositioned    Home Living Family/patient expects to be discharged to:: Private residence Living Arrangements: Spouse/significant other Available Help at Discharge: Family;Available 24 hours/day(took 1 week off to care for pt) Type of Home: House Home Access: Stairs to enter Entrance Stairs-Rails: None Entrance Stairs-Number of Steps: 1 Home Layout: One level Home Equipment: Crutches      Prior Function Level of Independence: Independent         Comments: 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 Pt goes to a pain clinic.     Hand Dominance   Dominant Hand: Right    Extremity/Trunk Assessment   Upper Extremity Assessment Upper Extremity Assessment: Overall WFL for tasks assessed    Lower Extremity Assessment Lower Extremity Assessment: Overall WFL for  tasks assessed;LLE deficits/detail LLE Deficits / Details: anticipated post-operative weakness; able to perform heel slide, quad set, ankle pump LLE Sensation: WNL    Cervical / Trunk Assessment Cervical / Trunk Assessment: Normal  Communication   Communication: No difficulties  Cognition  Arousal/Alertness: Awake/alert Behavior During Therapy: WFL for tasks assessed/performed Overall Cognitive Status: Within Functional Limits for tasks assessed                                        General Comments      Exercises     Assessment/Plan    PT Assessment Patient needs continued PT services  PT Problem List Decreased strength;Decreased mobility;Decreased activity tolerance;Decreased balance;Decreased safety awareness;Decreased knowledge of use of DME;Pain;Decreased range of motion       PT Treatment Interventions DME instruction;Therapeutic activities;Gait training;Patient/family education;Therapeutic exercise;Stair training;Balance training;Functional mobility training    PT Goals (Current goals can be found in the Care Plan section)  Acute Rehab PT Goals Patient Stated Goal: get pain under control PT Goal Formulation: With patient Time For Goal Achievement: 12/21/19 Potential to Achieve Goals: Good    Frequency 7X/week   Barriers to discharge        Co-evaluation               AM-PAC PT "6 Clicks" Mobility  Outcome Measure Help needed turning from your back to your side while in a flat bed without using bedrails?: A Little Help needed moving from lying on your back to sitting on the side of a flat bed without using bedrails?: A Little Help needed moving to and from a bed to a chair (including a wheelchair)?: A Little Help needed standing up from a chair using your arms (e.g., wheelchair or bedside chair)?: A Little Help needed to walk in hospital room?: A Little Help needed climbing 3-5 steps with a railing? : A Little 6 Click Score: 18    End of Session Equipment Utilized During Treatment: Gait belt Activity Tolerance: Patient tolerated treatment well;Patient limited by pain Patient left: in bed;with call bell/phone within reach;with bed alarm set;with family/visitor present;with SCD's reapplied Nurse Communication: Mobility  status PT Visit Diagnosis: Other abnormalities of gait and mobility (R26.89);Difficulty in walking, not elsewhere classified (R26.2)    Time: 1332-1400 PT Time Calculation (min) (ACUTE ONLY): 28 min   Charges:   PT Evaluation $PT Eval Low Complexity: 1 Low PT Treatments $Gait Training: 8-22 mins       Becka Lagasse E, PT Acute Rehabilitation Services Pager [phone]  Office [phone]  Keithen Capo D Despina Hidden 12/14/2019, 3:16 PM

## 2019-12-15 DIAGNOSIS — M1612 Unilateral primary osteoarthritis, left hip: Secondary | ICD-10-CM | POA: Diagnosis not present

## 2019-12-15 MED ORDER — ASPIRIN 325 MG PO TBEC: 325.0000 mg | DELAYED_RELEASE_TABLET | Freq: Two times a day (BID) | ORAL | 0 refills | Status: DC

## 2019-12-15 MED ORDER — BUPRENORPHINE HCL-NALOXONE HCL 8-2 MG SL SUBL
1.0000 | SUBLINGUAL_TABLET | Freq: Two times a day (BID) | SUBLINGUAL | Status: DC
  Administered 2019-12-15: 1 via SUBLINGUAL
  Filled 2019-12-15: qty 1

## 2019-12-15 MED FILL — OXYCODONE-APAP 10-325: 10-325 | 5 days supply | Qty: 30 | Fill #0

## 2019-12-15 NOTE — TOC Progression Note (Signed)
Transition of Care [facility]) - Progression Note    Patient Details  Name: Timothy Mcintosh MRN: [id number] Date of Birth: 06-23-1966  Transition of Care [facility]) CM/SW Contact  Armanda Heritage, RN Phone Number: 12/15/2019, 9:58 AM  Clinical Narrative:    CM spoke with patient regarding dme needs.  [employer] to deliver rolling walker and 3in1 to bedside for home use. Patient previously set up with [facility] for HHPT.     Expected Discharge Plan: Home w Home Health Services Barriers to Discharge: No Barriers Identified  Expected Discharge Plan and Services Expected Discharge Plan: Home w Home Health Services   Discharge Planning Services: CM Consult Post Acute Care Choice: Home Health Living arrangements for the past 2 months: Single Family Home Expected Discharge Date: 12/15/19               DME Arranged: Dan Humphreys rolling, 3-N-1 DME Agency: [employer] Date DME Agency Contacted: 12/15/19 Time DME Agency Contacted: [phone] Representative spoke with at DME Agency: Ledell Noss HH Arranged: PT HH Agency: [facility] Date HH Agency Contacted: 12/14/19 Time HH Agency Contacted: [phone] Representative spoke with at [facility] Agency: Kathlene November   Social Determinants of Health (SDOH) Interventions    Readmission Risk Interventions No flowsheet data found.

## 2019-12-15 NOTE — Progress Notes (Signed)
    Patient doing well, minimal well controlled pain this am, has been up and walking, cleared by PT, eager to progress home. + passing gas, + b/b function, eating well,    Physical Exam: Vitals:   12/15/19 0845 12/15/19 0933  BP:  125/73  Pulse:  84  Resp: 14   Temp:  99.5 F (37.5 C)  SpO2: 98% 98%    Dressing in place, CDI, distal compartments soft, sitting up comfortably, NVI  POD #1 s/p L THA, doing well  - up with PT/OT, encourage ambulation - ASA 325 BID for anticoagulation  - tizanidine for muscle spasms - Pain meds managed by px mngmt  - likely d/c home today with f/u in 2 weeks

## 2019-12-15 NOTE — Progress Notes (Signed)
Physical Therapy Treatment Patient Details Name: Timothy Mcintosh MRN: [id number] DOB: 08/19/1966 Today's Date: 12/15/2019    History of Present Illness 54 yo male s/p L DA-THA on 12/14/19. PMH includes OA, HTN, RA, RLS, GERD, depression, HLD, C5-C6 surgery.    PT Comments    Pt doing well with gait with RW in hallway.  Pt is anxious to go home and feel he is at a level that he could go home with family support.   Follow Up Recommendations  Follow surgeon's recommendation for DC plan and follow-up therapies;Supervision for mobility/OOB     Equipment Recommendations  Rolling walker with 5" wheels;3in1 (PT)    Recommendations for Other Services       Precautions / Restrictions Precautions Precautions: Fall Restrictions Weight Bearing Restrictions: No    Mobility  Bed Mobility Overal bed mobility: Modified Independent Bed Mobility: Supine to Sit     Supine to sit: Modified independent (Device/Increase time)     General bed mobility comments: bed flat with minimal use of rail  Transfers Overall transfer level: Needs assistance Equipment used: Rolling walker (2 wheeled) Transfers: Sit to/from Stand Sit to Stand: Min guard         General transfer comment: Cues for hand placement and min/guard only for safety  Ambulation/Gait Ambulation/Gait assistance: Min guard;Supervision Gait Distance (Feet): 75 Feet Assistive device: Rolling walker (2 wheeled) Gait Pattern/deviations: Step-to pattern;Decreased step length - left Gait velocity: decr   General Gait Details: Cues to not pick up RW and for sequencing.  Once he got into pattern, he was able to perform well   Stairs Stairs: (verbally reviewed)           Wheelchair Mobility    Modified Rankin (Stroke Patients Only)       Balance Overall balance assessment: Mild deficits observed, not formally tested                                          Cognition Arousal/Alertness:  Awake/alert Behavior During Therapy: WFL for tasks assessed/performed Overall Cognitive Status: Within Functional Limits for tasks assessed                                        Exercises Total Joint Exercises Ankle Circles/Pumps: AROM;Both;10 reps Heel Slides: AROM;Left;10 reps;Supine Hip ABduction/ADduction: AROM;Left;10 reps;Supine    General Comments        Pertinent Vitals/Pain Pain Assessment: 0-10 Pain Score: 6  Pain Location: L hip Pain Descriptors / Indicators: Sore;Operative site guarding Pain Intervention(s): Limited activity within patient's tolerance;Monitored during session;Repositioned;PCA encouraged;Ice applied    Home Living                      Prior Function            PT Goals (current goals can now be found in the care plan section) Acute Rehab PT Goals Potential to Achieve Goals: Good Progress towards PT goals: Progressing toward goals    Frequency    7X/week      PT Plan Current plan remains appropriate    Co-evaluation              AM-PAC PT "6 Clicks" Mobility   Outcome Measure  Help needed turning from your back to your side while in  a flat bed without using bedrails?: A Little Help needed moving from lying on your back to sitting on the side of a flat bed without using bedrails?: A Little Help needed moving to and from a bed to a chair (including a wheelchair)?: A Little Help needed standing up from a chair using your arms (e.g., wheelchair or bedside chair)?: A Little Help needed to walk in hospital room?: A Little Help needed climbing 3-5 steps with a railing? : A Little 6 Click Score: 18    End of Session Equipment Utilized During Treatment: Gait belt Activity Tolerance: Patient tolerated treatment well Patient left: in chair;with call bell/phone within reach Nurse Communication: Mobility status PT Visit Diagnosis: Other abnormalities of gait and mobility (R26.89);Difficulty in walking, not  elsewhere classified (R26.2)     Time: 1950-9326 PT Time Calculation (min) (ACUTE ONLY): 25 min  Charges:  $Gait Training: 8-22 mins $Therapeutic Exercise: 8-22 mins                     Timothy Mcintosh L. Katrinka Blazing, Timothy Mcintosh Pager [phone] 12/15/2019    Timothy Mcintosh 12/15/2019, 10:07 AM

## 2019-12-17 ENCOUNTER — Encounter: Payer: Self-pay | Admitting: *Deleted

## 2019-12-20 NOTE — Discharge Summary (Signed)
Patient ID: Timothy Mcintosh MRN: [id number] DOB/AGE: 1965-11-25 54 y.o.  Admit date: 12/14/2019 Discharge date: 12/14/2018  Admission Diagnoses:  Principal Problem:   Primary osteoarthritis of left hip Active Problems:   Status post total hip replacement, left   Discharge Diagnoses:  Same  Past Medical History:  Diagnosis Date  . Family history of breast cancer   . Family history of genetic disease carrier    sister PALB2+  . Family history of lung cancer   . Family history of throat cancer   . Headache   . Hyperlipidemia   . Hypertension   . Rheumatoid arthritis ([place])   . Vision abnormalities     Surgeries: Procedure(s): TOTAL HIP ARTHROPLASTY ANTERIOR APPROACH on 12/14/2019   Consultants: None  Discharged Condition: Improved  Hospital Course: Timothy Mcintosh is an 54 y.o. male who was admitted 12/14/2019 for operative treatment of Primary osteoarthritis of left hip. Patient has severe unremitting pain that affects sleep, daily activities, and work/hobbies. After pre-op clearance the patient was taken to the operating room on 12/14/2019 and underwent  Procedure(s): TOTAL HIP ARTHROPLASTY ANTERIOR APPROACH.    Patient was given perioperative antibiotics:  Anti-infectives (From admission, onward)   Start     Dose/Rate Route Frequency Ordered Stop   12/14/19 1330  ceFAZolin (ANCEF) IVPB 2g/100 mL premix     2 g 200 mL/hr over 30 Minutes Intravenous Every 6 hours 12/14/19 1012 12/14/19 2100   12/14/19 0600  ceFAZolin (ANCEF) IVPB 2g/100 mL premix     2 g 200 mL/hr over 30 Minutes Intravenous On call to O.R. 12/14/19 0541 12/14/19 0803       Patient was given sequential compression devices, early ambulation to prevent DVT.  Patient benefited maximally from hospital stay and there were no complications.    Recent vital signs: BP 125/73 (BP Location: Right Arm)   Pulse 84   Temp 99.5 F (37.5 C) (Oral)   Resp 18   Ht 5' 7"  (1.702 m)   Wt 74.5 kg   SpO2 99%    BMI 25.72 kg/m    Discharge Medications:   Allergies as of 12/15/2019   No Known Allergies     Medication List    STOP taking these medications   Pennsaid 2 % Soln Generic drug: Diclofenac Sodium   predniSONE 10 MG tablet Commonly known as: DELTASONE   sildenafil 20 MG tablet Commonly known as: REVATIO     TAKE these medications   allopurinol 300 MG tablet Commonly known as: ZYLOPRIM Take 1 tablet (300 mg total) by mouth daily.   aspirin EC 325 MG tablet Take 1 tablet (325 mg total) by mouth 2 (two) times daily after a meal. Take x 1 month post op to decrease risk of blood clots.   aspirin 325 MG EC tablet Take 1 tablet (325 mg total) by mouth 2 (two) times daily after a meal.   docusate sodium 100 MG capsule Commonly known as: Colace Take 1 capsule (100 mg total) by mouth 2 (two) times daily.   gabapentin 300 MG capsule Commonly known as: NEURONTIN Take one in the evening and 1 or 2 at bedtime What changed:   how much to take  how to take this  when to take this  additional instructions   lisinopril 20 MG tablet Commonly known as: ZESTRIL TAKE 1 AND 1/2 TABLET BY MOUTH DAILY FOR BLOOD PRESSURE, MAY INCREASE TO 2 TABLETS BY MOUTH DAILY IF NEEDED What changed: See the  new instructions.   multivitamin capsule Take 1 capsule by mouth daily.   omeprazole 40 MG capsule Commonly known as: PRILOSEC Take 1 capsule (40 mg total) by mouth daily.   simvastatin 40 MG tablet Commonly known as: ZOCOR TAKE ONE TABLET BY MOUTH DAILY   Suboxone 8-2 MG Film Generic drug: Buprenorphine HCl-Naloxone HCl Place 1 Film under the tongue 2 (two) times daily.   tiZANidine 4 MG tablet Commonly known as: Zanaflex Take 1 tablet (4 mg total) by mouth every 8 (eight) hours as needed for muscle spasms.   traZODone 100 MG tablet Commonly known as: DESYREL TAKE ONE TABLET BY MOUTH  AT BEDTIME AS NEEDED SLEEP What changed: See the new instructions.       Diagnostic  Studies: DG Chest 2 View  Result Date: 12/11/2019 CLINICAL DATA:  Preop for hip replacement. EXAM: CHEST - 2 VIEW COMPARISON:  April 24, 2018. FINDINGS: The heart size and mediastinal contours are within normal limits. Both lungs are clear. The visualized skeletal structures are unremarkable. IMPRESSION: No active cardiopulmonary disease. Electronically Signed   By: Marijo Conception M.D.   On: 12/11/2019 16:52   DG C-Arm 1-60 Min-No Report  Result Date: 12/14/2019 Fluoroscopy was utilized by the requesting physician.  No radiographic interpretation.   DG HIP OPERATIVE UNILAT W OR W/O PELVIS LEFT  Result Date: 12/14/2019 CLINICAL DATA:  Left hip replacement EXAM: OPERATIVE LEFT HIP WITH PELVIS COMPARISON:  None. FLUOROSCOPY TIME:  Radiation Exposure Index (as provided by the fluoroscopic device): 2.48 mGy If the device does not provide the exposure index: Fluoroscopy Time:  20 seconds Number of Acquired Images:  2 FINDINGS: Left hip replacement is noted in satisfactory position. No acute bony or soft tissue abnormality is noted. IMPRESSION: Left hip replacement Electronically Signed   By: Inez Catalina M.D.   On: 12/14/2019 09:25    Disposition: Discharge disposition: 01-Home or Self Care       Discharge Instructions    Discharge patient   Complete by: As directed    D/C home after PT/OT and rounds Px meds to be managed by Pain Management team upon D/C   Discharge disposition: 01-Home or Self Care   Discharge patient date: 12/15/2019     POD #1 s/p L THA, doing well  - up with PT/OT, encourage ambulation - ASA 325 BID for anticoagulation  - tizanidine for muscle spasms - Pain meds managed by px mngmt  -D/C today  -F/U in office 2 weeks   Signed: Justice Mcintosh 12/20/2019, 11:43 AM

## 2019-12-29 ENCOUNTER — Other Ambulatory Visit: Payer: Self-pay | Admitting: Family Medicine

## 2019-12-29 DIAGNOSIS — I10 Essential (primary) hypertension: Secondary | ICD-10-CM

## 2019-12-29 DIAGNOSIS — E785 Hyperlipidemia, unspecified: Secondary | ICD-10-CM

## 2020-01-02 ENCOUNTER — Other Ambulatory Visit: Payer: Self-pay

## 2020-01-02 ENCOUNTER — Ambulatory Visit: Payer: 59 | Attending: Orthopedic Surgery | Admitting: Physical Therapy

## 2020-01-02 DIAGNOSIS — R2689 Other abnormalities of gait and mobility: Secondary | ICD-10-CM | POA: Insufficient documentation

## 2020-01-02 DIAGNOSIS — R262 Difficulty in walking, not elsewhere classified: Secondary | ICD-10-CM | POA: Diagnosis present

## 2020-01-02 DIAGNOSIS — M25652 Stiffness of left hip, not elsewhere classified: Secondary | ICD-10-CM | POA: Diagnosis not present

## 2020-01-02 DIAGNOSIS — M6281 Muscle weakness (generalized): Secondary | ICD-10-CM | POA: Insufficient documentation

## 2020-01-02 DIAGNOSIS — M25552 Pain in left hip: Secondary | ICD-10-CM | POA: Diagnosis present

## 2020-01-02 NOTE — Patient Instructions (Signed)
  Desensitization Techniques  One way to desensitize a painful incision or area is by rubbing it with different textures. This will make your limb more tolerant to touch and pressure. Before you begin, make sure your hands and the materials you're using are clean.  To rub your painful/sensitive area with different textures:  . Sit in a comfortable position with the painful/sensitive area uncovered. . Start with a material that is soft, such as a cotton ball, silk or soft towel. Rub your painful/sensitive area in all directions. Start with a light pressure and gradually increase the pressure. . Vary the textures you use as you are able to tolerate them. Start with soft materials like cotton balls or a makeup brush. Progress to materials that are rougher. Examples include a paper towel, cloth towel, wool, or velcro. As you progress, gradually increase the pressure and roughness of the texture you use. Marland Kitchen Be careful not to rub over your incision if you still have staples or sutures in place, or if there are any open areas. . Rub your limb for at least 30 seconds progressing up to a minute or two as often as you can tolerate, or as recommended by your healthcare provider.  Other methods for desensitization include:  Marland Kitchen Dipping your affected area into a bowl of dry rice, sand, kidney beans, cold water or warm water. . Allow cool or warm water to run over the area. . Using a TENS unit - make sure to ask your physician or physical therapist prior to using one. If you choose to start with the method of dipping your affected area into a medium such as rice, sand, or water make sure to move the affected area around in the bowl until you cannot tolerate it or you reach one to two minutes, whichever comes first. Use a combination of these methods for 10 to 15 minutes, 3-4 times per day.  The goal of desensitization is to inhibit or interrupt the body's interpretation of routine stimuli as painful. It does not  assure that these stimuli will become pleasant or enjoyable, but that they will no longer provoke an extreme pain response.

## 2020-01-02 NOTE — Therapy (Signed)
[facility] [address]  [place], [place], [address] Phone: [phone]   Fax:  [phone]  Physical Therapy Evaluation  Patient Details  Name: Timothy Mcintosh MRN: [id number] Date of Birth: [birth year] Referring Provider (PT): Dorna Leitz, MD   Encounter Date: 01/02/2020  PT End of Session - 01/02/20 0932    Visit Number  1    Number of Visits  12    Date for PT Re-Evaluation  02/13/20    Authorization Type  [employer] - prior auth required    PT Start Time  0932    PT Stop Time  1024    PT Time Calculation (min)  52 min    Equipment Utilized During Treatment  Gait belt    Activity Tolerance  Patient tolerated treatment well;Patient limited by pain    Behavior During Therapy  [facility] for tasks assessed/performed       Past Medical History:  Diagnosis Date  . Family history of breast cancer   . Family history of genetic disease carrier    sister PALB2+  . Family history of lung cancer   . Family history of throat cancer   . Headache   . Hyperlipidemia   . Hypertension   . Rheumatoid arthritis ([place])   . Vision abnormalities     Past Surgical History:  Procedure Laterality Date  . BACK SURGERY Bilateral    c5 -c6  . CARPAL TUNNEL RELEASE Right   . KNEE SURGERY Bilateral   . TOTAL HIP ARTHROPLASTY Left 12/14/2019   Procedure: TOTAL HIP ARTHROPLASTY ANTERIOR APPROACH;  Surgeon: Dorna Leitz, MD;  Location: [facility];  Service: Orthopedics;  Laterality: Left;    There were no vitals filed for this visit.   Subjective Assessment - 01/02/20 0936    Subjective  Patient ~2.5 weeks s/p L THA via anterior approach. He reports increased pain issues initially requiring extra night hospital stay but now steadily improving. Had [place] PT x 2 weeks upon D/C from hospital. Still using RW but interested in trying to wean to cane. Had slept in recliner until last night due to uncomfortable in bed and still sleeping with HOB  elevated. Notes hypersensitivity in posterior lateral L thigh with ttp t/o majority of proximal L LE musculature.    Pertinent History  L THA 12/14/19 (anterior approach), neck surgery, lumbar DDD, B knee surgery    Limitations  Sitting;Standing;Walking;House hold activities    How long can you sit comfortably?  unlimited in recliner but    How long can you stand comfortably?  30 minutes    How long can you walk comfortably?  limited distances (300-500 yds) but winded after ~60 yds    Patient Stated Goals  "to get back to walking and recovery from surgery w/o developing bad habits"    Currently in Pain?  No/denies    Pain Score  0-No pain   up to 3/10   Pain Location  Hip    Pain Orientation  Left;Lateral;Medial    Pain Descriptors / Indicators  Aching;Sharp    Pain Type  Surgical pain;Acute pain    Pain Radiating Towards  medial knee    Pain Onset  1 to 4 weeks ago    Pain Frequency  Intermittent    Aggravating Factors   getting in/out of tub shower, being up on feet for a while    Pain Relieving Factors  ice, Tylenol    Effect of Pain  on Daily Activities  limited activity tolerance, difficulty with stair navigation         [facility] PT Assessment - 01/02/20 0932      Assessment   Medical Diagnosis  L THA    Referring Provider (PT)  Dorna Leitz, MD    Onset Date/Surgical Date  12/14/19    Next MD Visit  01/24/20      Precautions   Precautions  Anterior Hip      Restrictions   Weight Bearing Restrictions  Yes    LLE Weight Bearing  Weight bearing as tolerated      Balance Screen   Has the patient fallen in the past 6 months  No    Has the patient had a decrease in activity level because of a fear of falling?   No    Is the patient reluctant to leave their home because of a fear of falling?   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)    Leisure  fishing (owns a boat); time with grandkids (fishing & hunting); very active but no regimented exercise      Cognition   Overall Cognitive Status  Within Functional Limits for tasks assessed      ROM / Strength   AROM / PROM / Strength  AROM;Strength      AROM   Overall AROM Comments  L hip A/PROM limited in all planes due to pain and tightness      Strength   Overall Strength Comments  tested in sitting due to unable to tolerate side lying or prone and limited tolerance for supine    Strength Assessment Site  Hip;Knee    Right/Left Hip  Right;Left    Right Hip Flexion  4+/5    Right Hip Extension  4/5    Right Hip External Rotation   4/5    Right Hip Internal Rotation  4+/5    Right Hip ABduction  4+/5    Right Hip ADduction  4+/5    Left Hip Flexion  3+/5    Left Hip Extension  3-/5    Left Hip External Rotation  3+/5    Left Hip Internal Rotation  3+/5    Left Hip ABduction  3-/5    Left Hip ADduction  3/5    Right/Left Knee  Right;Left    Right Knee Flexion  4-/5    Right Knee Extension  4/5    Left Knee Flexion  4-/5    Left Knee Extension  4/5      Flexibility   Soft Tissue Assessment /Muscle Length  yes    Hamstrings  mod tight L    Quadriceps  mod/severe tight L; severely tight hip flexors    ITB  mod/severe tight L    Piriformis  mod/severe tight L    Obturator Internus  mod/severe tight L  Palpation   Palpation comment  Ttp with increased muscle tension in hip flexors, quads, adductors, glutes, piriformis & ITB. Pt reporting some hypersensitivity along incision and posterior thigh.      Transfers   Transfers  Supine to  Sit;Sit to Stand    Sit to Stand  6: Modified independent (Device/Increase time);With armrests    Supine to Sit  6: Modified independent (Device/Increase time);Multiple attempts   requires assist for L LE from R LE     Ambulation/Gait   Ambulation/Gait  Yes    Ambulation/Gait Assistance  5: Supervision;4: Min guard    Ambulation/Gait Assistance Details  Provided instruction in proper sequencing and reciprocal step-through pattern with SPC, avoiding lateral lean. Cues for even stride length and upright posture with step through gait with RW.    Ambulation Distance (Feet)  90 Feet   x2   Assistive device  Rolling walker;Straight cane    Gait Pattern  Step-through pattern;Decreased weight shift to left;Decreased stance time - left;Decreased step length - left;Decreased stride length;Antalgic;Lateral trunk lean to right;Trunk flexed    Ambulation Surface  Level;Indoor    Gait velocity  decreased    Gait velocity - backwards                   Objective measurements completed on examination: See above findings.      [facility] Adult PT Treatment/Exercise - 01/02/20 0932      Self-Care   Self-Care  Posture;Other Self-Care Comments    Posture  Encouraged neutral hip extension in standing with elevation of RW height as well as recommending patient attempt to gradually lower HOB elevation to promote neutral hip and reduce hip flexion while sleeping (was sleeping in recliner and recently switched to adjustable bed with HOB elevated) to reduce risk for hip flexion contracture.    Other Self-Care Comments   Provided instruction in desensitization techniques to address hypersensitivity.      Exercises   Exercises  Knee/Hip      Manual Therapy   Manual Therapy  Other (comment)    Other Manual Therapy  Provided education in self-STM using gentle pressure from fingers, rolling pin or small ball on wall for all upper LE muscle groups.             PT Education - 01/02/20 1024     Education Details  PT eval findings, anticipated POC, desentization techniques, self-STM, postural awareness and initial training with [facility]    Person(s) Educated  Patient    Methods  Explanation;Demonstration;Handout;Verbal cues;Tactile cues    Comprehension  Verbalized understanding;Returned demonstration;Verbal cues required;Tactile cues required;Need further instruction       PT Short Term Goals - 01/02/20 1116      PT SHORT TERM GOAL #1   Title  Patient will be independent with initial HEP    Status  New    Target Date  01/23/20      PT SHORT TERM GOAL #2   Title  Patient will tolerate >/= neutral hip extension to improve tolerance for sleeping and supine exercises    Status  New    Target Date  01/23/20        PT Long Term Goals - 01/02/20 1021      PT LONG TERM GOAL #1   Title  Patient will be independent with ongoing/advanced HEP    Status  New    Target Date  02/13/20      PT LONG TERM GOAL #2   Title  Patient to improve L hip AROM to [facility] without pain provocation    Status  New    Target Date  02/13/20      PT LONG TERM GOAL #3   Title  Patient will demonstrate improved L hip and knee strength to >/= 4/5 to 4+/5 for improved stability and activity tolerance    Status  New    Target Date  02/13/20      PT LONG TERM GOAL #4   Title  Patient will ambulate with normal gait pattern w/o need for AD    Status  New    Target Date  02/13/20      PT LONG TERM GOAL #5   Title  Patient to report ability to perform ADLs, household and work-related tasks without increased pain    Status  New    Target Date  02/13/20             Plan - 01/02/20 1024    Clinical Impression Statement  Timothy Mcintosh is a 54 y/o male who presents to OP PT 19 days s/p L THA via anterior approach. He reports pain gradually improving but remains very ttp throughout proximal L LE musculature with hypersensitivity reported in posterior lateral thigh. He continues to ambulate with RW with antalgic  gait pattern with decreased weight shift to L and decreased L stride length into hip extension. L hip ROM limited in all planes due to pain and tightness with poor tolerance for supine positioning due to limited and painful hip extension (difficulty achieving neutral hip extension). Moderate L hip weakness also evident for all L hip muscles. Pain and limited motion restrict positional and activity tolerance and cause continued dependence on AD for gait. Sharod will benefit from skilled PT to address above deficits to restore normal flexibility and functional ROM, improve strength for better stability and increased ease of mobility, and restore normal gait pattern w/o need for AD.    Personal Factors and Comorbidities  Comorbidity 3+;Time since onset of injury/illness/exacerbation;Past/Current Experience;Profession    Comorbidities  Asthmatic bronchitis, GERD, OA - L hip and knee, R meniscal tear, B knee surgery, LBP/lumbar DDD, neck surgery, Gout    Examination-Activity Limitations  Bathing;Bed Mobility;Bend;Caring for Others;Carry;Dressing;Hygiene/Grooming;Lift;Locomotion Level;Sit;Sleep;Squat;Stairs;Stand;Toileting;Transfers    Examination-Participation Restrictions  Cleaning;Community Activity;Driving;Interpersonal Relationship;Laundry;Shop;Volunteer;Yard Work    [employer]  Evolving/Moderate complexity    Clinical Decision Making  Moderate    Rehab Potential  Good    PT Frequency  2x / week    PT Duration  6 weeks    PT Treatment/Interventions  ADLs/Self Care Home Management;Cryotherapy;Electrical Stimulation;Iontophoresis 73m/ml Dexamethasone;Moist Heat;DME Instruction;Gait training;Stair training;Functional mobility training;Therapeutic activities;Therapeutic exercise;Balance training;Neuromuscular re-education;Patient/family education;Manual techniques;Scar mobilization;Passive range of motion;Dry needling;Taping    PT Next Visit Plan  Hip FOTO; Review patient education from  eval as needed; Review and update HH HEP; L hip ROM, Core and LE strengthening; Gait training for symmetrical step-through pattern working [facility] and modalities PRN for pain and abnormal muscle tension    Consulted and Agree with Plan of Care  Patient       Patient will benefit from skilled therapeutic intervention in order to improve the following deficits and impairments:  Abnormal gait, Decreased activity tolerance, Decreased balance, Decreased endurance, Decreased knowledge of precautions, Decreased knowledge of use of DME, Decreased mobility, Decreased range of motion, Decreased safety awareness, Decreased scar mobility, Decreased strength, Difficulty walking, Hypomobility, Increased edema, Increased fascial restricitons, Increased muscle spasms, Impaired perceived functional ability, Impaired  flexibility, Impaired sensation, Improper body mechanics, Postural dysfunction, Pain  Visit Diagnosis: Stiffness of left hip, not elsewhere classified - Plan: PT plan of care cert/re-cert  Pain in left hip - Plan: PT plan of care cert/re-cert  Other abnormalities of gait and mobility - Plan: PT plan of care cert/re-cert  Muscle weakness (generalized) - Plan: PT plan of care cert/re-cert  Difficulty in walking, not elsewhere classified - Plan: PT plan of care cert/re-cert     Problem List Patient Active Problem List   Diagnosis Date Noted  . Status post total hip replacement, left 12/14/2019  . Degenerative tear of meniscus of right knee 09/04/2019  . Primary osteoarthritis of left hip 09/04/2019  . Left lower quadrant abdominal pain 09/04/2019  . Monoallelic mutation of PALB2 gene 05/31/2018  . Genetic testing 05/31/2018  . Hypertension 05/16/2018  . Left elbow pain 05/16/2018  . Family history of genetic disease carrier   . Family history of breast cancer   . Family history of lung cancer   . Family history of throat cancer   . Post concussion syndrome  09/15/2017  . Restless leg syndrome 09/15/2017  . Rheumatoid arthritis ([place]) 04/01/2016  . Dyslipidemia 12/25/2015  . Tobacco use disorder 12/25/2015  . Insomnia 12/25/2015  . Primary osteoarthritis of left knee 07/30/2015  . GERD (gastroesophageal reflux disease) 03/04/2014  . Major depression 03/04/2014  . Asthmatic bronchitis 09/19/2013  . ED (erectile dysfunction) 04/19/2013  . Dorsalgia 11/21/2011  . Hyperlipidemia 11/21/2011  . Gout 11/21/2011  . GAD (generalized anxiety disorder) 11/21/2011    Percival Spanish, PT, MPT 01/02/2020, 11:25 AM  [facility] [address]  [place], [place], [address] Phone: [phone]   Fax:  [phone]  Name: Timothy Mcintosh MRN: [id number] Date of Birth: December 28, 1965

## 2020-01-07 ENCOUNTER — Ambulatory Visit: Payer: 59

## 2020-01-07 ENCOUNTER — Other Ambulatory Visit: Payer: Self-pay

## 2020-01-07 DIAGNOSIS — M25652 Stiffness of left hip, not elsewhere classified: Secondary | ICD-10-CM

## 2020-01-07 DIAGNOSIS — M6281 Muscle weakness (generalized): Secondary | ICD-10-CM

## 2020-01-07 DIAGNOSIS — M25552 Pain in left hip: Secondary | ICD-10-CM

## 2020-01-07 DIAGNOSIS — R2689 Other abnormalities of gait and mobility: Secondary | ICD-10-CM

## 2020-01-07 NOTE — Therapy (Signed)
[facility] [address]  [place], [place], [address] Phone: [phone]   Fax:  [phone]  Physical Therapy Treatment  Patient Details  Name: Timothy Mcintosh MRN: [id number] Date of Birth: Aug 04, 1966 Referring Provider (PT): Dorna Leitz, MD   Encounter Date: 01/07/2020  PT End of Session - 01/07/20 1121    Visit Number  2    Number of Visits  12    Date for PT Re-Evaluation  02/13/20    Authorization Type  [employer] - prior auth required    PT Start Time  1109    PT Stop Time  1158    PT Time Calculation (min)  49 min    Equipment Utilized During Treatment  --    Activity Tolerance  Patient tolerated treatment well;Patient limited by pain    Behavior During Therapy  [facility] for tasks assessed/performed       Past Medical History:  Diagnosis Date  . Family history of breast cancer   . Family history of genetic disease carrier    sister PALB2+  . Family history of lung cancer   . Family history of throat cancer   . Headache   . Hyperlipidemia   . Hypertension   . Rheumatoid arthritis ([place])   . Vision abnormalities     Past Surgical History:  Procedure Laterality Date  . BACK SURGERY Bilateral    c5 -c6  . CARPAL TUNNEL RELEASE Right   . KNEE SURGERY Bilateral   . TOTAL HIP ARTHROPLASTY Left 12/14/2019   Procedure: TOTAL HIP ARTHROPLASTY ANTERIOR APPROACH;  Surgeon: Dorna Leitz, MD;  Location: [facility];  Service: Orthopedics;  Laterality: Left;    There were no vitals filed for this visit.  Subjective Assessment - 01/07/20 1111    Subjective  Pt. doing well.  Has been doing HH HEP.    Pertinent History  L THA 12/14/19 (anterior approach), neck surgery, lumbar DDD, B knee surgery    Patient Stated Goals  "to get back to walking and recovery from surgery w/o developing bad habits"    Currently in Pain?  Yes    Pain Score  2     Pain Orientation  Left;Posterior    Pain Descriptors / Indicators   Aching    Pain Type  Surgical pain    Pain Radiating Towards  into L groin musculature    Pain Frequency  Intermittent    Multiple Pain Sites  No                       [facility] Adult PT Treatment/Exercise - 01/07/20 0001      Ambulation/Gait   Ambulation/Gait  Yes    Ambulation/Gait Assistance  5: Supervision    Ambulation/Gait Assistance Details  cues required for even weight shift and increased L hip knee flexion     Ambulation Distance (Feet)  150 Feet    Assistive device  Straight cane    Gait Pattern  Step-through pattern;Decreased weight shift to left;Decreased stance time - left;Decreased step length - left;Decreased stride length;Antalgic;Lateral trunk lean to right;Trunk flexed    Ambulation Surface  Level;Indoor    Gait velocity  decreased      Knee/Hip Exercises: Stretches   Passive Hamstring Stretch  Left;30 seconds;4 reps    Passive Hamstring Stretch Limitations  supine with strap    Hip Flexor Stretch  Left;2 reps;30 seconds    Hip Flexor Stretch Limitations  mod thomas pos LE resting on 6" step stacked on 8" step - pt. verbalizing L quad/hip flexor stretch     Piriformis Stretch  Left;1 rep;30 seconds    Piriformis Stretch Limitations  gentle - KTOS with towel       Knee/Hip Exercises: Aerobic   Nustep  Lvl 1, 6 min (LE only)       Knee/Hip Exercises: Standing   Heel Raises  Both;15 reps    Heel Raises Limitations  Mirror feedback for even weight distribution; 2 ski poles     Knee Flexion  Right;Left;10 reps;Strengthening    Knee Flexion Limitations  Focusing on even weight shift    2 ski poles    Hip Abduction  Right;Left;10 reps;Knee straight;Stengthening    Abduction Limitations  2 ski poles     Functional Squat  10 reps;3 seconds    Functional Squat Limitations  "mini"; TM rail     Other Standing Knee Exercises  Standing weight shift R/L x 15 reps; mirror feedback for improved weight shift              PT Education - 01/07/20 1214     Education Details  HEP update; standing HS curl, HS stretch, R/L standing weight shift, standing hip abduction    Person(s) Educated  Patient    Methods  Explanation;Demonstration;Verbal cues;Handout    Comprehension  Verbalized understanding;Returned demonstration;Verbal cues required       PT Short Term Goals - 01/07/20 1121      PT SHORT TERM GOAL #1   Title  Patient will be independent with initial HEP    Status  On-going    Target Date  01/23/20      PT SHORT TERM GOAL #2   Title  Patient will tolerate >/= neutral hip extension to improve tolerance for sleeping and supine exercises    Status  On-going    Target Date  01/23/20        PT Long Term Goals - 01/07/20 1121      PT LONG TERM GOAL #1   Title  Patient will be independent with ongoing/advanced HEP    Status  On-going      PT LONG TERM GOAL #2   Title  Patient to improve L hip AROM to [facility] without pain provocation    Status  On-going      PT LONG TERM GOAL #3   Title  Patient will demonstrate improved L hip and knee strength to >/= 4/5 to 4+/5 for improved stability and activity tolerance    Status  On-going      PT LONG TERM GOAL #4   Title  Patient will ambulate with normal gait pattern w/o need for AD    Status  On-going      PT LONG TERM GOAL #5   Title  Patient to report ability to perform ADLs, household and work-related tasks without increased pain    Status  On-going            Plan - 01/07/20 1122    Clinical Impression Statement  Pt. reporting he is still performing HH HEP (seated march and standing hip abduction kicker).  Session focused on gentle LE stretching for improved gait mechanics, gait training for improved L weight shift and hip/knee flexion with SPC, and standing therex for improved L proximal hip strength.  Pt. noting pain ranging from 2-4/10 throughout therex and returning to baseline with rest.  HEP updated.    Comorbidities  Asthmatic bronchitis, GERD, OA - L hip and knee, R  meniscal tear, B knee surgery, LBP/lumbar DDD, neck surgery, Gout    Rehab Potential  Good    PT Treatment/Interventions  ADLs/Self Care Home Management;Cryotherapy;Electrical Stimulation;Iontophoresis 15m/ml Dexamethasone;Moist Heat;DME Instruction;Gait training;Stair training;Functional mobility training;Therapeutic activities;Therapeutic exercise;Balance training;Neuromuscular re-education;Patient/family education;Manual techniques;Scar mobilization;Passive range of motion;Dry needling;Taping    PT Next Visit Plan  Hip FOTO; Review patient education from eval as needed; L hip ROM, Core and LE strengthening; Gait training for symmetrical step-through pattern working [facility] PRN for pain and abnormal muscle tension    PT Home Exercise Plan  HH HEP; seated march, standing hip abduction;  01/07/20 -standing HS curl, HS stretch, R/L standing weight shift, standing hip abduction    Consulted and Agree with Plan of Care  Patient       Patient will benefit from skilled therapeutic intervention in order to improve the following deficits and impairments:  Abnormal gait, Decreased activity tolerance, Decreased balance, Decreased endurance, Decreased knowledge of precautions, Decreased knowledge of use of DME, Decreased mobility, Decreased range of motion, Decreased safety awareness, Decreased scar mobility, Decreased strength, Difficulty walking, Hypomobility, Increased edema, Increased fascial restricitons, Increased muscle spasms, Impaired perceived functional ability, Impaired flexibility, Impaired sensation, Improper body mechanics, Postural dysfunction, Pain  Visit Diagnosis: Stiffness of left hip, not elsewhere classified  Pain in left hip  Other abnormalities of gait and mobility  Muscle weakness (generalized)     Problem List Patient Active Problem List   Diagnosis Date Noted  . Status post total hip replacement, left 12/14/2019  . Degenerative  tear of meniscus of right knee 09/04/2019  . Primary osteoarthritis of left hip 09/04/2019  . Left lower quadrant abdominal pain 09/04/2019  . Monoallelic mutation of PALB2 gene 05/31/2018  . Genetic testing 05/31/2018  . Hypertension 05/16/2018  . Left elbow pain 05/16/2018  . Family history of genetic disease carrier   . Family history of breast cancer   . Family history of lung cancer   . Family history of throat cancer   . Post concussion syndrome 09/15/2017  . Restless leg syndrome 09/15/2017  . [place] 04/01/2016  . Dyslipidemia 12/25/2015  . Tobacco use disorder 12/25/2015  . Insomnia 12/25/2015  . Primary osteoarthritis of left knee 07/30/2015  . GERD (gastroesophageal reflux disease) 03/04/2014  . Major depression 03/04/2014  . Asthmatic bronchitis 09/19/2013  . ED (erectile dysfunction) 04/19/2013  . Dorsalgia 11/21/2011  . Hyperlipidemia 11/21/2011  . Gout 11/21/2011  . GAD (generalized anxiety disorder) 11/21/2011    MBess Harvest PTA 01/07/20 12:20 PM   [facility] [address] [place] [address]Phone: [phone]  Fax:  [phone] Name: KREDMOND WHITTLEYMRN: [id number] of Birth: 4November 21, 1967

## 2020-01-10 ENCOUNTER — Other Ambulatory Visit: Payer: Self-pay

## 2020-01-10 ENCOUNTER — Ambulatory Visit: Payer: 59

## 2020-01-10 DIAGNOSIS — M25652 Stiffness of left hip, not elsewhere classified: Secondary | ICD-10-CM

## 2020-01-10 DIAGNOSIS — R2689 Other abnormalities of gait and mobility: Secondary | ICD-10-CM

## 2020-01-10 DIAGNOSIS — M6281 Muscle weakness (generalized): Secondary | ICD-10-CM

## 2020-01-10 DIAGNOSIS — M25552 Pain in left hip: Secondary | ICD-10-CM

## 2020-01-10 NOTE — Therapy (Signed)
[facility] [address]  [place], [place], [address] Phone: [phone]   Fax:  [phone]  Physical Therapy Treatment  Patient Details  Name: Timothy Mcintosh MRN: [id number] Date of Birth: 1966-01-14 Referring Provider (PT): Dorna Leitz, MD   Encounter Date: 01/10/2020  PT End of Session - 01/10/20 1451    Visit Number  3    Number of Visits  12    Date for PT Re-Evaluation  02/13/20    Authorization Type  [employer] - prior auth required    PT Start Time  2620    PT Stop Time  1525    PT Time Calculation (min)  40 min    Activity Tolerance  Patient tolerated treatment well;Patient limited by pain    Behavior During Therapy  [facility] for tasks assessed/performed       Past Medical History:  Diagnosis Date  . Family history of breast cancer   . Family history of genetic disease carrier    sister PALB2+  . Family history of lung cancer   . Family history of throat cancer   . Headache   . Hyperlipidemia   . Hypertension   . Rheumatoid arthritis ([place])   . Vision abnormalities     Past Surgical History:  Procedure Laterality Date  . BACK SURGERY Bilateral    c5 -c6  . CARPAL TUNNEL RELEASE Right   . KNEE SURGERY Bilateral   . TOTAL HIP ARTHROPLASTY Left 12/14/2019   Procedure: TOTAL HIP ARTHROPLASTY ANTERIOR APPROACH;  Surgeon: Dorna Leitz, MD;  Location: [facility];  Service: Orthopedics;  Laterality: Left;    There were no vitals filed for this visit.  Subjective Assessment - 01/10/20 1449    Subjective  Has been doing the updated HEP everyday.    Pertinent History  L THA 12/14/19 (anterior approach), neck surgery, lumbar DDD, B knee surgery    Patient Stated Goals  "to get back to walking and recovery from surgery w/o developing bad habits"    Currently in Pain?  No/denies    Pain Score  0-No pain    Pain Location  Hip    Pain Orientation  Left;Posterior                       [facility]  Adult PT Treatment/Exercise - 01/10/20 0001      Ambulation/Gait   Ambulation/Gait  Yes    Ambulation/Gait Assistance  5: Supervision    Ambulation/Gait Assistance Details  cues for increased step length and even stance time with SPC    Ambulation Distance (Feet)  90 Feet    Assistive device  Straight cane    Gait Pattern  Step-through pattern;Decreased weight shift to left;Decreased stance time - left;Decreased step length - left;Decreased stride length;Antalgic;Lateral trunk lean to right;Trunk flexed    Ambulation Surface  Level;Indoor      Knee/Hip Exercises: Hydrologist  Left;30 seconds;3 reps    Passive Hamstring Stretch Limitations  supine with strap    Hip Flexor Stretch  Left;2 reps;30 seconds    Hip Flexor Stretch Limitations  mod thomas pos LE resting on 8+4" steps; 2nd set 8"+2" steps     Piriformis Stretch  Left;1 rep;30 seconds    Piriformis Stretch Limitations  gentle - KTOS with strap    Gastroc Stretch  Left;1 rep;30 seconds    Gastroc Stretch Limitations  prostretch at machine  Knee/Hip Exercises: Aerobic   Nustep  Lvl 2, 6 min (LE only)       Knee/Hip Exercises: Machines for Strengthening   Cybex Knee Flexion  B LEs: 15# x 15 reps; B con/L ecc 10# x 15 reps      Knee/Hip Exercises: Standing   Lateral Step Up  Left;10 reps;Step Height: 4";Hand Hold: 2    Lateral Step Up Limitations  at TM rail     Forward Step Up  Left;Step Height: 4";Hand Hold: 1   x 12 reps   Forward Step Up Limitations  at TM rail                PT Short Term Goals - 01/07/20 1121      PT SHORT TERM GOAL #1   Title  Patient will be independent with initial HEP    Status  On-going    Target Date  01/23/20      PT SHORT TERM GOAL #2   Title  Patient will tolerate >/= neutral hip extension to improve tolerance for sleeping and supine exercises    Status  On-going    Target Date  01/23/20        PT Long Term Goals - 01/07/20 1121      PT LONG  TERM GOAL #1   Title  Patient will be independent with ongoing/advanced HEP    Status  On-going      PT LONG TERM GOAL #2   Title  Patient to improve L hip AROM to [facility] without pain provocation    Status  On-going      PT LONG TERM GOAL #3   Title  Patient will demonstrate improved L hip and knee strength to >/= 4/5 to 4+/5 for improved stability and activity tolerance    Status  On-going      PT LONG TERM GOAL #4   Title  Patient will ambulate with normal gait pattern w/o need for AD    Status  On-going      PT LONG TERM GOAL #5   Title  Patient to report ability to perform ADLs, household and work-related tasks without increased pain    Status  On-going            Plan - 01/10/20 1452    Clinical Impression Statement  Timothy Mcintosh reporting he performing updated HEP x2/da.  With noticeable improvement in HS stretch and able to lower 2" on step assisted mod Thomas hip flexor stretch on mat table.  Tolerated mild progression of stepping and addition of HS strengthening on machine today without increased pain.  Did verbalize muscular soreness after last session for remainder of day which resolved next morning.  Ended session with pt. denying pain.    Comorbidities  Asthmatic bronchitis, GERD, OA - L hip and knee, R meniscal tear, B knee surgery, LBP/lumbar DDD, neck surgery, Gout    Rehab Potential  Good    PT Treatment/Interventions  ADLs/Self Care Home Management;Cryotherapy;Electrical Stimulation;Iontophoresis 32m/ml Dexamethasone;Moist Heat;DME Instruction;Gait training;Stair training;Functional mobility training;Therapeutic activities;Therapeutic exercise;Balance training;Neuromuscular re-education;Patient/family education;Manual techniques;Scar mobilization;Passive range of motion;Dry needling;Taping    PT Next Visit Plan  L hip ROM, Core and LE strengthening; Gait training for symmetrical step-through pattern working [facility] Manual [facility] and modalities PRN for pain and  abnormal muscle tension    PT Home Exercise Plan  HH HEP; seated march, standing hip abduction;  01/07/20 -standing HS curl, HS stretch, R/L standing weight shift, standing hip abduction  Consulted and Agree with Plan of Care  Patient       Patient will benefit from skilled therapeutic intervention in order to improve the following deficits and impairments:  Abnormal gait, Decreased activity tolerance, Decreased balance, Decreased endurance, Decreased knowledge of precautions, Decreased knowledge of use of DME, Decreased mobility, Decreased range of motion, Decreased safety awareness, Decreased scar mobility, Decreased strength, Difficulty walking, Hypomobility, Increased edema, Increased fascial restricitons, Increased muscle spasms, Impaired perceived functional ability, Impaired flexibility, Impaired sensation, Improper body mechanics, Postural dysfunction, Pain  Visit Diagnosis: Stiffness of left hip, not elsewhere classified  Pain in left hip  Other abnormalities of gait and mobility  Muscle weakness (generalized)     Problem List Patient Active Problem List   Diagnosis Date Noted  . Status post total hip replacement, left 12/14/2019  . Degenerative tear of meniscus of right knee 09/04/2019  . Primary osteoarthritis of left hip 09/04/2019  . Left lower quadrant abdominal pain 09/04/2019  . Monoallelic mutation of PALB2 gene 05/31/2018  . Genetic testing 05/31/2018  . Hypertension 05/16/2018  . Left elbow pain 05/16/2018  . Family history of genetic disease carrier   . Family history of breast cancer   . Family history of lung cancer   . Family history of throat cancer   . Post concussion syndrome 09/15/2017  . Restless leg syndrome 09/15/2017  . Rheumatoid arthritis ([place]) 04/01/2016  . Dyslipidemia 12/25/2015  . Tobacco use disorder 12/25/2015  . Insomnia 12/25/2015  . Primary osteoarthritis of left knee 07/30/2015  . GERD (gastroesophageal reflux disease) 03/04/2014   . Major depression 03/04/2014  . Asthmatic bronchitis 09/19/2013  . ED (erectile dysfunction) 04/19/2013  . Dorsalgia 11/21/2011  . Hyperlipidemia 11/21/2011  . Gout 11/21/2011  . GAD (generalized anxiety disorder) 11/21/2011    Bess Harvest, PTA 01/10/20 6:19 PM   [facility] [address]  [place], [place], [address] Phone: [phone]   Fax:  [phone]  Name: Timothy Mcintosh MRN: [id number] Date of Birth: 1966-05-21

## 2020-01-14 ENCOUNTER — Encounter: Payer: Self-pay | Admitting: Family Medicine

## 2020-01-14 ENCOUNTER — Ambulatory Visit: Payer: 59

## 2020-01-14 ENCOUNTER — Other Ambulatory Visit: Payer: Self-pay

## 2020-01-14 DIAGNOSIS — M25652 Stiffness of left hip, not elsewhere classified: Secondary | ICD-10-CM

## 2020-01-14 DIAGNOSIS — R2689 Other abnormalities of gait and mobility: Secondary | ICD-10-CM

## 2020-01-14 DIAGNOSIS — M6281 Muscle weakness (generalized): Secondary | ICD-10-CM

## 2020-01-14 DIAGNOSIS — M25552 Pain in left hip: Secondary | ICD-10-CM

## 2020-01-14 NOTE — Therapy (Signed)
[facility] [address]  [place], [place], [address] Phone: [phone]   Fax:  [phone]  Physical Therapy Treatment  Patient Details  Name: Timothy Mcintosh MRN: [id number] Date of Birth: 1966-04-10 Referring Provider (PT): Dorna Leitz, MD   Encounter Date: 01/14/2020  PT End of Session - 01/14/20 0940    Visit Number  4    Number of Visits  12    Date for PT Re-Evaluation  02/13/20    Authorization Type  [employer] - prior auth required    PT Start Time  0930    PT Stop Time  1014    PT Time Calculation (min)  44 min    Activity Tolerance  Patient tolerated treatment well;Patient limited by pain    Behavior During Therapy  [facility] for tasks assessed/performed       Past Medical History:  Diagnosis Date  . Family history of breast cancer   . Family history of genetic disease carrier    sister PALB2+  . Family history of lung cancer   . Family history of throat cancer   . Headache   . Hyperlipidemia   . Hypertension   . Rheumatoid arthritis ([place])   . Vision abnormalities     Past Surgical History:  Procedure Laterality Date  . BACK SURGERY Bilateral    c5 -c6  . CARPAL TUNNEL RELEASE Right   . KNEE SURGERY Bilateral   . TOTAL HIP ARTHROPLASTY Left 12/14/2019   Procedure: TOTAL HIP ARTHROPLASTY ANTERIOR APPROACH;  Surgeon: Dorna Leitz, MD;  Location: [facility];  Service: Orthopedics;  Laterality: Left;    There were no vitals filed for this visit.  Subjective Assessment - 01/14/20 0933    Subjective  Pt. doing well today noting good    Pertinent History  L THA 12/14/19 (anterior approach), neck surgery, lumbar DDD, B knee surgery    Patient Stated Goals  "to get back to walking and recovery from surgery w/o developing bad habits"    Currently in Pain?  No/denies    Pain Score  0-No pain   up to 2/10 ache at times   Pain Location  Hip    Pain Orientation  Left;Posterior                        [facility] Adult PT Treatment/Exercise - 01/14/20 0001      Ambulation/Gait   Ambulation/Gait  Yes    Ambulation/Gait Assistance  5: Supervision    Ambulation/Gait Assistance Details  Pt. with visible increase in L step length and L weight shift; Still requiring cueing for increased L hip flexion     Ambulation Distance (Feet)  180 Feet   90 ft with SPC; 35 Ft with SPC and hip knee march   Pre-Gait Activities  gait with SPC + high knee march for improved L hip/knee flexion       Self-Care   Self-Care  Other Self-Care Comments    Other Self-Care Comments   Visual inspection of incision - well healed with no visible drainage - pt. verbalizing spending 1 min/day in shower with gentle scar massage as previously instructed       Knee/Hip Exercises: Stretches   Hip Flexor Stretch  Left;2 reps;30 seconds    Hip Flexor Stretch Limitations  mod thomas pos LE resting on 8+4" steps; 2nd set 8"+2" steps    Able to rest L LE on 8" step  off table      Knee/Hip Exercises: Aerobic   Recumbent Bike  Lvl 1, 6 min - partial revolutions/full    seat reclined to avoid excessive hip flexion      Knee/Hip Exercises: Standing   Hip Abduction  Right;Left;10 reps;Knee straight;Stengthening    Abduction Limitations  finger tip support at counter     Functional Squat  10 reps;3 seconds    Functional Squat Limitations  counter     SLS  R SLS 2 x 20 sec; L SLS 2 x 5 sec at counter    pt. able to maintain neutral hip alignment with L SLS   Other Standing Knee Exercises  Standing L toe-clears to 9" step x 10 reps at counter       Knee/Hip Exercises: Supine   Bridges with [employer]  Both;10 reps;Strengthening    instructed to perform to beginning of hip flexor stretch    Other Supine Knee/Hip Exercises  Hooklying alternating clam shell with yellow TB at knees x 10 reps      Knee/Hip Exercises: Sidelying   Clams  L clam shell in R sidelying (no resistance) x 10 reps        Manual Therapy   Manual Therapy  Soft tissue mobilization;Myofascial release    Manual therapy comments  in mod thomas hip flexor stretch pos for L    Soft tissue mobilization  STM to L proximal quad and hip flexor; scar massage to L hip incision                PT Short Term Goals - 01/07/20 1121      PT SHORT TERM GOAL #1   Title  Patient will be independent with initial HEP    Status  On-going    Target Date  01/23/20      PT SHORT TERM GOAL #2   Title  Patient will tolerate >/= neutral hip extension to improve tolerance for sleeping and supine exercises    Status  On-going    Target Date  01/23/20        PT Long Term Goals - 01/07/20 1121      PT LONG TERM GOAL #1   Title  Patient will be independent with ongoing/advanced HEP    Status  On-going      PT LONG TERM GOAL #2   Title  Patient to improve L hip AROM to [facility] without pain provocation    Status  On-going      PT LONG TERM GOAL #3   Title  Patient will demonstrate improved L hip and knee strength to >/= 4/5 to 4+/5 for improved stability and activity tolerance    Status  On-going      PT LONG TERM GOAL #4   Title  Patient will ambulate with normal gait pattern w/o need for AD    Status  On-going      PT LONG TERM GOAL #5   Title  Patient to report ability to perform ADLs, household and work-related tasks without increased pain    Status  On-going            Plan - 01/14/20 0943    Clinical Impression Statement  [name] reporting he has been performing scar massage daily x 1 min and reports continued adherence to HEP.  Visual inspection of L hip incision with incision healing well with no visible drainage.  MT addressing tightness/tenderness in L anterior/lateral hip musculature for improved ROM and tissue  quality.  Pt. with visible improvement with L weight shift and L step-length with gait with SPC today.  Notes he is now ambulating with SPC in home and community no longer using RW.  Therex focused  on Improving L weight acceptance and hip/knee flexion/clearance activities for carryover with gait pattern.  Ended session with pt. reporting he was pain free.    Comorbidities  Asthmatic bronchitis, GERD, OA - L hip and knee, R meniscal tear, B knee surgery, LBP/lumbar DDD, neck surgery, Gout    Rehab Potential  Good    PT Treatment/Interventions  ADLs/Self Care Home Management;Cryotherapy;Electrical Stimulation;Iontophoresis 49m/ml Dexamethasone;Moist Heat;DME Instruction;Gait training;Stair training;Functional mobility training;Therapeutic activities;Therapeutic exercise;Balance training;Neuromuscular re-education;Patient/family education;Manual techniques;Scar mobilization;Passive range of motion;Dry needling;Taping    PT Next Visit Plan  L hip ROM, Core and LE strengthening; Gait training for symmetrical step-through pattern working [facility] and modalities PRN for pain and abnormal muscle tension    PT Home Exercise Plan  HH HEP; seated march, standing hip abduction;  01/07/20 -standing HS curl, HS stretch, R/L standing weight shift, standing hip abduction    Consulted and Agree with Plan of Care  Patient       Patient will benefit from skilled therapeutic intervention in order to improve the following deficits and impairments:  Abnormal gait, Decreased activity tolerance, Decreased balance, Decreased endurance, Decreased knowledge of precautions, Decreased knowledge of use of DME, Decreased mobility, Decreased range of motion, Decreased safety awareness, Decreased scar mobility, Decreased strength, Difficulty walking, Hypomobility, Increased edema, Increased fascial restricitons, Increased muscle spasms, Impaired perceived functional ability, Impaired flexibility, Impaired sensation, Improper body mechanics, Postural dysfunction, Pain  Visit Diagnosis: Stiffness of left hip, not elsewhere classified  Pain in left hip  Other abnormalities of gait and  mobility  Muscle weakness (generalized)     Problem List Patient Active Problem List   Diagnosis Date Noted  . Status post total hip replacement, left 12/14/2019  . Degenerative tear of meniscus of right knee 09/04/2019  . Primary osteoarthritis of left hip 09/04/2019  . Left lower quadrant abdominal pain 09/04/2019  . Monoallelic mutation of PALB2 gene 05/31/2018  . Genetic testing 05/31/2018  . Hypertension 05/16/2018  . Left elbow pain 05/16/2018  . Family history of genetic disease carrier   . Family history of breast cancer   . Family history of lung cancer   . Family history of throat cancer   . Post concussion syndrome 09/15/2017  . Restless leg syndrome 09/15/2017  . [place]oid [place] ([place] 04/01/2016  . Dyslipidemia 12/25/2015  . Tobacco use disorder 12/25/2015  . Insomnia 12/25/2015  . Primary osteoarthritis of left knee 07/30/2015  . GERD (gastroesophageal reflux disease) 03/04/2014  . Major depression 03/04/2014  . Asthmatic bronchitis 09/19/2013  . ED (erectile dysfunction) 04/19/2013  . Dorsalgia 11/21/2011  . Hyperlipidemia 11/21/2011  . Gout 11/21/2011  . GAD (generalized anxiety disorder) 11/21/2011    MBess Harvest PTA 01/14/20 12:20 PM   [facility] [address] [place] [address]Phone: [phone]  Fax:  [phone] Name: KACIE CUSTISMRN: [id number] of Birth: [birth year]

## 2020-01-15 ENCOUNTER — Encounter: Payer: Self-pay | Admitting: Family Medicine

## 2020-01-15 ENCOUNTER — Ambulatory Visit (INDEPENDENT_AMBULATORY_CARE_PROVIDER_SITE_OTHER): Payer: 59 | Admitting: Family Medicine

## 2020-01-15 ENCOUNTER — Other Ambulatory Visit: Payer: Self-pay

## 2020-01-15 ENCOUNTER — Ambulatory Visit: Payer: Self-pay

## 2020-01-15 VITALS — BP 132/81 | HR 80 | Ht 67.0 in | Wt 165.0 lb

## 2020-01-15 DIAGNOSIS — M23306 Other meniscus derangements, unspecified meniscus, right knee: Secondary | ICD-10-CM

## 2020-01-15 DIAGNOSIS — M1711 Unilateral primary osteoarthritis, right knee: Secondary | ICD-10-CM

## 2020-01-15 MED ORDER — TRIAMCINOLONE ACETONIDE 40 MG/ML IJ SUSP
40.0000 mg | Freq: Once | INTRAMUSCULAR | Status: AC
  Administered 2020-01-15: 16:00:00 40 mg via INTRA_ARTICULAR

## 2020-01-15 NOTE — Progress Notes (Signed)
Medication Samples have been provided to the patient.  Drug name: Pennsaid       Strength: 2%        Qty: 2 Boxes  LOT: [id number]  Exp.Date: 09/2020  Dosing instructions: Use a pea size amount and rub gently.  The patient has been instructed regarding the correct time, dose, and frequency of taking this medication, including desired effects and most common side effects.   Kathi Simpers, [place] 4:20 PM 01/15/2020

## 2020-01-15 NOTE — Progress Notes (Addendum)
JOHNATAN BASKETTE - 54 y.o. male MRN [id number]  Date of birth: 01-09-1966  SUBJECTIVE:  Including CC & ROS.  Chief Complaint  Patient presents with  . Knee Pain    right knee    CHASIN FINDLING is a 54 y.o. male that is presenting with acute worsening of his right knee pain.  The pain started the past few weeks.  He is recovering from total hip arthroplasty.  The pain is in the medial joint line.  He has a pain with weightbearing.  Seems to be similar to previous pain.  The previous steroid injection helps until just recently..   Review of Systems See HPI   HISTORY: Past Medical, Surgical, Social, and Family History Reviewed & Updated per EMR.   Pertinent Historical Findings include:  Past Medical History:  Diagnosis Date  . Family history of breast cancer   . Family history of genetic disease carrier    sister PALB2+  . Family history of lung cancer   . Family history of throat cancer   . Headache   . Hyperlipidemia   . Hypertension   . Rheumatoid arthritis ([place])   . Vision abnormalities     Past Surgical History:  Procedure Laterality Date  . BACK SURGERY Bilateral    c5 -c6  . CARPAL TUNNEL RELEASE Right   . KNEE SURGERY Bilateral   . TOTAL HIP ARTHROPLASTY Left 12/14/2019   Procedure: TOTAL HIP ARTHROPLASTY ANTERIOR APPROACH;  Surgeon: Dorna Leitz, MD;  Location: [facility];  Service: Orthopedics;  Laterality: Left;    Family History  Problem Relation Age of Onset  . High Cholesterol Father   . Kidney Stones Father   . Heart failure Maternal Grandfather        Diet age 22 of "massive heart failure" unknown if MI or not  . Lung cancer Mother   . Breast cancer Mother 75  . Kidney cancer Mother   . Skin cancer Mother   . Breast cancer Sister 12       PALB2+  . Breast cancer Paternal Grandfather        14's  . Throat cancer Paternal Grandfather   . Other Sister        PALB2+  . Throat cancer Other   . Throat cancer Other     Social History   Socioeconomic  History  . Marital status: Married    Spouse name: Not on file  . Number of children: Not on file  . Years of education: Not on file  . Highest education level: Not on file  Occupational History  . Not on file  Tobacco Use  . Smoking status: Current Every Day Smoker  . Smokeless tobacco: Never Used  . Tobacco comment: smoking since age 63  Substance and Sexual Activity  . Alcohol use: No    Alcohol/week: 0.0 standard drinks  . Drug use: No  . Sexual activity: Not on file  Other Topics Concern  . Not on file  Social History Narrative  . Not on file   Social Determinants of Health   Financial Resource Strain:   . Difficulty of Paying Living Expenses:   Food Insecurity:   . Worried About Charity fundraiser in the Last Year:   . Arboriculturist in the Last Year:   Transportation Needs:   . Film/video editor (Medical):   Marland Kitchen Lack of Transportation (Non-Medical):   Physical Activity:   . Days of Exercise per Week:   .  Minutes of Exercise per Session:   Stress:   . Feeling of Stress :   Social Connections:   . Frequency of Communication with Friends and Family:   . Frequency of Social Gatherings with Friends and Family:   . Attends Religious Services:   . Active Member of Clubs or Organizations:   . Attends Archivist Meetings:   Marland Kitchen Marital Status:   Intimate Partner Violence:   . Fear of Current or Ex-Partner:   . Emotionally Abused:   Marland Kitchen Physically Abused:   . Sexually Abused:      PHYSICAL EXAM:  VS: BP 132/81   Pulse 80   Ht 5' 7"  (1.702 m)   Wt 165 lb (74.8 kg)   BMI 25.84 kg/m  Physical Exam Gen: NAD, alert, cooperative with exam, well-appearing MSK:  Right knee: No obvious effusion. Some tenderness palpation of the medial joint line. Normal range of motion. No instability. Normal strength to resistance. Neurovascularly intact   Aspiration/Injection Procedure Note QUITMAN NORBERTO 1966-05-05  Procedure: Injection Indications: Right  knee pain  Procedure Details Consent: Risks of procedure as well as the alternatives and risks of each were explained to the (patient/caregiver).  Consent for procedure obtained. Time Out: Verified patient identification, verified procedure, site/side was marked, verified correct patient position, special equipment/implants available, medications/allergies/relevent history reviewed, required imaging and test results available.  Performed.  The area was cleaned with iodine and alcohol swabs.    The right knee superior lateral suprapatellar pouch was injected using 1 cc's of 40 mg Kenalog and 4 cc's of 0.25% bupivacaine with a 22 1 1/2" needle.  Ultrasound was used. Images were obtained in long views showing the injection.     A sterile dressing was applied.  Patient did tolerate procedure well.       ASSESSMENT & PLAN:   OA (osteoarthritis) of knee Acutely worsening as of late.  Pain feels similar to previously.  He did well with previous injection. -Injection. -Pennsaid samples provided. -Counseled on home exercise therapy and supportive care. -Could consider physical therapy or updated imaging.

## 2020-01-15 NOTE — Patient Instructions (Signed)
Good to see you Please try ice  Please try the pennsaid   Please send me a message in MyChart with any questions or updates.  Please see me back in 4-6 weeks or as needed.   --Dr. Jordan Likes

## 2020-01-15 NOTE — Assessment & Plan Note (Addendum)
Acutely worsening as of late.  Pain feels similar to previously.  He did well with previous injection. -Injection. -Pennsaid samples provided. -Counseled on home exercise therapy and supportive care. -Could consider physical therapy or updated imaging.

## 2020-01-17 ENCOUNTER — Ambulatory Visit: Payer: 59 | Admitting: Physical Therapy

## 2020-01-21 ENCOUNTER — Ambulatory Visit: Payer: 59

## 2020-01-21 ENCOUNTER — Other Ambulatory Visit: Payer: Self-pay

## 2020-01-21 DIAGNOSIS — M6281 Muscle weakness (generalized): Secondary | ICD-10-CM

## 2020-01-21 DIAGNOSIS — R2689 Other abnormalities of gait and mobility: Secondary | ICD-10-CM

## 2020-01-21 DIAGNOSIS — M25552 Pain in left hip: Secondary | ICD-10-CM

## 2020-01-21 DIAGNOSIS — R262 Difficulty in walking, not elsewhere classified: Secondary | ICD-10-CM

## 2020-01-21 DIAGNOSIS — M25652 Stiffness of left hip, not elsewhere classified: Secondary | ICD-10-CM | POA: Diagnosis not present

## 2020-01-21 NOTE — Therapy (Signed)
[facility] [address]  [place], [place], [address] Phone: [phone]   Fax:  [phone]  Physical Therapy Treatment  Patient Details  Name: Timothy Mcintosh MRN: [id number] Date of Birth: [birth year] Referring Provider (PT): Timothy Leitz, MD   Encounter Date: 01/21/2020  PT End of Session - 01/21/20 0943    Visit Number  5    Number of Visits  12    Date for PT Re-Evaluation  02/13/20    Authorization Type  [employer] - prior auth required    PT Start Time  0933    PT Stop Time  1030    PT Time Calculation (min)  57 min    Activity Tolerance  Patient tolerated treatment well    Behavior During Therapy  [facility] for tasks assessed/performed       Past Medical History:  Diagnosis Date  . Family history of breast cancer   . Family history of genetic disease carrier    sister PALB2+  . Family history of lung cancer   . Family history of throat cancer   . Headache   . Hyperlipidemia   . Hypertension   . Rheumatoid arthritis ([place])   . Vision abnormalities     Past Surgical History:  Procedure Laterality Date  . BACK SURGERY Bilateral    c5 -c6  . CARPAL TUNNEL RELEASE Right   . KNEE SURGERY Bilateral   . TOTAL HIP ARTHROPLASTY Left 12/14/2019   Procedure: TOTAL HIP ARTHROPLASTY ANTERIOR APPROACH;  Surgeon: Timothy Leitz, MD;  Location: [facility];  Service: Orthopedics;  Laterality: Left;    There were no vitals filed for this visit.  Subjective Assessment - 01/21/20 0940    Subjective  Pt. doing well.    Pertinent History  L THA 12/14/19 (anterior approach), neck surgery, lumbar DDD, B knee surgery    Patient Stated Goals  "to get back to walking and recovery from surgery w/o developing bad habits"    Currently in Pain?  No/denies    Pain Score  0-No pain    Multiple Pain Sites  No         [facility] PT Assessment - 01/21/20 0001      AROM   Overall AROM Comments  L hip limited in AROM/PROM IR, ER,  flexion, ext      Strength   Strength Assessment Site  Hip;Knee    Right/Left Hip  Right;Left    Right Hip Flexion  4+/5    Right Hip Extension  4/5    Right Hip External Rotation   4/5    Right Hip Internal Rotation  4+/5    Right Hip ABduction  4+/5    Right Hip ADduction  4+/5    Left Hip Flexion  4/5    Left Hip Extension  3+/5    Left Hip External Rotation  4-/5    Left Hip Internal Rotation  4-/5    Left Hip ABduction  3+/5    Left Hip ADduction  3+/5    Right/Left Knee  Right;Left    Right Knee Flexion  4+/5    Right Knee Extension  5/5    Left Knee Flexion  4+/5    Left Knee Extension  4+/5                   [facility] Adult PT Treatment/Exercise - 01/21/20 0001      Ambulation/Gait   Ambulation/Gait  Yes  Ambulation/Gait Assistance  5: Supervision    Ambulation/Gait Assistance Details  working on even step length, increased B arm swing, and even weight shift with and without AD trials of 90 ft     Ambulation Distance (Feet)  180 Feet   2 x 90 ft trials      Knee/Hip Exercises: Stretches   Passive Hamstring Stretch  Left;30 seconds;2 reps    Passive Hamstring Stretch Limitations  supine with strap    Hip Flexor Stretch  Left;30 seconds;1 rep    Hip Flexor Stretch Limitations  mod thomas pos with LE elevated on step    Piriformis Stretch  Left;1 rep;30 seconds    Piriformis Stretch Limitations  gentle - KTOS with strap      Knee/Hip Exercises: Aerobic   Nustep  Lvl 5, 6 min (LE only)       Knee/Hip Exercises: Supine   Bridges with Ball Squeeze  Both;10 reps;Strengthening    Bridges with Clamshell  Both;10 reps;Strengthening   B hip isometric hip abd/ER into green TB at knees    Other Supine Knee/Hip Exercises  Hooklying alternating clam shell with green TB at knees x 10 reps   green TB as noted red TB too easy      Knee/Hip Exercises: Sidelying   Clams  B clam shell with yellow TB x 10 reps              PT Education - 01/21/20 1049     Education Details  HEP update;  bridge/abd (green TB at knees), sidelying clam shell (yellow TB), Bridge/adduction squeeze    Person(s) Educated  Patient    Methods  Explanation;Demonstration;Verbal cues;Handout    Comprehension  Verbalized understanding;Returned demonstration;Verbal cues required       PT Short Term Goals - 01/21/20 0951      PT SHORT TERM GOAL #1   Title  Patient will be independent with initial HEP    Status  Achieved   01/21/20   Target Date  01/23/20      PT SHORT TERM GOAL #2   Title  Patient will tolerate >/= neutral hip extension to improve tolerance for sleeping and supine exercises    Status  Achieved   01/21/20   Target Date  01/23/20        PT Long Term Goals - 01/21/20 0956      PT LONG TERM GOAL #1   Title  Patient will be independent with ongoing/advanced HEP    Status  Partially Met      PT LONG TERM GOAL #2   Title  Patient to improve L hip AROM to [facility] without pain provocation    Status  On-going      PT LONG TERM GOAL #3   Title  Patient will demonstrate improved L hip and knee strength to >/= 4/5 to 4+/5 for improved stability and activity tolerance    Status  Partially Met   01/21/20 - met for L knee ext., flexion 4+/5 strength, L hip flex 4/5 strength with MMT     PT LONG TERM GOAL #4   Title  Patient will ambulate with normal gait pattern w/o need for AD    Status  Partially Met   01/21/20: ambulation mechanics improving with SPC and with no AD     PT LONG TERM GOAL #5   Title  Patient to report ability to perform ADLs, household and work-related tasks without increased pain    Status  Partially Met  01/21/20: Pt. reporting he was able to get on his boat on Saturday and felt comfortable.  Denies difficulty with work-related or household tasks.  Still having to sock aid for donning socks.           Plan - 01/21/20 0953    Clinical Impression Statement  Timothy Mcintosh has made good progress with physical therapy.  Pt. denies  questions regarding initial HEP.  STG #1 met.  Notes he can sleep in full supine positioning (full hip extension) without pain and sleeping through night for about a week without waking with pain.  STG #2 met.  All STGs met.  LTG #2 still ongoing as pt. still limited in all PROM/AROM at L hip however is demonstrating improvement in hip extension and ER tolerance.  Pt. able to demo improved L hip and knee strength with MMT now meeting goal for L knee flexion/extension strength 4+/5 and L hip flexion strength 4/5 strength with MMT. Progressed mat-level proximal hip strengthening activities today to target glute and rotational strength at L hip where pt. is still demonstrating most strength deficits.  HEP updated.  LTG #3 partially met.  Pt. able to demo improved gait mechanics with improved L step-length and weight shift now with and without AD.  Still demonstrating limited B arm swing and stride length.  LTG #4 partially achieved.  Pt. reporting no longer having difficulty with work and household related tasks and was able to go out on his boat on Saturday and felt comfortable.  Does still have to use sock aid device to don socks and would like to be able to don socks without device eventually.  LTG #5 partially achieved.  Will plan next visit to monitor tolerance to updated HEP and continue to progress functional strength and mobility.    Comorbidities  Asthmatic bronchitis, GERD, OA - L hip and knee, R meniscal tear, B knee surgery, LBP/lumbar DDD, neck surgery, Gout    Rehab Potential  Good    PT Treatment/Interventions  ADLs/Self Care Home Management;Cryotherapy;Electrical Stimulation;Iontophoresis '4mg'$ /ml Dexamethasone;Moist Heat;DME Instruction;Gait training;Stair training;Functional mobility training;Therapeutic activities;Therapeutic exercise;Balance training;Neuromuscular re-education;Patient/family education;Manual techniques;Scar mobilization;Passive range of motion;Dry needling;Taping    PT Next Visit  Plan  L hip ROM, Core and LE strengthening; Gait training for symmetrical step-through pattern working toward weaning to no AD; Manual therapy and modalities PRN for pain and abnormal muscle tension    PT Home Exercise Plan  HH HEP; seated march, standing hip abduction;  01/07/20 -standing HS curl, HS stretch, R/L standing weight shift, standing hip abduction; 01/21/20 -HEP update;  bridge/abd (green TB at knees), sidelying clam shell (yellow TB), Bridge/adduction squeeze    Consulted and Agree with Plan of Care  Patient       Patient will benefit from skilled therapeutic intervention in order to improve the following deficits and impairments:  Abnormal gait, Decreased activity tolerance, Decreased balance, Decreased endurance, Decreased knowledge of precautions, Decreased knowledge of use of DME, Decreased mobility, Decreased range of motion, Decreased safety awareness, Decreased scar mobility, Decreased strength, Difficulty walking, Hypomobility, Increased edema, Increased fascial restricitons, Increased muscle spasms, Impaired perceived functional ability, Impaired flexibility, Impaired sensation, Improper body mechanics, Postural dysfunction, Pain  Visit Diagnosis: Stiffness of left hip, not elsewhere classified  Pain in left hip  Other abnormalities of gait and mobility  Muscle weakness (generalized)  Difficulty in walking, not elsewhere classified     Problem List Patient Active Problem List   Diagnosis Date Noted  . Status post total hip replacement,  left 12/14/2019  . Degenerative tear of meniscus of right knee 09/04/2019  . Primary osteoarthritis of left hip 09/04/2019  . Left lower quadrant abdominal pain 09/04/2019  . Monoallelic mutation of PALB2 gene 05/31/2018  . Genetic testing 05/31/2018  . Hypertension 05/16/2018  . Left elbow pain 05/16/2018  . Family history of genetic disease carrier   . Family history of breast cancer   . Family history of lung cancer   .  Family history of throat cancer   . Post concussion syndrome 09/15/2017  . Restless leg syndrome 09/15/2017  . Rheumatoid arthritis ([place]) 04/01/2016  . Dyslipidemia 12/25/2015  . Tobacco use disorder 12/25/2015  . Insomnia 12/25/2015  . Primary osteoarthritis of left knee 07/30/2015  . GERD (gastroesophageal reflux disease) 03/04/2014  . Major depression 03/04/2014  . Asthmatic bronchitis 09/19/2013  . ED (erectile dysfunction) 04/19/2013  . Dorsalgia 11/21/2011  . Hyperlipidemia 11/21/2011  . Gout 11/21/2011  . GAD (generalized anxiety disorder) 11/21/2011   Bess Harvest, PTA 01/21/20 10:56 AM     [facility] [address]  [place], [place], [address] Phone: [phone]   Fax:  [phone]  Name: Timothy Mcintosh MRN: [id number] Date of Birth: October 31, 1966

## 2020-01-24 ENCOUNTER — Ambulatory Visit: Payer: 59

## 2020-01-24 ENCOUNTER — Other Ambulatory Visit: Payer: Self-pay

## 2020-01-24 DIAGNOSIS — R262 Difficulty in walking, not elsewhere classified: Secondary | ICD-10-CM

## 2020-01-24 DIAGNOSIS — M6281 Muscle weakness (generalized): Secondary | ICD-10-CM

## 2020-01-24 DIAGNOSIS — R2689 Other abnormalities of gait and mobility: Secondary | ICD-10-CM

## 2020-01-24 DIAGNOSIS — M25652 Stiffness of left hip, not elsewhere classified: Secondary | ICD-10-CM | POA: Diagnosis not present

## 2020-01-24 DIAGNOSIS — M25552 Pain in left hip: Secondary | ICD-10-CM

## 2020-01-24 NOTE — Therapy (Signed)
[facility] [address]  [place], [place], [address] Phone: [phone]   Fax:  [phone]  Physical Therapy Treatment  Patient Details  Name: Timothy Mcintosh MRN: [id number] Date of Birth: 01/07/1966 Referring Provider (PT): Dorna Leitz, MD   Encounter Date: 01/24/2020  PT End of Session - 01/24/20 1447    Visit Number  6    Number of Visits  12    Date for PT Re-Evaluation  02/13/20    Authorization Type  [employer] - prior auth required    PT Start Time  1441    PT Stop Time  1525    PT Time Calculation (min)  44 min    Activity Tolerance  Patient tolerated treatment well    Behavior During Therapy  [facility] for tasks assessed/performed       Past Medical History:  Diagnosis Date  . Family history of breast cancer   . Family history of genetic disease carrier    sister PALB2+  . Family history of lung cancer   . Family history of throat cancer   . Headache   . Hyperlipidemia   . Hypertension   . Rheumatoid arthritis ([place])   . Vision abnormalities     Past Surgical History:  Procedure Laterality Date  . BACK SURGERY Bilateral    c5 -c6  . CARPAL TUNNEL RELEASE Right   . KNEE SURGERY Bilateral   . TOTAL HIP ARTHROPLASTY Left 12/14/2019   Procedure: TOTAL HIP ARTHROPLASTY ANTERIOR APPROACH;  Surgeon: Dorna Leitz, MD;  Location: [facility];  Service: Orthopedics;  Laterality: Left;    There were no vitals filed for this visit.  Subjective Assessment - 01/24/20 1444    Subjective  Pt. reporting he had the MD office call him and reschedule his f/u for later today.    Pertinent History  L THA 12/14/19 (anterior approach), neck surgery, lumbar DDD, B knee surgery    Patient Stated Goals  "to get back to walking and recovery from surgery w/o developing bad habits"    Currently in Pain?  No/denies    Pain Score  0-No pain    Pain Location  Hip    Pain Orientation  Left;Posterior    Multiple Pain Sites  No                        [facility] Adult PT Treatment/Exercise - 01/24/20 0001      Ambulation/Gait   Ambulation/Gait  Yes    Ambulation/Gait Assistance  5: Supervision    Ambulation/Gait Assistance Details  Working on increased L hip/knee flexion with SPC and even weight shift B    Ambulation Distance (Feet)  250 Feet    Pre-Gait Activities  Walking high knee march for increased hip/knee flexion with gait with [facility]      Self-Care   Self-Care  Other Self-Care Comments    Other Self-Care Comments   Education on self-massage with rolling pin to L ITB/quad       Knee/Hip Exercises: Stretches   Passive Hamstring Stretch  Left;30 seconds;2 reps    Passive Hamstring Stretch Limitations  supine with strap    Hip Flexor Stretch  Left;30 seconds;1 rep    Hip Flexor Stretch Limitations  mod thomas pos with LE elevated on step    ITB Stretch  Left;30 seconds;2 reps    ITB Stretch Limitations  supine with strap     Piriformis Stretch  Left;30 seconds;2 reps    Piriformis Stretch Limitations  seated - gentle - very limited ROM     Other Knee/Hip Stretches  Instructed pt. in sitting L hip flexor stretch on chair however pt. prefers mod thomas supine with strap       Knee/Hip Exercises: Aerobic   Nustep  Lvl 5, 6 min (LE only)       Knee/Hip Exercises: Standing   Hip Flexion  Right;Left;10 reps;Knee straight;Stengthening    Hip Flexion Limitations  yellow looped TB at ankles; counter support     Hip Abduction  Right;Left;10 reps;Knee straight;Stengthening    Abduction Limitations  yellow looped TB at ankles; counter     Hip Extension  Right;Left;10 reps;Knee straight;Stengthening    Extension Limitations  yellow looped TB at ankles; counter support     Functional Squat  10 reps;3 seconds    Functional Squat Limitations  TRX      Knee/Hip Exercises: Supine   Bridges with Clamshell  Both;1 set;Strengthening   x 12 reps; isometric hip abd/ER into red TB      Knee/Hip Exercises:  Sidelying   Clams  B clam shell with TB x 10 reps       Manual Therapy   Manual Therapy  Soft tissue mobilization;Myofascial release    Manual therapy comments  R sidelying with L LE resting on bolster     Soft tissue mobilization  STM/DTM to scar, TFL, ITB    Myofascial Release  TPr to L TFL               PT Short Term Goals - 01/21/20 0951      PT SHORT TERM GOAL #1   Title  Patient will be independent with initial HEP    Status  Achieved   01/21/20   Target Date  01/23/20      PT SHORT TERM GOAL #2   Title  Patient will tolerate >/= neutral hip extension to improve tolerance for sleeping and supine exercises    Status  Achieved   01/21/20   Target Date  01/23/20        PT Long Term Goals - 01/21/20 0956      PT LONG TERM GOAL #1   Title  Patient will be independent with ongoing/advanced HEP    Status  Partially Met      PT LONG TERM GOAL #2   Title  Patient to improve L hip AROM to [facility] without pain provocation    Status  On-going      PT LONG TERM GOAL #3   Title  Patient will demonstrate improved L hip and knee strength to >/= 4/5 to 4+/5 for improved stability and activity tolerance    Status  Partially Met   01/21/20 - met for L knee ext., flexion 4+/5 strength, L hip flex 4/5 strength with MMT     PT LONG TERM GOAL #4   Title  Patient will ambulate with normal gait pattern w/o need for AD    Status  Partially Met   01/21/20: ambulation mechanics improving with SPC and with no AD     PT LONG TERM GOAL #5   Title  Patient to report ability to perform ADLs, household and work-related tasks without increased pain    Status  Partially Met   01/21/20: Pt. reporting he was able to get on his boat on Saturday and felt comfortable.  Denies difficulty with work-related or household tasks.  Still having to  sock aid for donning socks.           Plan - 01/24/20 1448    Clinical Impression Statement  Timothy Mcintosh continues to report improving L hip flexor  stretching tolerance at home and seems to continue improving gait mechanics with SPC.  Did present with some L circumduction and reduce hip/knee flexion with SPC initially which improved after gait training.  Tolerated progression to 3-way standing hip kicker with yellow looped TB at ankles and TRX squat without pain.  Still remains ttp with MT to L incision and L anterior/lateral hip musculature.  Encouraged pt. to continue self-massage at home with rolling pin and continue daily stretching and strengthening HEP.  Ended visit with pt. reporting he was pain free.    Comorbidities  Asthmatic bronchitis, GERD, OA - L hip and knee, R meniscal tear, B knee surgery, LBP/lumbar DDD, neck surgery, Gout    Rehab Potential  Good    PT Treatment/Interventions  ADLs/Self Care Home Management;Cryotherapy;Electrical Stimulation;Iontophoresis 80m/ml Dexamethasone;Moist Heat;DME Instruction;Gait training;Stair training;Functional mobility training;Therapeutic activities;Therapeutic exercise;Balance training;Neuromuscular re-education;Patient/family education;Manual techniques;Scar mobilization;Passive range of motion;Dry needling;Taping    PT Home Exercise Plan  HH HEP; seated march, standing hip abduction;  01/07/20 -standing HS curl, HS stretch, R/L standing weight shift, standing hip abduction; 01/21/20 -HEP update;  bridge/abd (green TB at knees), sidelying clam shell (yellow TB), Bridge/adduction squeeze    Consulted and Agree with Plan of Care  Patient       Patient will benefit from skilled therapeutic intervention in order to improve the following deficits and impairments:  Abnormal gait, Decreased activity tolerance, Decreased balance, Decreased endurance, Decreased knowledge of precautions, Decreased knowledge of use of DME, Decreased mobility, Decreased range of motion, Decreased safety awareness, Decreased scar mobility, Decreased strength, Difficulty walking, Hypomobility, Increased edema, Increased fascial  restricitons, Increased muscle spasms, Impaired perceived functional ability, Impaired flexibility, Impaired sensation, Improper body mechanics, Postural dysfunction, Pain  Visit Diagnosis: Stiffness of left hip, not elsewhere classified  Pain in left hip  Other abnormalities of gait and mobility  Muscle weakness (generalized)  Difficulty in walking, not elsewhere classified     Problem List Patient Active Problem List   Diagnosis Date Noted  . Status post total hip replacement, left 12/14/2019  . Degenerative tear of meniscus of right knee 09/04/2019  . Primary osteoarthritis of left hip 09/04/2019  . Left lower quadrant abdominal pain 09/04/2019  . Monoallelic mutation of PALB2 gene 05/31/2018  . Genetic testing 05/31/2018  . Hypertension 05/16/2018  . Left elbow pain 05/16/2018  . Family history of genetic disease carrier   . Family history of breast cancer   . Family history of lung cancer   . Family history of throat cancer   . Post concussion syndrome 09/15/2017  . Restless leg syndrome 09/15/2017  . [place] 04/01[place]2016  . Dyslipidemia 12/25/2015  . Tobacco use disorder 12/25/2015  . Insomnia 12/25/2015  . Primary osteoarthritis of left knee 07/30/2015  . GERD (gastroesophageal reflux disease) 03/04/2014  . Major depression 03/04/2014  . Asthmatic bronchitis 09/19/2013  . ED (erectile dysfunction) 04/19/2013  . Dorsalgia 11/21/2011  . Hyperlipidemia 11/21/2011  . Gout 11/21/2011  . GAD (generalized anxiety disorder) 11/21/2011    MBess Harvest PTA 01/24/20 6:21 PM   [facility] [address] [place] [address]: [phone]  Fax:  [phone] Name: Timothy BUNNMRN: [id number] of Birth: 409-26-1967

## 2020-01-28 ENCOUNTER — Encounter: Payer: Self-pay | Admitting: Physical Therapy

## 2020-01-28 ENCOUNTER — Ambulatory Visit: Payer: 59 | Admitting: Physical Therapy

## 2020-01-28 ENCOUNTER — Other Ambulatory Visit: Payer: Self-pay

## 2020-01-28 DIAGNOSIS — M25652 Stiffness of left hip, not elsewhere classified: Secondary | ICD-10-CM

## 2020-01-28 DIAGNOSIS — R262 Difficulty in walking, not elsewhere classified: Secondary | ICD-10-CM

## 2020-01-28 DIAGNOSIS — M6281 Muscle weakness (generalized): Secondary | ICD-10-CM

## 2020-01-28 DIAGNOSIS — R2689 Other abnormalities of gait and mobility: Secondary | ICD-10-CM

## 2020-01-28 DIAGNOSIS — M25552 Pain in left hip: Secondary | ICD-10-CM

## 2020-01-28 NOTE — Therapy (Signed)
[facility] [address]  [place], [place], [address] Phone: [phone]   Fax:  [phone]  Physical Therapy Treatment  Patient Details  Name: Timothy Mcintosh MRN: [id number] Date of Birth: [birth year] Referring Provider (PT): Dorna Leitz, MD   Encounter Date: 01/28/2020  PT End of Session - 01/28/20 0932    Visit Number  7    Number of Visits  12    Date for PT Re-Evaluation  02/13/20    Authorization Type  [employer] - prior auth required    PT Start Time  0932    PT Stop Time  1002   Pt requesting to leave early for another appointment   PT Time Calculation (min)  30 min    Activity Tolerance  Patient tolerated treatment well    Behavior During Therapy  [facility] for tasks assessed/performed       Past Medical History:  Diagnosis Date  . Family history of breast cancer   . Family history of genetic disease carrier    sister PALB2+  . Family history of lung cancer   . Family history of throat cancer   . Headache   . Hyperlipidemia   . Hypertension   . Rheumatoid arthritis ([place])   . Vision abnormalities     Past Surgical History:  Procedure Laterality Date  . BACK SURGERY Bilateral    c5 -c6  . CARPAL TUNNEL RELEASE Right   . KNEE SURGERY Bilateral   . TOTAL HIP ARTHROPLASTY Left 12/14/2019   Procedure: TOTAL HIP ARTHROPLASTY ANTERIOR APPROACH;  Surgeon: Dorna Leitz, MD;  Location: [facility];  Service: Orthopedics;  Laterality: Left;    There were no vitals filed for this visit.  Subjective Assessment - 01/28/20 0937    Subjective  Pt reports MD feels like he is progressing well post-op - will f/u again in 6 weeks. States he will need to leave 15 min early today due to another commitment.    Pertinent History  L THA 12/14/19 (anterior approach), neck surgery, lumbar DDD, B knee surgery    Patient Stated Goals  "to get back to walking and recovery from surgery w/o developing bad habits"    Currently in  Pain?  No/denies         [facility] PT Assessment - 01/28/20 0932      Assessment   Medical Diagnosis  L THA    Referring Provider (PT)  Dorna Leitz, MD    Onset Date/Surgical Date  12/14/19    Next MD Visit  03/06/20                   [facility] Adult PT Treatment/Exercise - 01/28/20 0932      Exercises   Exercises  Knee/Hip      Knee/Hip Exercises: Stretches   Hip Flexor Stretch  Left;30 seconds;1 rep    Hip Flexor Stretch Limitations  L seated lunge position      Knee/Hip Exercises: Aerobic   Recumbent Bike  L2 x 6 min      Knee/Hip Exercises: Standing   Knee Flexion  Right;Left;10 reps;Strengthening    Knee Flexion Limitations  looped yellow at feet/ankles    Hip Flexion  Right;Left;10 reps;Knee straight;Stengthening    Hip Flexion Limitations  red TB at ankles; counter support for balance    Forward Lunges  Right;Left;10 reps;3 seconds    Forward Lunges Limitations  single UE support on counter    Hip Abduction  Right;Left;10 reps;Knee straight;Stengthening    Abduction Limitations  red TB at ankles - cues to avoid hip ER; counter support for balance    Hip Extension  Right;Left;10 reps;Knee straight;Stengthening    Extension Limitations  red TB at ankles; counter support for balance    Functional Squat  15 reps;3 seconds    Functional Squat Limitations  TRX - triple extension    Other Standing Knee Exercises  B lateral band walk and fwd/back monster walk with looped red TB 2 x 10 ft each, intermittent UE support on counter      Knee/Hip Exercises: Sidelying   Clams  B clam with red TB x 15             PT Education - 01/28/20 1002    Education Details  HEP update - standing red TB 3-way SLR (pt declined handout), clam progressed to red TB    Person(s) Educated  Patient    Methods  Explanation;Demonstration    Comprehension  Verbalized understanding;Returned demonstration       PT Short Term Goals - 01/28/20 0939      PT SHORT TERM GOAL #1   Title   Patient will be independent with initial HEP    Status  Achieved   01/21/20     PT SHORT TERM GOAL #2   Title  Patient will tolerate >/= neutral hip extension to improve tolerance for sleeping and supine exercises    Status  Achieved   01/21/20       PT Long Term Goals - 01/21/20 0956      PT LONG TERM GOAL #1   Title  Patient will be independent with ongoing/advanced HEP    Status  Partially Met      PT LONG TERM GOAL #2   Title  Patient to improve L hip AROM to [facility] without pain provocation    Status  On-going      PT LONG TERM GOAL #3   Title  Patient will demonstrate improved L hip and knee strength to >/= 4/5 to 4+/5 for improved stability and activity tolerance    Status  Partially Met   01/21/20 - met for L knee ext., flexion 4+/5 strength, L hip flex 4/5 strength with MMT     PT LONG TERM GOAL #4   Title  Patient will ambulate with normal gait pattern w/o need for AD    Status  Partially Met   01/21/20: ambulation mechanics improving with SPC and with no AD     PT LONG TERM GOAL #5   Title  Patient to report ability to perform ADLs, household and work-related tasks without increased pain    Status  Partially Met   01/21/20: Pt. reporting he was able to get on his boat on Saturday and felt comfortable.  Denies difficulty with work-related or household tasks.  Still having to sock aid for donning socks.           Plan - 01/28/20 0939    Clinical Impression Statement  Timothy Mcintosh reports he feels more flexible (maybe even more than he was prior to surgery) and MD pleased with his progress. He is able to tolerate increased resistance to red TB with several exercises including side lying clams and standing 3-way hip SLR, therefore red TB provided for home use. Introduced lunges and lateral and monster band walks to encourage dynamic strengthening with good tolerance. Session time limited due to patient request to leave early for another commitment.  Comorbidities  Asthmatic  bronchitis, GERD, OA - L hip and knee, R meniscal tear, B knee surgery, LBP/lumbar DDD, neck surgery, Gout    Rehab Potential  Good    PT Frequency  2x / week    PT Duration  6 weeks    PT Treatment/Interventions  ADLs/Self Care Home Management;Cryotherapy;Electrical Stimulation;Iontophoresis 74m/ml Dexamethasone;Moist Heat;DME Instruction;Gait training;Stair training;Functional mobility training;Therapeutic activities;Therapeutic exercise;Balance training;Neuromuscular re-education;Patient/family education;Manual techniques;Scar mobilization;Passive range of motion;Dry needling;Taping    PT Next Visit Plan  L hip ROM, Core and LE strengthening; Gait training for symmetrical step-through pattern working toward weaning to no AD; Manual therapy and modalities PRN for pain and abnormal muscle tension    PT Home Exercise Plan  HH HEP; seated march, standing hip abduction;  01/07/20 - standing HS curl, HS stretch, R/L standing weight shift, standing hip abduction; 01/21/20 -  bridge/abd (green TB at knees), sidelying clam shell (yellow TB - progressed to red TB 01/28/20), Bridge/adduction squeeze; 01/28/20 - standing red TB 3-way SLR    Consulted and Agree with Plan of Care  Patient       Patient will benefit from skilled therapeutic intervention in order to improve the following deficits and impairments:  Abnormal gait, Decreased activity tolerance, Decreased balance, Decreased endurance, Decreased knowledge of precautions, Decreased knowledge of use of DME, Decreased mobility, Decreased range of motion, Decreased safety awareness, Decreased scar mobility, Decreased strength, Difficulty walking, Hypomobility, Increased edema, Increased fascial restricitons, Increased muscle spasms, Impaired perceived functional ability, Impaired flexibility, Impaired sensation, Improper body mechanics, Postural dysfunction, Pain  Visit Diagnosis: Stiffness of left hip, not elsewhere classified  Pain in left hip  Other  abnormalities of gait and mobility  Muscle weakness (generalized)  Difficulty in walking, not elsewhere classified     Problem List Patient Active Problem List   Diagnosis Date Noted  . Status post total hip replacement, left 12/14/2019  . Degenerative tear of meniscus of right knee 09/04/2019  . Primary osteoarthritis of left hip 09/04/2019  . Left lower quadrant abdominal pain 09/04/2019  . Monoallelic mutation of PALB2 gene 05/31/2018  . Genetic testing 05/31/2018  . Hypertension 05/16/2018  . Left elbow pain 05/16/2018  . Family history of genetic disease carrier   . Family history of breast cancer   . Family history of lung cancer   . Family history of throat cancer   . Post concussion syndrome 09/15/2017  . Restless leg syndrome 09/15/2017  . Rheumatoid arthritis ([place] 04/01/2016  . Dyslipidemia 12/25/2015  . Tobacco use disorder 12/25/2015  . Insomnia 12/25/2015  . Primary osteoarthritis of left knee 07/30/2015  . GERD (gastroesophageal reflux disease) 03/04/2014  . Major depression 03/04/2014  . Asthmatic bronchitis 09/19/2013  . ED (erectile dysfunction) 04/19/2013  . Dorsalgia 11/21/2011  . Hyperlipidemia 11/21/2011  . Gout 11/21/2011  . GAD (generalized anxiety disorder) 11/21/2011    JPercival Spanish PT, MPT 01/28/2020, 10:14 AM  [facility] [place] [address]Phone: [phone]  Fax:  [phone] Name: Timothy LUBECKMRN: [id number] of Birth: [birth year]

## 2020-01-30 ENCOUNTER — Encounter: Payer: Self-pay | Admitting: Family Medicine

## 2020-01-31 ENCOUNTER — Other Ambulatory Visit: Payer: Self-pay

## 2020-01-31 ENCOUNTER — Ambulatory Visit: Payer: 59 | Attending: Orthopedic Surgery

## 2020-01-31 DIAGNOSIS — R262 Difficulty in walking, not elsewhere classified: Secondary | ICD-10-CM | POA: Insufficient documentation

## 2020-01-31 DIAGNOSIS — M25552 Pain in left hip: Secondary | ICD-10-CM | POA: Diagnosis present

## 2020-01-31 DIAGNOSIS — R2689 Other abnormalities of gait and mobility: Secondary | ICD-10-CM | POA: Diagnosis present

## 2020-01-31 DIAGNOSIS — M6281 Muscle weakness (generalized): Secondary | ICD-10-CM | POA: Insufficient documentation

## 2020-01-31 DIAGNOSIS — M25652 Stiffness of left hip, not elsewhere classified: Secondary | ICD-10-CM | POA: Diagnosis not present

## 2020-01-31 NOTE — Therapy (Signed)
[place] [facility] [address]  [place], [place], [address] Phone: [phone]   Fax:  [phone]  Physical Therapy Treatment  Patient Details  Name: Timothy Mcintosh MRN: [id number] Date of Birth: 1966/03/10 Referring Provider (PT): Dorna Leitz, MD   Encounter Date: 01/31/2020  PT End of Session - 01/31/20 1451    Visit Number  8    Number of Visits  12    Date for PT Re-Evaluation  02/13/20    Authorization Type  [employer] - prior auth required    PT Start Time  6962    PT Stop Time  1530    PT Time Calculation (min)  43 min    Activity Tolerance  Patient tolerated treatment well    Behavior During Therapy  [facility] for tasks assessed/performed       Past Medical History:  Diagnosis Date  . Family history of breast cancer   . Family history of genetic disease carrier    sister PALB2+  . Family history of lung cancer   . Family history of throat cancer   . Headache   . Hyperlipidemia   . Hypertension   . Rheumatoid arthritis ([place])   . Vision abnormalities     Past Surgical History:  Procedure Laterality Date  . BACK SURGERY Bilateral    c5 -c6  . CARPAL TUNNEL RELEASE Right   . KNEE SURGERY Bilateral   . TOTAL HIP ARTHROPLASTY Left 12/14/2019   Procedure: TOTAL HIP ARTHROPLASTY ANTERIOR APPROACH;  Surgeon: Dorna Leitz, MD;  Location: [facility];  Service: Orthopedics;  Laterality: Left;    There were no vitals filed for this visit.  Subjective Assessment - 01/31/20 1450    Subjective  Doing well.    Pertinent History  L THA 12/14/19 (anterior approach), neck surgery, lumbar DDD, B knee surgery    Patient Stated Goals  "to get back to walking and recovery from surgery w/o developing bad habits"    Currently in Pain?  No/denies    Pain Score  0-No pain    Pain Location  Hip    Pain Orientation  Left    Multiple Pain Sites  Yes    Pain Score  5    Pain Location  Knee    Pain Orientation   Left;Anterior;Lateral    Pain Descriptors / Indicators  Sharp    Pain Type  Acute pain    Pain Onset  More than a month ago    Pain Frequency  Intermittent    Aggravating Factors   walking    Pain Relieving Factors  rest                       [facility] Adult PT Treatment/Exercise - 01/31/20 0001      Knee/Hip Exercises: Stretches   Passive Hamstring Stretch  Left;30 seconds;1 rep    Passive Hamstring Stretch Limitations  supine with strap    Hip Flexor Stretch  Left;30 seconds;1 rep    Hip Flexor Stretch Limitations  mod thomas lunge     ITB Stretch  Left;30 seconds;1 rep    ITB Stretch Limitations  supine with strap     Piriformis Stretch  Left;30 seconds;1 rep    Piriformis Stretch Limitations  KTOS     Gastroc Stretch  Left;1 rep;30 seconds    Gastroc Stretch Limitations  into counter     Other Knee/Hip Stretches  L Figure-4 stretch 2  x 30 sec       Knee/Hip Exercises: Standing   Forward Lunges  Right;Left;10 reps;3 seconds    Forward Lunges Limitations  TM rail support       Knee/Hip Exercises: Supine   Bridges  Both;10 reps;Strengthening    Bridges Limitations  5" hold     Straight Leg Raises  Left;10 reps;AROM;Strengthening    Straight Leg Raises Limitations  cues for TKE       Knee/Hip Exercises: Sidelying   Hip ABduction  Left;10 reps;AROM;Strengthening    Hip ABduction Limitations  cues for proper motion     Clams  B clam with red TB x 15               PT Short Term Goals - 01/28/20 0939      PT SHORT TERM GOAL #1   Title  Patient will be independent with initial HEP    Status  Achieved   01/21/20     PT SHORT TERM GOAL #2   Title  Patient will tolerate >/= neutral hip extension to improve tolerance for sleeping and supine exercises    Status  Achieved   01/21/20       PT Long Term Goals - 01/21/20 0956      PT LONG TERM GOAL #1   Title  Patient will be independent with ongoing/advanced HEP    Status  Partially Met      PT LONG  TERM GOAL #2   Title  Patient to improve L hip AROM to [facility] without pain provocation    Status  On-going      PT LONG TERM GOAL #3   Title  Patient will demonstrate improved L hip and knee strength to >/= 4/5 to 4+/5 for improved stability and activity tolerance    Status  Partially Met   01/21/20 - met for L knee ext., flexion 4+/5 strength, L hip flex 4/5 strength with MMT     PT LONG TERM GOAL #4   Title  Patient will ambulate with normal gait pattern w/o need for AD    Status  Partially Met   01/21/20: ambulation mechanics improving with SPC and with no AD     PT LONG TERM GOAL #5   Title  Patient to report ability to perform ADLs, household and work-related tasks without increased pain    Status  Partially Met   01/21/20: Pt. reporting he was able to get on his boat on Saturday and felt comfortable.  Denies difficulty with work-related or household tasks.  Still having to sock aid for donning socks.           Plan - 01/31/20 1503    Clinical Impression Statement  Timothy Mcintosh reporting improved awareness of upright posture and even weight shift during short trials of ambulation without SPC.  Able to demo improved gait mechanics without SPC in session today.  Still instructed pt. to use SPC at times with longer community distances.  L LE stretching activities progressed today and encouraged pt. to continue focusing on LE flexibility and strengthening activities at home with HEP.  Ended visit with pt. noting LE fatigue however pain free.    Comorbidities  Asthmatic bronchitis, GERD, OA - L hip and knee, R meniscal tear, B knee surgery, LBP/lumbar DDD, neck surgery, Gout    Rehab Potential  Good    PT Treatment/Interventions  ADLs/Self Care Home Management;Cryotherapy;Electrical Stimulation;Iontophoresis 40m/ml Dexamethasone;Moist Heat;DME Instruction;Gait training;Stair training;Functional mobility training;Therapeutic activities;Therapeutic exercise;Balance training;Neuromuscular  re-education;Patient/family education;Manual techniques;Scar mobilization;Passive range of motion;Dry needling;Taping    PT Next Visit Plan  L hip ROM, Core and LE strengthening; Gait training for symmetrical step-through pattern working toward weaning to no AD; Manual therapy and modalities PRN for pain and abnormal muscle tension    PT Home Exercise Plan  HH HEP; seated march, standing hip abduction;  01/07/20 - standing HS curl, HS stretch, R/L standing weight shift, standing hip abduction; 01/21/20 -  bridge/abd (green TB at knees), sidelying clam shell (yellow TB - progressed to red TB 01/28/20), Bridge/adduction squeeze; 01/28/20 - standing red TB 3-way SLR    Consulted and Agree with Plan of Care  Patient       Patient will benefit from skilled therapeutic intervention in order to improve the following deficits and impairments:  Abnormal gait, Decreased activity tolerance, Decreased balance, Decreased endurance, Decreased knowledge of precautions, Decreased knowledge of use of DME, Decreased mobility, Decreased range of motion, Decreased safety awareness, Decreased scar mobility, Decreased strength, Difficulty walking, Hypomobility, Increased edema, Increased fascial restricitons, Increased muscle spasms, Impaired perceived functional ability, Impaired flexibility, Impaired sensation, Improper body mechanics, Postural dysfunction, Pain  Visit Diagnosis: Stiffness of left hip, not elsewhere classified  Pain in left hip  Other abnormalities of gait and mobility  Muscle weakness (generalized)  Difficulty in walking, not elsewhere classified     Problem List Patient Active Problem List   Diagnosis Date Noted  . Status post total hip replacement, left 12/14/2019  . Degenerative tear of meniscus of right knee 09/04/2019  . Primary osteoarthritis of left hip 09/04/2019  . Left lower quadrant abdominal pain 09/04/2019  . Monoallelic mutation of PALB2 gene 05/31/2018  . Genetic testing  05/31/2018  . Hypertension 05/16/2018  . Left elbow pain 05/16/2018  . Family history of genetic disease carrier   . Family history of breast cancer   . Family history of lung cancer   . Family history of throat cancer   . Post concussion syndrome 09/15/2017  . Restless leg syndrome 09/15/2017  . Rheumatoid arthritis ([place]) 04/01/2016  . Dyslipidemia 12/25/2015  . Tobacco use disorder 12/25/2015  . Insomnia 12/25/2015  . Primary osteoarthritis of left knee 07/30/2015  . GERD (gastroesophageal reflux disease) 03/04/2014  . Major depression 03/04/2014  . Asthmatic bronchitis 09/19/2013  . ED (erectile dysfunction) 04/19/2013  . Dorsalgia 11/21/2011  . Hyperlipidemia 11/21/2011  . Gout 11/21/2011  . GAD (generalized anxiety disorder) 11/21/2011    Bess Harvest, PTA 01/31/20 6:31 PM   [facility] [place] [facility] [address]  [place], [place], [address] Phone: [phone]   Fax:  [phone]  Name: Timothy Mcintosh [place] MRN: [id number] Date of Birth: June 06, 1966

## 2020-02-04 ENCOUNTER — Ambulatory Visit: Payer: 59

## 2020-02-07 ENCOUNTER — Ambulatory Visit: Payer: 59 | Admitting: Physical Therapy

## 2020-02-08 ENCOUNTER — Other Ambulatory Visit: Payer: Self-pay | Admitting: Family Medicine

## 2020-02-08 DIAGNOSIS — F5101 Primary insomnia: Secondary | ICD-10-CM

## 2020-02-11 ENCOUNTER — Ambulatory Visit: Payer: 59

## 2020-02-14 ENCOUNTER — Encounter: Payer: 59 | Admitting: Physical Therapy

## 2020-02-20 ENCOUNTER — Encounter: Payer: Self-pay | Admitting: Family Medicine

## 2020-02-20 ENCOUNTER — Telehealth: Payer: Self-pay | Admitting: Family Medicine

## 2020-02-20 NOTE — Telephone Encounter (Signed)
Spoke with patient about gel injection.   Myra Rude, MD [facility] 02/20/2020, 3:17 PM

## 2020-02-21 ENCOUNTER — Ambulatory Visit (INDEPENDENT_AMBULATORY_CARE_PROVIDER_SITE_OTHER): Payer: 59 | Admitting: Family Medicine

## 2020-02-21 ENCOUNTER — Ambulatory Visit: Payer: Self-pay

## 2020-02-21 ENCOUNTER — Encounter: Payer: Self-pay | Admitting: Family Medicine

## 2020-02-21 ENCOUNTER — Other Ambulatory Visit: Payer: Self-pay

## 2020-02-21 VITALS — BP 132/67 | HR 80 | Ht 67.0 in | Wt 165.0 lb

## 2020-02-21 DIAGNOSIS — M1711 Unilateral primary osteoarthritis, right knee: Secondary | ICD-10-CM | POA: Diagnosis not present

## 2020-02-21 MED ORDER — KETOROLAC TROMETHAMINE 30 MG/ML IJ SOLN
30.0000 mg | Freq: Once | INTRAMUSCULAR | Status: AC
  Administered 2020-02-21: 30 mg via INTRA_ARTICULAR

## 2020-02-21 NOTE — Assessment & Plan Note (Signed)
Acute on chronic. Worsening as of late. Mild improvement with CSI.  -Followed by intra-articular Toradol injection. - counseled on HEP and supportive care - could consider MRI

## 2020-02-21 NOTE — Patient Instructions (Signed)
Good to see you  Please try ice  Please send me a message in MyChart with any questions or updates.  Please see [place] back in 4-6 weeks or as needed.   --Dr. Jordan Likes

## 2020-02-21 NOTE — Addendum Note (Signed)
Addended by: Kathi Simpers F on: 02/21/2020 02:03 PM   Modules accepted: Orders

## 2020-02-21 NOTE — Progress Notes (Signed)
Timothy Mcintosh - 54 y.o. male MRN [id number]  Date of birth: [birth year]  SUBJECTIVE:  Including CC & ROS.  No chief complaint on file.   Timothy Mcintosh is a 54 y.o. male that is presenting with acute worsening of his right knee pain.  Has tried a steroid injection with limited improvement.  Denies any inciting event or trauma.   Review of Systems See HPI   HISTORY: Past Medical, Surgical, Social, and Family History Reviewed & Updated per EMR.   Pertinent Historical Findings include:  Past Medical History:  Diagnosis Date  . Family history of breast cancer   . Family history of genetic disease carrier    sister PALB2+  . Family history of lung cancer   . Family history of throat cancer   . Headache   . Hyperlipidemia   . Hypertension   . Rheumatoid arthritis ([place])   . Vision abnormalities     Past Surgical History:  Procedure Laterality Date  . BACK SURGERY Bilateral    c5 -c6  . CARPAL TUNNEL RELEASE Right   . KNEE SURGERY Bilateral   . TOTAL HIP ARTHROPLASTY Left 12/14/2019   Procedure: TOTAL HIP ARTHROPLASTY ANTERIOR APPROACH;  Surgeon: Dorna Leitz, MD;  Location: [facility];  Service: Orthopedics;  Laterality: Left;    Family History  Problem Relation Age of Onset  . High Cholesterol Father   . Kidney Stones Father   . Heart failure Maternal Grandfather        Diet age 55 of "massive heart failure" unknown if MI or not  . Lung cancer Mother   . Breast cancer Mother 48  . Kidney cancer Mother   . Skin cancer Mother   . Breast cancer Sister 71       PALB2+  . Breast cancer Paternal Grandfather        69's  . Throat cancer Paternal Grandfather   . Other Sister        PALB2+  . Throat cancer Other   . Throat cancer Other     Social History   Socioeconomic History  . Marital status: Married    Spouse name: Not on file  . Number of children: Not on file  . Years of education: Not on file  . Highest education level: Not on file  Occupational History  .  Not on file  Tobacco Use  . Smoking status: Current Every Day Smoker  . Smokeless tobacco: Never Used  . Tobacco comment: smoking since age 35  Substance and Sexual Activity  . Alcohol use: No    Alcohol/week: 0.0 standard drinks  . Drug use: No  . Sexual activity: Not on file  Other Topics Concern  . Not on file  Social History Narrative  . Not on file   Social Determinants of Health   Financial Resource Strain:   . Difficulty of Paying Living Expenses:   Food Insecurity:   . Worried About Charity fundraiser in the Last Year:   . Arboriculturist in the Last Year:   Transportation Needs:   . Film/video editor (Medical):   Marland Kitchen Lack of Transportation (Non-Medical):   Physical Activity:   . Days of Exercise per Week:   . Minutes of Exercise per Session:   Stress:   . Feeling of Stress :   Social Connections:   . Frequency of Communication with Friends and Family:   . Frequency of Social Gatherings with Friends and Family:   .  Attends Religious Services:   . Active Member of Clubs or Organizations:   . Attends Archivist Meetings:   Marland Kitchen Marital Status:   Intimate Partner Violence:   . Fear of Current or Ex-Partner:   . Emotionally Abused:   Marland Kitchen Physically Abused:   . Sexually Abused:      PHYSICAL EXAM:  VS: There were no vitals taken for this visit. Physical Exam Gen: NAD, alert, cooperative with exam, well-appearing    Aspiration/Injection Procedure Note Timothy Mcintosh Mar 08, 1966  Procedure: Injection Indications: Right knee pain  Procedure Details Consent: Risks of procedure as well as the alternatives and risks of each were explained to the (patient/caregiver).  Consent for procedure obtained. Time Out: Verified patient identification, verified procedure, site/side was marked, verified correct patient position, special equipment/implants available, medications/allergies/relevent history reviewed, required imaging and test results available.   Performed.  The area was cleaned with iodine and alcohol swabs.    The right knee superior lateral suprapatellar pouch was injected using 1 cc's of 30 mg Toradol and 4 cc's of 0.25% bupivacaine with a 22 1 1/2" needle.  Ultrasound was used. Images were obtained in long views showing the injection.     A sterile dressing was applied.  Patient did tolerate procedure well.        ASSESSMENT & PLAN:   OA (osteoarthritis) of knee Acute on chronic. Worsening as of late. Mild improvement with CSI.  -Followed by intra-articular Toradol injection. - counseled on HEP and supportive care - could consider MRI

## 2020-03-11 ENCOUNTER — Other Ambulatory Visit: Payer: Self-pay

## 2020-03-11 ENCOUNTER — Ambulatory Visit: Payer: 59 | Attending: Orthopedic Surgery | Admitting: Physical Therapy

## 2020-03-11 ENCOUNTER — Encounter: Payer: Self-pay | Admitting: Physical Therapy

## 2020-03-11 DIAGNOSIS — M25552 Pain in left hip: Secondary | ICD-10-CM

## 2020-03-11 DIAGNOSIS — R262 Difficulty in walking, not elsewhere classified: Secondary | ICD-10-CM

## 2020-03-11 DIAGNOSIS — M6281 Muscle weakness (generalized): Secondary | ICD-10-CM | POA: Diagnosis present

## 2020-03-11 DIAGNOSIS — M25652 Stiffness of left hip, not elsewhere classified: Secondary | ICD-10-CM | POA: Diagnosis not present

## 2020-03-11 DIAGNOSIS — R2689 Other abnormalities of gait and mobility: Secondary | ICD-10-CM | POA: Diagnosis present

## 2020-03-11 NOTE — Patient Instructions (Signed)
    Home exercise program created by Megon Kalina, PT.  For questions, please contact Jennika Ringgold via phone at [phone] or email at [email or web address]  [facility] [place] [address]  [address] [place], [place], [address] Phone: [phone]   Fax:  [phone]    

## 2020-03-11 NOTE — Therapy (Signed)
[facility] [address]  [place], [place], [address] Phone: [phone]   Fax:  [phone]  Physical Therapy Re-Eval / Treatment  Patient Details  Name: FREDRIC SLABACH MRN: [id number] Date of Birth: [birth year] Referring Provider (PT): Dorna Leitz, MD   Encounter Date: 03/11/2020  PT End of Session - 03/11/20 1615    Visit Number  9    Number of Visits  15    Date for PT Re-Evaluation  04/01/20    Authorization Type  [employer]    Authorization Time Period  01/02/20 - 04/01/20    PT Start Time  1615    PT Stop Time  1658    PT Time Calculation (min)  43 min    Activity Tolerance  Patient tolerated treatment well    Behavior During Therapy  [facility] for tasks assessed/performed       Past Medical History:  Diagnosis Date  . Family history of breast cancer   . Family history of genetic disease carrier    sister PALB2+  . Family history of lung cancer   . Family history of throat cancer   . Headache   . Hyperlipidemia   . Hypertension   . Rheumatoid arthritis ([place])   . Vision abnormalities     Past Surgical History:  Procedure Laterality Date  . BACK SURGERY Bilateral    c5 -c6  . CARPAL TUNNEL RELEASE Right   . KNEE SURGERY Bilateral   . TOTAL HIP ARTHROPLASTY Left 12/14/2019   Procedure: TOTAL HIP ARTHROPLASTY ANTERIOR APPROACH;  Surgeon: Dorna Leitz, MD;  Location: [facility];  Service: Orthopedics;  Laterality: Left;    There were no vitals filed for this visit.  Subjective Assessment - 03/11/20 1619    Subjective  Pt reports he has been working with MDs regarding R knee pain - received cortisone injection from [facility] with minimal to no benefit. Gel injections recommended but unable to get insurance approval. F/u with surgeon - hip looked great but x-rayed R knee revealing degenerative changes. Surgeon gave him another cortisone injection with slightly better relief but also recommending gel injections  vs surgery.    Pertinent History  L THA 12/14/19 (anterior approach), neck surgery, lumbar DDD, B knee surgery    Patient Stated Goals  "to get back to walking and recovery from surgery w/o developing bad habits"    Currently in Pain?  No/denies    Pain Score  0-No pain   occasional 2-3/10 achiness   Pain Location  Hip    Pain Orientation  Left    Pain Frequency  Intermittent    Pain Score  0    Pain Location  Knee    Pain Orientation  Right    Pain Onset  --    Pain Frequency  Intermittent         [facility] PT Assessment - 03/11/20 1615      Assessment   Medical Diagnosis  L THA    Referring Provider (PT)  Dorna Leitz, MD    Onset Date/Surgical Date  12/14/19    Next MD Visit  3 month f/u for hip; potenially sooner for R knee      AROM   Overall AROM Comments  L hip A/PROM mild to mderately limited in all planes due to tightness > pain, but improved as compared to initial eval      Strength   Right Hip Flexion  4+/5  Right Hip Extension  4+/5    Right Hip External Rotation   4+/5    Right Hip Internal Rotation  5/5    Right Hip ABduction  4+/5    Right Hip ADduction  4+/5    Left Hip Flexion  4/5    Left Hip Extension  4-/5    Left Hip External Rotation  4/5    Left Hip Internal Rotation  4+/5    Left Hip ABduction  4-/5    Left Hip ADduction  4-/5    Right Knee Flexion  4+/5    Right Knee Extension  5/5    Left Knee Flexion  5/5    Left Knee Extension  5/5      Ambulation/Gait   Ambulation/Gait Assistance  7: Independent    Assistive device  None    Gait Pattern  Step-through pattern;Decreased stride length;Decreased weight shift to right;Decreased stance time - right    Stairs Assistance  7: Independent    Stair Management Technique  No rails;Alternating pattern;Forwards    Number of Stairs  14    Height of Stairs  7    Gait Comments  Gait pattern improved with better hip extension and longer stride but increased weight shift to L d/t R knee pain. Pt notes  increased limp with fatigue with side of limp varying depending on R knee vs L hip pain - will revert to using cane when this happens.                   [facility] PT Treatment/Exercise - 03/11/20 1615      Exercises   Exercises  Knee/Hip      Knee/Hip Exercises: Stretches   Passive Hamstring Stretch  Left;30 seconds;1 rep    Passive Hamstring Stretch Limitations  supine with strap    Hip Flexor Stretch  Left;30 seconds;2 reps    Hip Flexor Stretch Limitations  mod thomas with strap & mod thomas + opp hip flexion    ITB Stretch  Left;30 seconds;1 rep    ITB Stretch Limitations  supine with strap     Piriformis Stretch  Left;30 seconds;2 reps    Piriformis Stretch Limitations  supine KTOS & figure 4 to chest with strap/hand assist    Other Knee/Hip Stretches  Seated L hip adductor stretch x 30 sec      Knee/Hip Exercises: Aerobic   Recumbent Bike  L2 x 6 min             PT Education - 03/11/20 1658    Education Details  HEP update - hip stretches for all motions    Person(s) Educated  Patient    Methods  Explanation;Demonstration;Handout    Comprehension  Verbalized understanding;Returned demonstration;Need further instruction       PT Short Term Goals - 01/28/20 0939      PT SHORT TERM GOAL #1   Title  Patient will be independent with initial HEP    Status  Achieved   01/21/20     PT SHORT TERM GOAL #2   Title  Patient will tolerate >/= neutral hip extension to improve tolerance for sleeping and supine exercises    Status  Achieved   01/21/20       PT Long Term Goals - 03/11/20 1635      PT LONG TERM GOAL #1   Title  Patient will be independent with ongoing/advanced HEP    Status  Partially Met    Target Date  04/01/20      PT LONG TERM GOAL #2   Title  Patient to improve L hip AROM to [facility] without pain provocation    Status  Partially Met   03/11/20 pain only with end-range hip abduction   Target Date  04/01/20      PT LONG TERM GOAL #3    Title  Patient will demonstrate improved L hip and knee strength to >/= 4/5 to 4+/5 for improved stability and activity tolerance    Status  Partially Met    Target Date  04/01/20      PT LONG TERM GOAL #4   Title  Patient will ambulate with normal gait pattern w/o need for AD    Status  Partially Met    Target Date  04/01/20      PT LONG TERM GOAL #5   Title  Patient to report ability to perform ADLs, household and work-related tasks without increased pain    Status  Partially Met   01/21/20: Pt. reporting he was able to get on his boat on Saturday and felt comfortable.  Denies difficulty with work-related or household tasks.  Still having to sock aid for donning socks.   Target Date  04/01/20            Plan - 03/11/20 1658    Clinical Impression Statement  Adrien returning to PT after >5-week absence while trying to address R knee pain which had previously been hindering PT tolerance. He was told imaging revealed degenerative changes and gel injections were recommended but he has been unable to get insurance coverage thus far. He reports he has been working on his HEP to the degree that the R knee pain will allow and feels that his flexibility is improving (now able to reach his foot to don/doff socks/shoes w/o adaptive equipment) but still notes limitations in all motions. L hip strength has improved but still limited with greatest weakness in abduction, adduction and ER. Gait pattern w/o AD improved with better hip extension and somewhat longer stride but increased weight shift to L d/t R knee pain. He still notes increased limp with fatigue by end of most days with side of limp varying depending on R knee vs L hip pain - will revert to using cane when this happens. Initial STGs have been met but LTGs only partially met. Given ongoing deficits, will recommend resuming PT with frequency of 2x/wk x 3 weeks until end of current insurance authorization at which time, we will reassess to  determine need for further PT vs readiness to transition to HEP.    Personal Factors and Comorbidities  Comorbidity 3+;Time since onset of injury/illness/exacerbation;Past/Current Experience;Profession    Comorbidities  Asthmatic bronchitis, GERD, OA - L hip and knee, R meniscal tear, B knee surgery, LBP/lumbar DDD, neck surgery, Gout    Examination-Activity Limitations  Bathing;Bed Mobility;Bend;Caring for Others;Carry;Dressing;Hygiene/Grooming;Lift;Locomotion Level;Sit;Sleep;Squat;Stairs;Stand;Toileting;Transfers    Examination-Participation Restrictions  Cleaning;Community Activity;Driving;Interpersonal Relationship;Laundry;Shop;Volunteer;Yard Work    Rehab Potential  Good    PT Frequency  2x / week    PT Duration  3 weeks    PT Treatment/Interventions  ADLs/Self Care Home Management;Cryotherapy;Electrical Stimulation;Iontophoresis 80m/ml Dexamethasone;Moist Heat;DME Instruction;Gait training;Stair training;Functional mobility training;Therapeutic activities;Therapeutic exercise;Balance training;Neuromuscular re-education;Patient/family education;Manual techniques;Scar mobilization;Passive range of motion;Dry needling;Taping    PT Next Visit Plan  L hip ROM - review 5/11 stretching HEP udpate as needed, Core and LE strengthening - review & update strengthening HEP; Manual therapy and modalities PRN for pain and abnormal muscle tension  with possible trial of DN; Gait training for symmetrical step-through pattern;    PT Home Exercise Plan  HH HEP; seated march, standing hip abduction;  01/07/20 - standing HS curl, HS stretch, R/L standing weight shift, standing hip abduction; 01/21/20 -  bridge/abd (green TB at knees), sidelying clam shell (yellow TB - progressed to red TB 01/28/20), Bridge/adduction squeeze; 01/28/20 - standing red TB 3-way SLR; 03/11/20 - SKTC, piriformis, HS, ITB, hip flexor and adductor stretches    Consulted and Agree with Plan of Care  Patient       Patient will benefit from skilled  therapeutic intervention in order to improve the following deficits and impairments:  Abnormal gait, Decreased activity tolerance, Decreased balance, Decreased endurance, Decreased knowledge of precautions, Decreased knowledge of use of DME, Decreased mobility, Decreased range of motion, Decreased safety awareness, Decreased scar mobility, Decreased strength, Difficulty walking, Hypomobility, Increased edema, Increased fascial restricitons, Increased muscle spasms, Impaired perceived functional ability, Impaired flexibility, Impaired sensation, Improper body mechanics, Postural dysfunction, Pain  Visit Diagnosis: Stiffness of left hip, not elsewhere classified  Pain in left hip  Other abnormalities of gait and mobility  Muscle weakness (generalized)  Difficulty in walking, not elsewhere classified     Problem List Patient Active Problem List   Diagnosis Date Noted  . Status post total hip replacement, left 12/14/2019  . OA (osteoarthritis) of knee 09/04/2019  . Primary osteoarthritis of left hip 09/04/2019  . Left lower quadrant abdominal pain 09/04/2019  . Monoallelic mutation of PALB2 gene 05/31/2018  . Genetic testing 05/31/2018  . Hypertension 05/16/2018  . Left elbow pain 05/16/2018  . Family history of genetic disease carrier   . Family history of breast cancer   . Family history of lung cancer   . Family history of throat cancer   . Post concussion syndrome 09/15/2017  . Restless leg syndrome 09/15/2017  . Rheumatoid arthritis ([place]) 04/01/2016  . Dyslipidemia 12/25/2015  . Tobacco use disorder 12/25/2015  . Insomnia 12/25/2015  . Primary osteoarthritis of left knee 07/30/2015  . GERD (gastroesophageal reflux disease) 03/04/2014  . Major depression 03/04/2014  . Asthmatic bronchitis 09/19/2013  . ED (erectile dysfunction) 04/19/2013  . Dorsalgia 11/21/2011  . Hyperlipidemia 11/21/2011  . Gout 11/21/2011  . GAD (generalized anxiety disorder) 11/21/2011    Percival Spanish, PT, MPT 03/11/2020, 5:41 PM  [facility] [address]  [place], [place], [address] Phone: [phone]   Fax:  [phone]  Name: LEARY MCNULTY MRN: [id number] Date of Birth: Mar 07, 1966

## 2020-03-19 ENCOUNTER — Encounter: Payer: Self-pay | Admitting: Physical Therapy

## 2020-03-19 ENCOUNTER — Other Ambulatory Visit: Payer: Self-pay

## 2020-03-19 ENCOUNTER — Ambulatory Visit: Payer: 59 | Admitting: Physical Therapy

## 2020-03-19 DIAGNOSIS — R262 Difficulty in walking, not elsewhere classified: Secondary | ICD-10-CM

## 2020-03-19 DIAGNOSIS — M6281 Muscle weakness (generalized): Secondary | ICD-10-CM

## 2020-03-19 DIAGNOSIS — M25552 Pain in left hip: Secondary | ICD-10-CM

## 2020-03-19 DIAGNOSIS — M25652 Stiffness of left hip, not elsewhere classified: Secondary | ICD-10-CM

## 2020-03-19 DIAGNOSIS — R2689 Other abnormalities of gait and mobility: Secondary | ICD-10-CM

## 2020-03-19 NOTE — Therapy (Addendum)
[facility] [address]  [place], [place], [address] Phone: [phone]   Fax:  [phone]  Physical Therapy Treatment / Discharge Summary  Patient Details  Name: Timothy Mcintosh MRN: [id number] Date of Birth: 1966/03/05 Referring Provider (PT): Dorna Leitz, MD   Encounter Date: 03/19/2020  PT End of Session - 03/19/20 0846    Visit Number  10    Number of Visits  15    Date for PT Re-Evaluation  04/01/20    Authorization Type  [employer]    Authorization Time Period  01/02/20 - 04/01/20    PT Start Time  0846    PT Stop Time  0925    PT Time Calculation (min)  39 min    Activity Tolerance  Patient tolerated treatment well    Behavior During Therapy  [facility] for tasks assessed/performed       Past Medical History:  Diagnosis Date  . Family history of breast cancer   . Family history of genetic disease carrier    sister PALB2+  . Family history of lung cancer   . Family history of throat cancer   . Headache   . Hyperlipidemia   . Hypertension   . Rheumatoid arthritis ([place])   . Vision abnormalities     Past Surgical History:  Procedure Laterality Date  . BACK SURGERY Bilateral    c5 -c6  . CARPAL TUNNEL RELEASE Right   . KNEE SURGERY Bilateral   . TOTAL HIP ARTHROPLASTY Left 12/14/2019   Procedure: TOTAL HIP ARTHROPLASTY ANTERIOR APPROACH;  Surgeon: Dorna Leitz, MD;  Location: [facility];  Service: Orthopedics;  Laterality: Left;    There were no vitals filed for this visit.  Subjective Assessment - 03/19/20 0851    Subjective  Doing well today - denies pain. Denies any issues with revised/updated HEP stretches.    Pertinent History  L THA 12/14/19 (anterior approach), neck surgery, lumbar DDD, B knee surgery    Patient Stated Goals  "to get back to walking and recovery from surgery w/o developing bad habits"    Currently in Pain?  No/denies                        [facility] Adult PT  Treatment/Exercise - 03/19/20 0846      Exercises   Exercises  Knee/Hip      Knee/Hip Exercises: Aerobic   Recumbent Bike  L2 x 6 min      Knee/Hip Exercises: Standing   Hip Flexion  Right;Left;10 reps;Stengthening;Knee straight    Hip Flexion Limitations  red TB at ankle; 2 pole A for balance    Hip ADduction  Right;Left;10 reps;Strengthening    Hip ADduction Limitations  red TB at ankle; 2 pole A for balance    Hip Abduction  Right;Left;10 reps;Stengthening;Knee straight    Abduction Limitations  red TB at ankle; 2 pole A for balance    Hip Extension  Right;Left;10 reps;Stengthening;Knee straight    Extension Limitations  red TB at ankle; 2 pole A for balance    Other Standing Knee Exercises  B lateral band walk and fwd/back monster walk with looped red TB (TB at ankles for L side step & fwd/back monster walk, TB above knees for R side step)2 x  20 ft each      Knee/Hip Exercises: Seated   Clamshell with TheraBand  Red   L hip ABD/ER 10 x 3"  PT Education - 03/19/20 0924    Education Details  HEP update - revised strengthening program - standing red TB 4-way SLR, lateral & fwd/back red TB monster walk, seated red TB L clam    Person(s) Educated  Patient    Methods  Explanation;Demonstration;Handout;Verbal cues    Comprehension  Verbalized understanding;Returned demonstration;Verbal cues required;Need further instruction       PT Short Term Goals - 01/28/20 0939      PT SHORT TERM GOAL #1   Title  Patient will be independent with initial HEP    Status  Achieved   01/21/20     PT SHORT TERM GOAL #2   Title  Patient will tolerate >/= neutral hip extension to improve tolerance for sleeping and supine exercises    Status  Achieved   01/21/20       PT Long Term Goals - 03/11/20 1635      PT LONG TERM GOAL #1   Title  Patient will be independent with ongoing/advanced HEP    Status  Partially Met    Target Date  04/01/20      PT LONG TERM GOAL #2    Title  Patient to improve L hip AROM to [facility] without pain provocation    Status  Partially Met   03/11/20 pain only with end-range hip abduction   Target Date  04/01/20      PT LONG TERM GOAL #3   Title  Patient will demonstrate improved L hip and knee strength to >/= 4/5 to 4+/5 for improved stability and activity tolerance    Status  Partially Met    Target Date  04/01/20      PT LONG TERM GOAL #4   Title  Patient will ambulate with normal gait pattern w/o need for AD    Status  Partially Met    Target Date  04/01/20      PT LONG TERM GOAL #5   Title  Patient to report ability to perform ADLs, household and work-related tasks without increased pain    Status  Partially Met   01/21/20: Pt. reporting he was able to get on his boat on Saturday and felt comfortable.  Denies difficulty with work-related or household tasks.  Still having to sock aid for donning socks.   Target Date  04/01/20            Plan - 03/19/20 0853    Clinical Impression Statement  Timothy Mcintosh reports no issues with stretching HEP update from last visit and denies need for review today. He states he has not been working on strengthening exercises at home as most of these exercises seem to exacerbate his R knee pain, therefore session focusing on revamping home strengthening program to target continued areas of weakness w/o exacerbating the R knee pain. Pt able to perform all exercises included in today's HEP handout w/o pain in R knee or L hip.    Personal Factors and Comorbidities  Comorbidity 3+;Time since onset of injury/illness/exacerbation;Past/Current Experience;Profession    Comorbidities  Asthmatic bronchitis, GERD, OA - L hip and knee, R meniscal tear, B knee surgery, LBP/lumbar DDD, neck surgery, Gout    Examination-Activity Limitations  Bathing;Bed Mobility;Bend;Caring for Others;Carry;Dressing;Hygiene/Grooming;Lift;Locomotion Level;Sit;Sleep;Squat;Stairs;Stand;Toileting;Transfers    Examination-Participation  Restrictions  Cleaning;Community Activity;Driving;Interpersonal Relationship;Laundry;Shop;Volunteer;Yard Work    Rehab Potential  Good    PT Frequency  2x / week    PT Duration  3 weeks    PT Treatment/Interventions  ADLs/Self Care Home Management;Cryotherapy;Electrical Stimulation;Iontophoresis '4mg'$ /ml  Dexamethasone;Moist Heat;DME Instruction;Gait training;Stair training;Functional mobility training;Therapeutic activities;Therapeutic exercise;Balance training;Neuromuscular re-education;Patient/family education;Manual techniques;Scar mobilization;Passive range of motion;Dry needling;Taping    PT Next Visit Plan  Core and LE strengthening (avoiding exacerbating R knee pain); Manual therapy and modalities PRN for pain and abnormal muscle tension with possible trial of DN; Gait training for symmetrical step-through pattern;    PT Home Exercise Plan  03/11/20 - SKTC, piriformis, HS, ITB, hip flexor and adductor stretches; 03/19/20 - standing red TB 4-way SLR, lateral & fwd/back red TB monster walk, seated red TB L clam    Consulted and Agree with Plan of Care  Patient       Patient will benefit from skilled therapeutic intervention in order to improve the following deficits and impairments:  Abnormal gait, Decreased activity tolerance, Decreased balance, Decreased endurance, Decreased knowledge of precautions, Decreased knowledge of use of DME, Decreased mobility, Decreased range of motion, Decreased safety awareness, Decreased scar mobility, Decreased strength, Difficulty walking, Hypomobility, Increased edema, Increased fascial restricitons, Increased muscle spasms, Impaired perceived functional ability, Impaired flexibility, Impaired sensation, Improper body mechanics, Postural dysfunction, Pain  Visit Diagnosis: Stiffness of left hip, not elsewhere classified  Pain in left hip  Other abnormalities of gait and mobility  Muscle weakness (generalized)  Difficulty in walking, not elsewhere  classified     Problem List Patient Active Problem List   Diagnosis Date Noted  . Status post total hip replacement, left 12/14/2019  . OA (osteoarthritis) of knee 09/04/2019  . Primary osteoarthritis of left hip 09/04/2019  . Left lower quadrant abdominal pain 09/04/2019  . Monoallelic mutation of PALB2 gene 05/31/2018  . Genetic testing 05/31/2018  . Hypertension 05/16/2018  . Left elbow pain 05/16/2018  . Family history of genetic disease carrier   . Family history of breast cancer   . Family history of lung cancer   . Family history of throat cancer   . Post concussion syndrome 09/15/2017  . Restless leg syndrome 09/15/2017  . Rheumatoid arthritis ([place]) 04/01/2016  . Dyslipidemia 12/25/2015  . Tobacco use disorder 12/25/2015  . Insomnia 12/25/2015  . Primary osteoarthritis of left knee 07/30/2015  . GERD (gastroesophageal reflux disease) 03/04/2014  . Major depression 03/04/2014  . Asthmatic bronchitis 09/19/2013  . ED (erectile dysfunction) 04/19/2013  . Dorsalgia 11/21/2011  . Hyperlipidemia 11/21/2011  . Gout 11/21/2011  . GAD (generalized anxiety disorder) 11/21/2011    Percival Spanish, PT, MPT 03/19/2020, 9:41 AM  [facility] [address]  [place], [place], [address] Phone: [phone]   Fax:  [phone]  Name: Timothy Mcintosh MRN: [id number] Date of Birth: 1966-07-16   PHYSICAL THERAPY DISCHARGE SUMMARY  Visits from Start of Care: 10  Current functional level related to goals / functional outcomes:   Refer to above clinical impression for status as of last visit on 03/19/2020. Patient cancelled next appointment on 03/26/20 and message left following patient's cancellation, reminding patient of expiration of insurance authorization as of 04/01/20. Requested patient call to reschedule on or before 04/01/20 if he would to continue with PT to allow for reassessment/recert to obtain necessary  measurements for insurance reauthorization request. Otherwise, requested patient inform PT if he would like to proceed with transition to HEP and discharge from PT. No response received from patient, therefore will proceed with discharge from PT for this episode.   Remaining deficits:   As above. Unable to formally assess status at discharge due to failure to return to PT.  Education / Equipment:   HEP  Plan: Patient agrees to discharge.  Patient goals were partially met. Patient is being discharged due to not returning since the last visit.  ?????     Percival Spanish, PT, MPT 05/30/20, 9:17 AM  [facility], [place], [address] Phone: [phone]   Fax:  [phone]

## 2020-03-19 NOTE — Patient Instructions (Signed)
    Home exercise program created by Ly Wass, PT.  For questions, please contact Raliegh Scobie via phone at [phone] or email at [email or web address]  [facility] [address]  [address] [place], [place], [address] Phone: [phone]   Fax:  [phone]    

## 2020-03-24 ENCOUNTER — Ambulatory Visit: Payer: 59 | Admitting: Physical Therapy

## 2020-03-24 ENCOUNTER — Other Ambulatory Visit: Payer: Self-pay

## 2020-03-26 ENCOUNTER — Ambulatory Visit: Payer: 59 | Admitting: Physical Therapy

## 2020-03-26 ENCOUNTER — Telehealth: Payer: Self-pay | Admitting: Physical Therapy

## 2020-03-26 NOTE — Telephone Encounter (Signed)
Message left following patient's cancellation of this morning's appointment, reminding patient of expiration of insurance authorization as of 04/01/20. Requested patient call to reschedule on or before 04/01/20 if he would to continue with PT to allow for reassessment/recert to obtain necessary measurements for insurance reauthorization request. Otherwise, requested patient inform PT if he would like to proceed with transition to HEP and discharge from PT.  Marry Guan, PT, MPT 03/26/20, 8:47 AM  [facility] [address]  [address] [place], [place], [address] Phone: [phone]   Fax:  [phone]

## 2020-05-09 ENCOUNTER — Other Ambulatory Visit: Payer: Self-pay | Admitting: Family Medicine

## 2020-05-09 DIAGNOSIS — N529 Male erectile dysfunction, unspecified: Secondary | ICD-10-CM

## 2020-06-11 ENCOUNTER — Other Ambulatory Visit: Payer: Self-pay | Admitting: Family Medicine

## 2020-06-11 DIAGNOSIS — K219 Gastro-esophageal reflux disease without esophagitis: Secondary | ICD-10-CM

## 2020-07-06 ENCOUNTER — Other Ambulatory Visit: Payer: Self-pay | Admitting: Family Medicine

## 2020-07-06 DIAGNOSIS — I10 Essential (primary) hypertension: Secondary | ICD-10-CM

## 2020-07-06 DIAGNOSIS — E785 Hyperlipidemia, unspecified: Secondary | ICD-10-CM

## 2020-07-23 ENCOUNTER — Other Ambulatory Visit: Payer: Self-pay | Admitting: Family Medicine

## 2020-07-23 DIAGNOSIS — M109 Gout, unspecified: Secondary | ICD-10-CM

## 2020-09-07 IMAGING — CT CT ABD-PELV W/ CM
2 of 5 series · 16 of 46 positions shown, 18 images · IV contrast (APPLIED)
Comparison: None.

CLINICAL DATA: Acute left lower quadrant and left groin pain.
Evaluate for diverticulitis and hernia.

EXAM:
CT ABDOMEN AND PELVIS WITH CONTRAST
TECHNIQUE: Multidetector CT imaging of the abdomen and pelvis was performed
using the standard protocol following bolus administration of
intravenous contrast.
CONTRAST:  100mL OMNIPAQUE IOHEXOL 300 MG/ML  SOLN

[Series 2: axial st · axial · 0.83mm/px · z∈[-514,-69]mm · 13 of 101 slices shown, 15 images]
[im 6/101  soft-tissue]
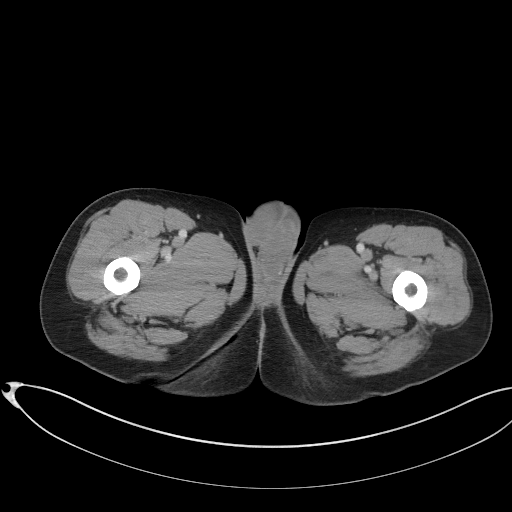
[im 6/101  bone]
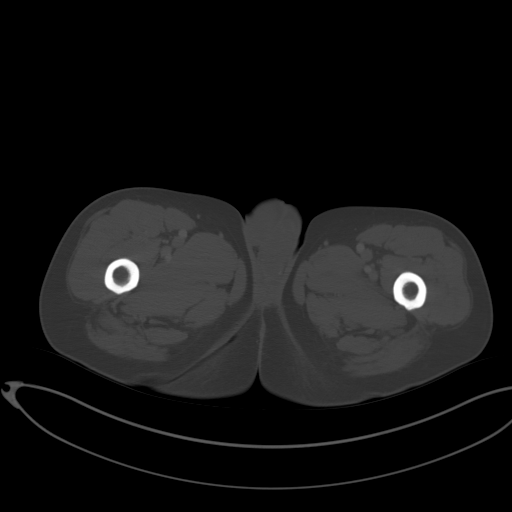
[im 12/101  soft-tissue]
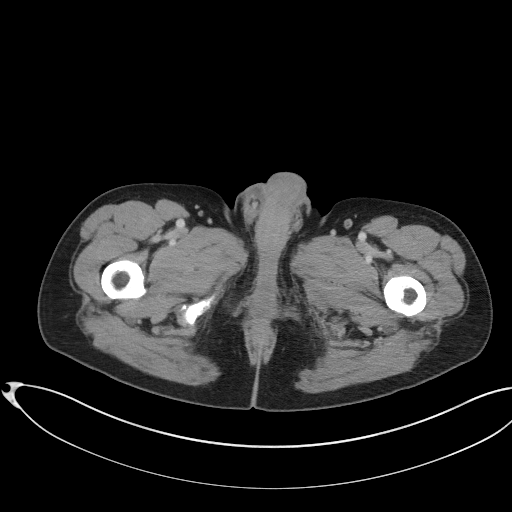
[im 23/101  soft-tissue]
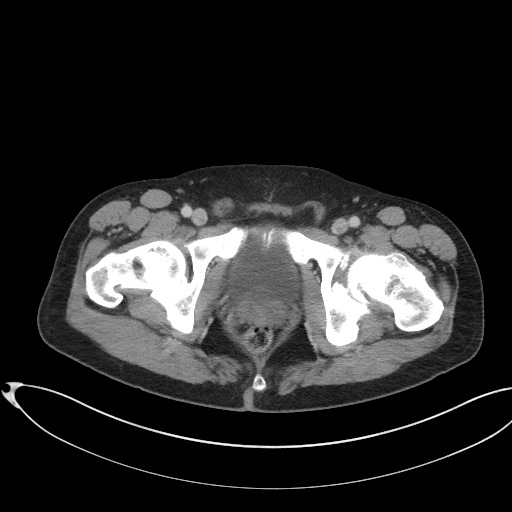
[im 28/101  soft-tissue]
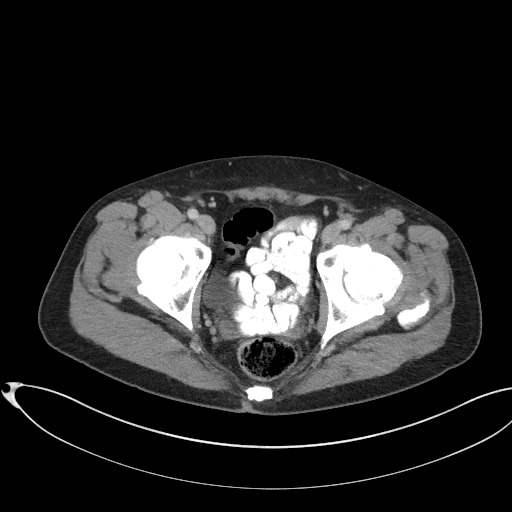
[im 34/101  soft-tissue]
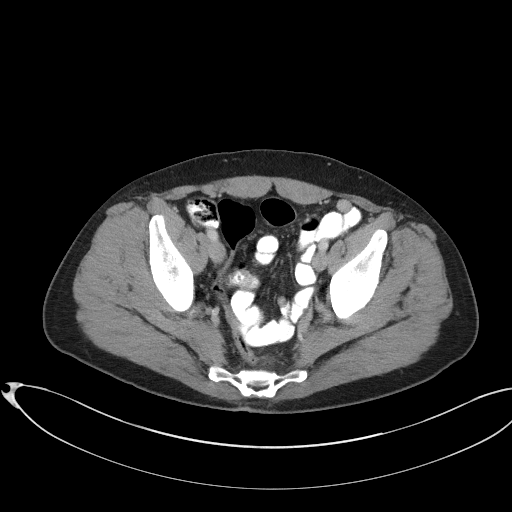
[im 45/101  soft-tissue]
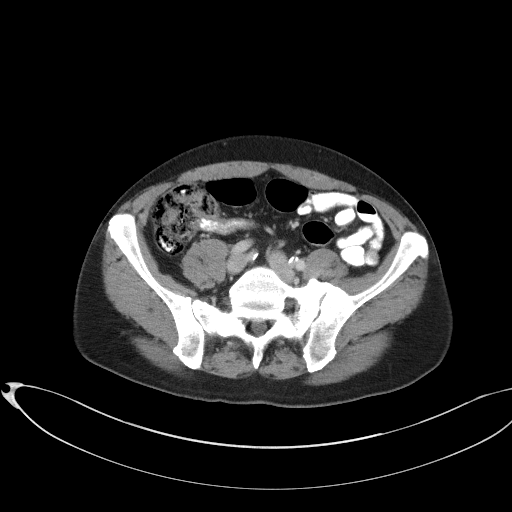
[im 51/101  soft-tissue]
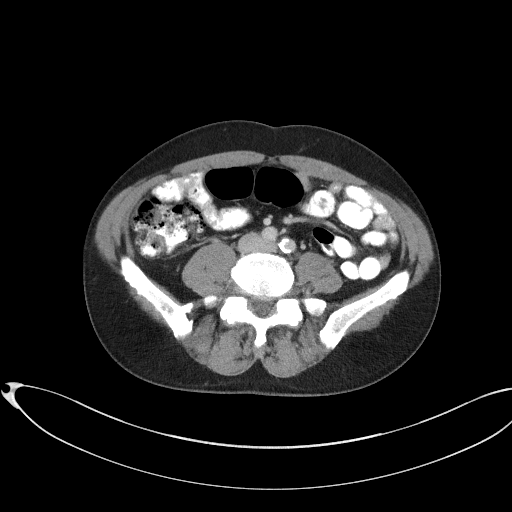
[im 56/101  soft-tissue]
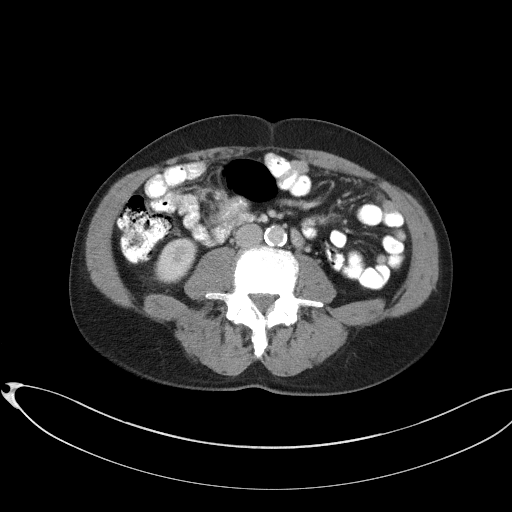
[im 67/101  soft-tissue]
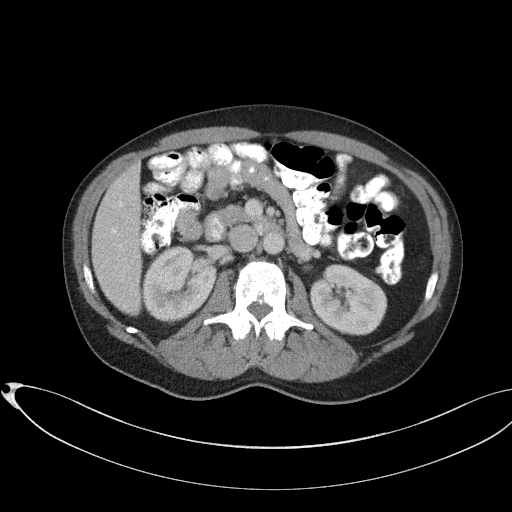
[im 67/101  bone]
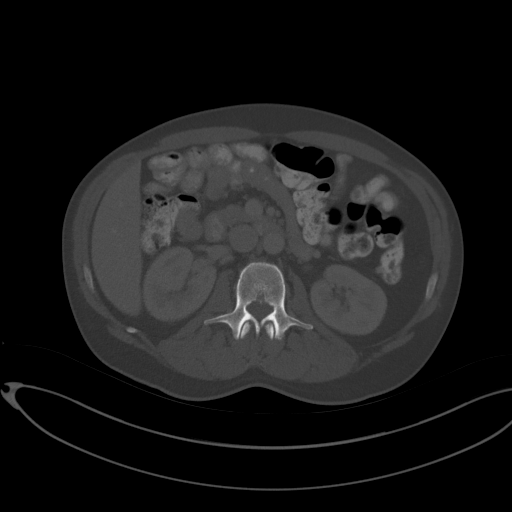
[im 73/101  soft-tissue]
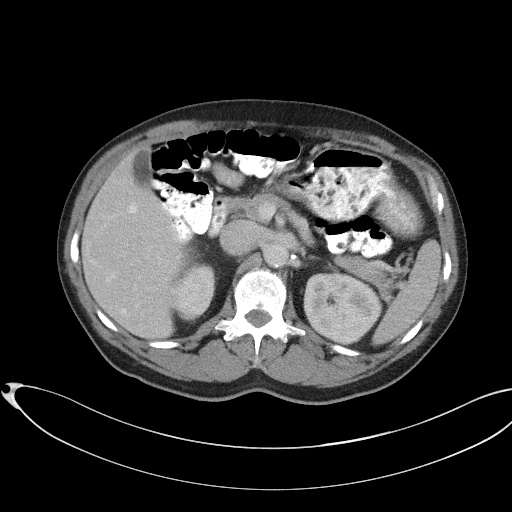
[im 78/101  soft-tissue]
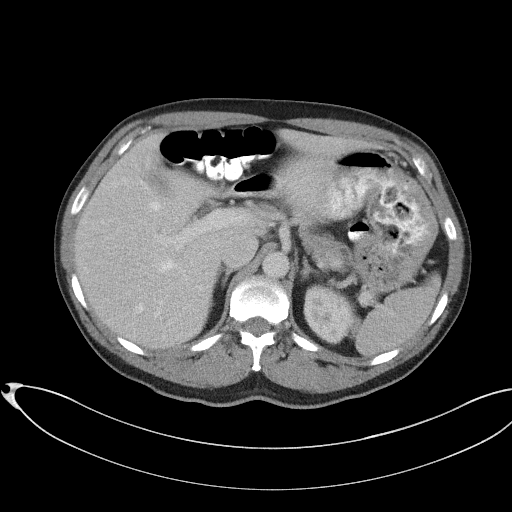
[im 89/101  soft-tissue]
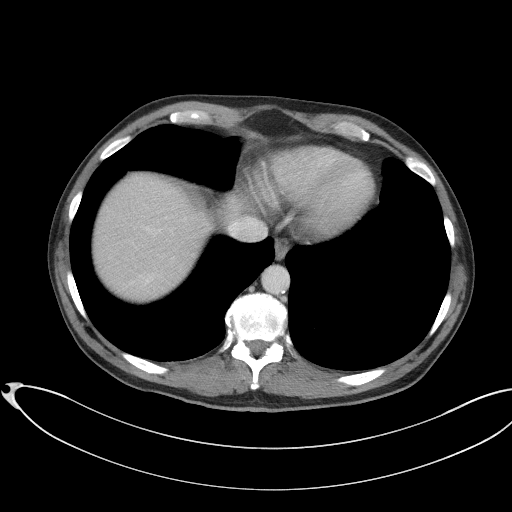
[im 95/101  soft-tissue]
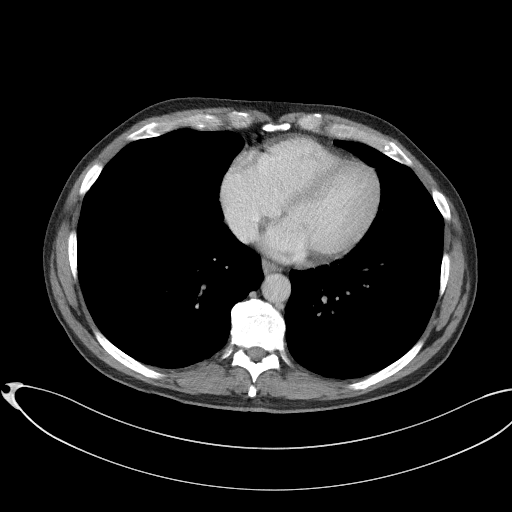

[Series 5: coronal st · coronal · 0.77mm/px · 3 of 89 slices shown]
[im 30/89  soft-tissue]
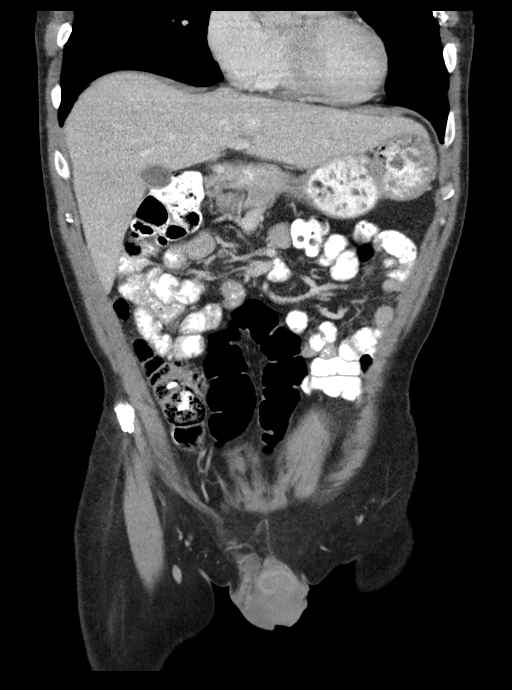
[im 40/89  soft-tissue]
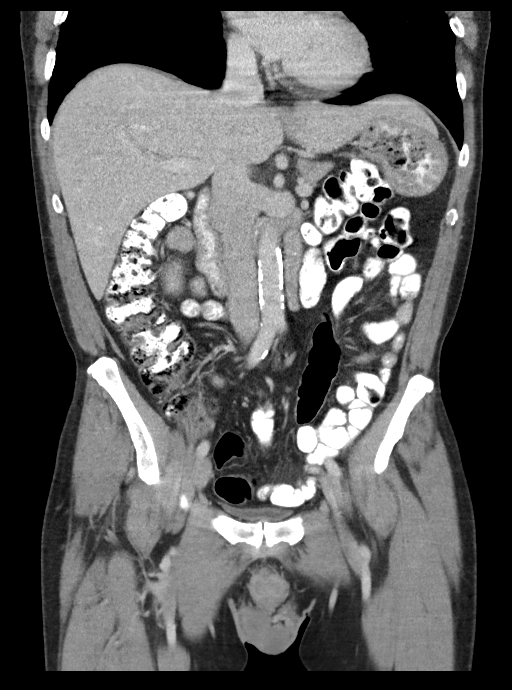
[im 49/89  soft-tissue]
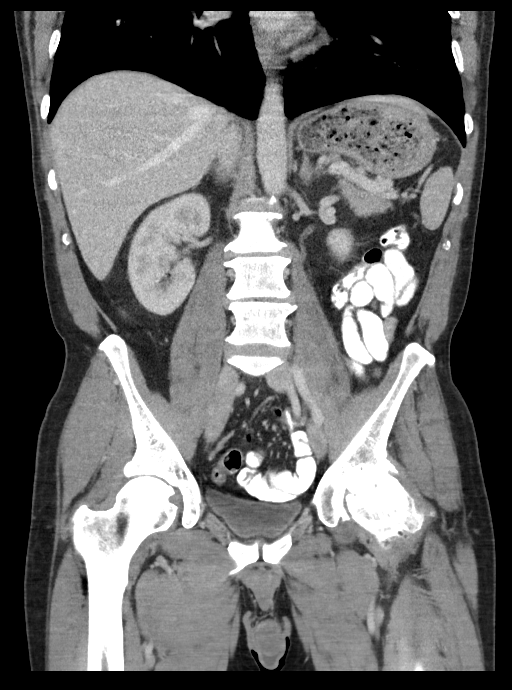

[16 of 46 positions shown; findings below may reference images not displayed]

FINDINGS: Lower Chest: No acute findings.

Hepatobiliary: No hepatic masses identified. Gallbladder is
unremarkable. No evidence of biliary ductal dilatation.

Pancreas:  No mass or inflammatory changes.

Spleen: Within normal limits in size and appearance.

Adrenals/Urinary Tract: No masses identified. No evidence of
hydronephrosis.

Stomach/Bowel: No evidence of obstruction, inflammatory process or
abnormal fluid collections.

Vascular/Lymphatic: No pathologically enlarged lymph nodes. No
abdominal aortic aneurysm. Aortic atherosclerosis.

Reproductive:  No mass or other significant abnormality.

Other:  No evidence of inguinal hernia or groin mass.

Musculoskeletal:  No suspicious bone lesions identified.
IMPRESSION: Negative. No evidence of diverticulitis, hernia, or other acute
findings.

Aortic Atherosclerosis ([id number]).

## 2020-09-20 NOTE — Progress Notes (Addendum)
[facility] [place], [place], [place] [address] [phone]  Date:  09/22/2020   Name:  Timothy Mcintosh   DOB:  1966-03-17   MRN:  [id number]  PCP:  Timothy Mclean, MD    Chief Complaint: Hypertension, Lung Cancer Screening, and Discuss Vaccine   History of Present Illness:  Timothy Mcintosh is a 54 y.o. 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 Amil Amen- he is not currently under any treatment as nothing seemed to work  He has 3 daughters and 7 grandchildren Had a hip replacement in February of this year.  This is doing well, he now needs a knee replacement- he plans to do this in 2022  His gout has been under control with allopurinol  Hep C screening-due today covid series- not done yet -we will get first dose today Flu shot-  Would like to do ASAP but not on same day as Covid per patient preference [facility] one year ago Smoking history- qualifies for CT lung cancer screening He had some joint pains with statins in the past- he is taking this and the joint pain is not any different  He does check his BP at home- running 100s/70- 80s  He is smoking about 1/2 PPD He is very physically active -he spends a lot of time with his 36 year old grandson doing activities such as golfing and doing  He had a vasectomy 30 years ago and had a small bump form which has been present for years. He would like to see urology and have this checked out   Patient Active Problem List   Diagnosis Date Noted  . Status post total hip replacement, left 12/14/2019  . OA (osteoarthritis) of knee 09/04/2019  . Primary  osteoarthritis of left hip 09/04/2019  . Left lower quadrant abdominal pain 09/04/2019  . Monoallelic mutation of PALB2 gene 05/31/2018  . Genetic testing 05/31/2018  . Hypertension 05/16/2018  . Left elbow pain 05/16/2018  . Family history of genetic disease carrier   . Family history of breast cancer   . Family history of lung cancer   . Family history of throat cancer   . Post concussion syndrome 09/15/2017  . Restless leg syndrome 09/15/2017  . Rheumatoid arthritis ([place]) 04/01/2016  . Dyslipidemia 12/25/2015  . Tobacco use disorder 12/25/2015  . Insomnia 12/25/2015  . Primary osteoarthritis of left knee 07/30/2015  . GERD (gastroesophageal reflux disease) 03/04/2014  . Major depression 03/04/2014  . Asthmatic bronchitis 09/19/2013  . ED (erectile dysfunction) 04/19/2013  . Dorsalgia 11/21/2011  . Hyperlipidemia 11/21/2011  . Gout 11/21/2011  . GAD (generalized anxiety disorder) 11/21/2011    Past Medical History:  Diagnosis Date  . Family history of breast cancer   . Family history of genetic disease carrier    sister PALB2+  . Family history of lung cancer   . Family history of throat cancer   . Headache   . Hyperlipidemia   . Hypertension   . Rheumatoid arthritis ([place])   . Vision abnormalities     Past Surgical History:  Procedure Laterality Date  . BACK SURGERY Bilateral    c5 -c6  .  CARPAL TUNNEL RELEASE Right   . KNEE SURGERY Bilateral   . TOTAL HIP ARTHROPLASTY Left 12/14/2019   Procedure: TOTAL HIP ARTHROPLASTY ANTERIOR APPROACH;  Surgeon: Dorna Leitz, MD;  Location: [facility];  Service: Orthopedics;  Laterality: Left;    Social History   Tobacco Use  . Smoking status: Current Every Day Smoker  . Smokeless tobacco: Never Used  . Tobacco comment: smoking since age 23  Vaping Use  . Vaping Use: Never used  Substance Use Topics  . Alcohol use: No    Alcohol/week: 0.0 standard drinks  . Drug use: No    Family History  Problem Relation Age of Onset   . High Cholesterol Father   . Kidney Stones Father   . Heart failure Maternal Grandfather        Diet age 59 of "massive heart failure" unknown if MI or not  . Lung cancer Mother   . Breast cancer Mother 49  . Kidney cancer Mother   . Skin cancer Mother   . Breast cancer Sister 33       PALB2+  . Breast cancer Paternal Grandfather        29's  . Throat cancer Paternal Grandfather   . Other Sister        PALB2+  . Throat cancer Other   . Throat cancer Other     No Known Allergies  Medication list has been reviewed and updated.  Current Outpatient Medications on File Prior to Visit  Medication Sig Dispense Refill  . Multiple Vitamin (MULTIVITAMIN) capsule Take 1 capsule by mouth daily.     . SUBOXONE 8-2 MG FILM Place 1 Film under the tongue 2 (two) times daily.   0  . traZODone (DESYREL) 100 MG tablet Take 1 tablet (100 mg total) by mouth at bedtime. 90 tablet 3   No current facility-administered medications on file prior to visit.    Review of Systems:  As per HPI- otherwise negative.   Physical Examination: Vitals:   09/22/20 1342  BP: 132/82  Pulse: 75  Resp: 16  SpO2: 95%   Vitals:   09/22/20 1342  Weight: 169 lb (76.7 kg)  Height: 5' 7"  (1.702 m)   Body mass index is 26.47 kg/m. Ideal Body Weight: Weight in (lb) to have BMI = 25: 159.3  GEN: no acute distress. HEENT: Atraumatic, Normocephalic.  Ears and Nose: No external deformity. CV: RRR, No M/G/R. No JVD. No thrill. No extra heart sounds. PULM: CTA B, no wheezes, crackles, rhonchi. No retractions. No resp. distress. No accessory muscle use. ABD: S, NT, ND, +BS. No rebound. No HSM. EXTR: No c/c/e PSYCH: Normally interactive. Conversant.    Assessment and Plan: Tobacco use disorder - Plan: CT CHEST LUNG CA SCREEN LOW DOSE W/O CM  Dyslipidemia - Plan: Lipid panel, simvastatin (ZOCOR) 40 MG tablet  Screening for prostate cancer - Plan: PSA  Screening for diabetes mellitus - Plan:  Comprehensive metabolic panel, Hemoglobin A1c  Gout, unspecified cause, unspecified chronicity, unspecified site - Plan: Uric acid, allopurinol (ZYLOPRIM) 300 MG tablet  Essential hypertension - Plan: CBC, Comprehensive metabolic panel  Encounter for hepatitis C screening test for low risk patient - Plan: Hepatitis C antibody  Accelerated hypertension - Plan: lisinopril (ZESTRIL) 20 MG tablet  Gastroesophageal reflux disease - Plan: omeprazole (PRILOSEC) 40 MG capsule  Erectile dysfunction, unspecified erectile dysfunction type - Plan: sildenafil (REVATIO) 20 MG tablet  Testicular mass - Plan: Ambulatory referral to Urology  Immunization due  Patient today for a physical exam Encouraged healthy diet and exercise routine, tobacco cessation He has been smoking enough to qualify for low-dose lung cancer screening CT.  Order this for him today Gout has been quiet with current dose of allopurinol.  Refilled allopurinol, check uric acid Blood pressure well controlled on current dose of lisinopril Refilled omeprazole which uses for GERD Patient has history of ADD which is responded well to sildenafil, refill He has been able to tolerate simvastatin for dyslipidemia, he does have some joint pains but these do not seem to get worse with statin medication.  Check lipids today Discussed his immunizations.  He is due for both flu and Covid.  He prefers not to get them on the same day.  We will have him start Covid series today which is more urgent He is on chronic Suboxone from another provider This visit occurred during the SARS-CoV-2 public health emergency.  Safety protocols were in place, including screening questions prior to the visit, additional usage of staff PPE, and extensive cleaning of exam room while observing appropriate contact time as indicated for disinfecting solutions.    Signed Lamar Blinks, MD  Received his labs 11/23-message to pt  Results for orders placed or  performed in visit on 09/22/20  CBC  Result Value Ref Range   WBC 12.8 (H) 4.0 - 10.5 K/uL   RBC 4.46 4.22 - 5.81 Mil/uL   Platelets 292.0 150 - 400 K/uL   Hemoglobin 13.0 13.0 - 17.0 g/dL   HCT 39.6 39 - 52 %   MCV 88.8 78.0 - 100.0 fl   MCHC 32.9 30.0 - 36.0 g/dL   RDW 14.2 11.5 - 15.5 %  Comprehensive metabolic panel  Result Value Ref Range   Sodium 137 135 - 145 mEq/L   Potassium 4.9 3.5 - 5.1 mEq/L   Chloride 103 96 - 112 mEq/L   CO2 27 19 - 32 mEq/L   Glucose, Bld 108 (H) 70 - 99 mg/dL   BUN 12 6 - 23 mg/dL   Creatinine, Ser 0.85 0.40 - 1.50 mg/dL   Total Bilirubin 0.4 0.2 - 1.2 mg/dL   Alkaline Phosphatase 107 39 - 117 U/L   AST 16 0 - 37 U/L   ALT 14 0 - 53 U/L   Total Protein 6.5 6.0 - 8.3 g/dL   Albumin 4.5 3.5 - 5.2 g/dL   GFR 97.77 >60.00 mL/min   Calcium 9.5 8.4 - 10.5 mg/dL  Hemoglobin A1c  Result Value Ref Range   Hgb A1c MFr Bld 5.7 4.6 - 6.5 %  Hepatitis C antibody  Result Value Ref Range   Hepatitis C Ab NON-REACTIVE NON-REACTI   SIGNAL TO CUT-OFF 0.01 <1.00  Lipid panel  Result Value Ref Range   Cholesterol 109 0 - 200 mg/dL   Triglycerides 76.0 0 - 149 mg/dL   HDL 44.00 >39.00 mg/dL   VLDL 15.2 0.0 - 40.0 mg/dL   LDL Cholesterol 50 0 - 99 mg/dL   Total CHOL/HDL Ratio 2    NonHDL 64.92   PSA  Result Value Ref Range   PSA 0.35 0.10 - 4.00 ng/mL  Uric acid  Result Value Ref Range   Uric Acid, Serum 3.9 (L) 4.0 - 7.8 mg/dL

## 2020-09-20 NOTE — Patient Instructions (Addendum)
Great to see you again today- I will be in touch with your labs asap  We will refer you to urology to check out the bump you have noticed I do recommend starting your covid 19 series asap, and a flu shot We will set up a CT scan to screen for lung cancer for you Keep up your good exercise program and healthy diet Work on smoking cessation!    Have a wonderful holiday season   Health Maintenance, Male Adopting a healthy lifestyle and getting preventive care are important in promoting health and wellness. Ask your health care provider about:  The right schedule for you to have regular tests and exams.  Things you can do on your own to prevent diseases and keep yourself healthy. What should I know about diet, weight, and exercise? Eat a healthy diet   Eat a diet that includes plenty of vegetables, fruits, low-fat dairy products, and lean protein.  Do not eat a lot of foods that are high in solid fats, added sugars, or sodium. Maintain a healthy weight Body mass index (BMI) is a measurement that can be used to identify possible weight problems. It estimates body fat based on height and weight. Your health care provider can help determine your BMI and help you achieve or maintain a healthy weight. Get regular exercise Get regular exercise. This is one of the most important things you can do for your health. Most adults should:  Exercise for at least 150 minutes each week. The exercise should increase your heart rate and make you sweat (moderate-intensity exercise).  Do strengthening exercises at least twice a week. This is in addition to the moderate-intensity exercise.  Spend less time sitting. Even light physical activity can be beneficial. Watch cholesterol and blood lipids Have your blood tested for lipids and cholesterol at 55 years of age, then have this test every 5 years. You may need to have your cholesterol levels checked more often if:  Your lipid or cholesterol levels are  high.  You are older than 55 years of age.  You are at high risk for heart disease. What should I know about cancer screening? Many types of cancers can be detected early and may often be prevented. Depending on your health history and family history, you may need to have cancer screening at various ages. This may include screening for:  Colorectal cancer.  Prostate cancer.  Skin cancer.  Lung cancer. What should I know about heart disease, diabetes, and high blood pressure? Blood pressure and heart disease  High blood pressure causes heart disease and increases the risk of stroke. This is more likely to develop in people who have high blood pressure readings, are of African descent, or are overweight.  Talk with your health care provider about your target blood pressure readings.  Have your blood pressure checked: ? Every 3-5 years if you are 45-30 years of age. ? Every year if you are 44 years old or older.  If you are between the ages of 59 and 67 and are a current or former smoker, ask your health care provider if you should have a one-time screening for abdominal aortic aneurysm (AAA). Diabetes Have regular diabetes screenings. This checks your fasting blood sugar level. Have the screening done:  Once every three years after age 13 if you are at a normal weight and have a low risk for diabetes.  More often and at a younger age if you are overweight or have a high  risk for diabetes. What should I know about preventing infection? Hepatitis B If you have a higher risk for hepatitis B, you should be screened for this virus. Talk with your health care provider to find out if you are at risk for hepatitis B infection. Hepatitis C Blood testing is recommended for:  Everyone born from [birth year] through 1966.  Anyone with known risk factors for hepatitis C. Sexually transmitted infections (STIs)  You should be screened each year for STIs, including gonorrhea and chlamydia,  if: ? You are sexually active and are younger than 55 years of age. ? You are older than 55 years of age and your health care provider tells you that you are at risk for this type of infection. ? Your sexual activity has changed since you were last screened, and you are at increased risk for chlamydia or gonorrhea. Ask your health care provider if you are at risk.  Ask your health care provider about whether you are at high risk for HIV. Your health care provider may recommend a prescription medicine to help prevent HIV infection. If you choose to take medicine to prevent HIV, you should first get tested for HIV. You should then be tested every 3 months for as long as you are taking the medicine. Follow these instructions at home: Lifestyle  Do not use any products that contain nicotine or tobacco, such as cigarettes, e-cigarettes, and chewing tobacco. If you need help quitting, ask your health care provider.  Do not use street drugs.  Do not share needles.  Ask your health care provider for help if you need support or information about quitting drugs. Alcohol use  Do not drink alcohol if your health care provider tells you not to drink.  If you drink alcohol: ? Limit how much you have to 0-2 drinks a day. ? Be aware of how much alcohol is in your drink. In the [place] one drink equals one 12 oz bottle of beer (355 mL), one 5 oz glass of wine (148 mL), or one 1 oz glass of hard liquor (44 mL). General instructions  Schedule regular health, dental, and eye exams.  Stay current with your vaccines.  Tell your health care provider if: ? You often feel depressed. ? You have ever been abused or do not feel safe at home. Summary  Adopting a healthy lifestyle and getting preventive care are important in promoting health and wellness.  Follow your health care provider's instructions about healthy diet, exercising, and getting tested or screened for diseases.  Follow your health care  provider's instructions on monitoring your cholesterol and blood pressure. This information is not intended to replace advice given to you by your health care provider. Make sure you discuss any questions you have with your health care provider. Document Revised: 10/11/2018 Document Reviewed: 10/11/2018 Elsevier Patient Education  2020 [employer].

## 2020-09-22 ENCOUNTER — Ambulatory Visit: Payer: 59 | Admitting: Family Medicine

## 2020-09-22 ENCOUNTER — Other Ambulatory Visit (HOSPITAL_BASED_OUTPATIENT_CLINIC_OR_DEPARTMENT_OTHER): Payer: Self-pay | Admitting: Internal Medicine

## 2020-09-22 ENCOUNTER — Ambulatory Visit: Payer: 59 | Attending: Internal Medicine

## 2020-09-22 ENCOUNTER — Other Ambulatory Visit: Payer: Self-pay

## 2020-09-22 ENCOUNTER — Encounter: Payer: Self-pay | Admitting: Family Medicine

## 2020-09-22 VITALS — BP 132/82 | HR 75 | Resp 16 | Ht 67.0 in | Wt 169.0 lb

## 2020-09-22 DIAGNOSIS — I10 Essential (primary) hypertension: Secondary | ICD-10-CM | POA: Diagnosis not present

## 2020-09-22 DIAGNOSIS — D72829 Elevated white blood cell count, unspecified: Secondary | ICD-10-CM

## 2020-09-22 DIAGNOSIS — Z125 Encounter for screening for malignant neoplasm of prostate: Secondary | ICD-10-CM | POA: Diagnosis not present

## 2020-09-22 DIAGNOSIS — K219 Gastro-esophageal reflux disease without esophagitis: Secondary | ICD-10-CM

## 2020-09-22 DIAGNOSIS — F17200 Nicotine dependence, unspecified, uncomplicated: Secondary | ICD-10-CM

## 2020-09-22 DIAGNOSIS — Z0000 Encounter for general adult medical examination without abnormal findings: Secondary | ICD-10-CM

## 2020-09-22 DIAGNOSIS — M109 Gout, unspecified: Secondary | ICD-10-CM | POA: Diagnosis not present

## 2020-09-22 DIAGNOSIS — E785 Hyperlipidemia, unspecified: Secondary | ICD-10-CM

## 2020-09-22 DIAGNOSIS — N529 Male erectile dysfunction, unspecified: Secondary | ICD-10-CM

## 2020-09-22 DIAGNOSIS — N5089 Other specified disorders of the male genital organs: Secondary | ICD-10-CM

## 2020-09-22 DIAGNOSIS — Z23 Encounter for immunization: Secondary | ICD-10-CM

## 2020-09-22 DIAGNOSIS — Z131 Encounter for screening for diabetes mellitus: Secondary | ICD-10-CM | POA: Diagnosis not present

## 2020-09-22 DIAGNOSIS — Z1159 Encounter for screening for other viral diseases: Secondary | ICD-10-CM

## 2020-09-22 MED ORDER — SIMVASTATIN 40 MG PO TABS: 40.0000 mg | ORAL_TABLET | Freq: Every day | ORAL | 3 refills | Status: DC

## 2020-09-22 MED ORDER — LISINOPRIL 20 MG PO TABS: 20.0000 mg | ORAL_TABLET | Freq: Every day | ORAL | 3 refills | Status: DC

## 2020-09-22 MED ORDER — ALLOPURINOL 300 MG PO TABS: 300.0000 mg | ORAL_TABLET | Freq: Every day | ORAL | 3 refills | Status: DC

## 2020-09-22 MED ORDER — OMEPRAZOLE 40 MG PO CPDR: 40.0000 mg | DELAYED_RELEASE_CAPSULE | Freq: Every day | ORAL | 3 refills | Status: DC

## 2020-09-22 MED ORDER — SILDENAFIL CITRATE 20 MG PO TABS: ORAL_TABLET | ORAL | 2 refills | Status: DC

## 2020-09-22 MED FILL — PFIZER-BIONTECH COVID-19 VA: 30 | 1 days supply | Qty: 0 | Fill #0

## 2020-09-22 NOTE — Progress Notes (Signed)
   Covid-19 Vaccination Clinic  Name:  Timothy Mcintosh    MRN: [id number] DOB: 11/08/1965  09/22/2020  [name] was observed post Covid-19 immunization for 15 minutes without incident. He was provided with Vaccine Information Sheet and instruction to access the V-Safe system.   [name] was instructed to call 911 with any severe reactions post vaccine: Marland Kitchen Difficulty breathing  . Swelling of face and throat  . A fast heartbeat  . A bad rash all over body  . Dizziness and weakness   Immunizations Administered    Name Date Dose VIS Date Route   Pfizer COVID-19 Vaccine 09/22/2020  2:20 PM 0.3 mL 08/20/2020 Intramuscular   Manufacturer: [employer], [employer]   Lot: [id number]   NDC: [address]-5     Vaccine given by Arletha Pili, pharmacy student

## 2020-09-23 ENCOUNTER — Encounter: Payer: Self-pay | Admitting: Family Medicine

## 2020-09-23 LAB — COMPREHENSIVE METABOLIC PANEL
ALT: 14 U/L (ref 0–53)
AST: 16 U/L (ref 0–37)
Albumin: 4.5 g/dL (ref 3.5–5.2)
Alkaline Phosphatase: 107 U/L (ref 39–117)
BUN: 12 mg/dL (ref 6–23)
CO2: 27 mEq/L (ref 19–32)
Calcium: 9.5 mg/dL (ref 8.4–10.5)
Chloride: 103 mEq/L (ref 96–112)
Creatinine, Ser: 0.85 mg/dL (ref 0.40–1.50)
GFR: 97.77 mL/min (ref >60.00)
Glucose, Bld: 108 mg/dL — ABNORMAL HIGH (ref 70–99)
Potassium: 4.9 mEq/L (ref 3.5–5.1)
Sodium: 137 mEq/L (ref 135–145)
Total Bilirubin: 0.4 mg/dL (ref 0.2–1.2)
Total Protein: 6.5 g/dL (ref 6.0–8.3)

## 2020-09-23 LAB — LIPID PANEL
Cholesterol: 109 mg/dL (ref 0–200)
HDL: 44 mg/dL (ref >39.00)
LDL Cholesterol: 50 mg/dL (ref 0–99)
NonHDL: 64.92
Total CHOL/HDL Ratio: 2
Triglycerides: 76 mg/dL (ref 0.0–149.0)
VLDL: 15.2 mg/dL (ref 0.0–40.0)

## 2020-09-23 LAB — CBC
HCT: 39.6 % (ref 39.0–52.0)
Hemoglobin: 13 g/dL (ref 13.0–17.0)
MCHC: 32.9 g/dL (ref 30.0–36.0)
MCV: 88.8 fl (ref 78.0–100.0)
Platelets: 292 10*3/uL (ref 150.0–400.0)
RBC: 4.46 Mil/uL (ref 4.22–5.81)
RDW: 14.2 % (ref 11.5–15.5)
WBC: 12.8 10*3/uL — ABNORMAL HIGH (ref 4.0–10.5)

## 2020-09-23 LAB — URIC ACID: Uric Acid, Serum: 3.9 mg/dL — ABNORMAL LOW (ref 4.0–7.8)

## 2020-09-23 LAB — HEPATITIS C ANTIBODY
Hepatitis C Ab: NONREACTIVE
SIGNAL TO CUT-OFF: 0.01 (ref <1.00)

## 2020-09-23 LAB — HEMOGLOBIN A1C: Hgb A1c MFr Bld: 5.7 % (ref 4.6–6.5)

## 2020-09-23 LAB — PSA: PSA: 0.35 ng/mL (ref 0.10–4.00)

## 2020-09-23 NOTE — Addendum Note (Signed)
Addended by: Abbe Amsterdam C on: 09/23/2020 06:14 PM   Modules accepted: Orders

## 2020-09-30 ENCOUNTER — Ambulatory Visit (HOSPITAL_BASED_OUTPATIENT_CLINIC_OR_DEPARTMENT_OTHER): Payer: 59

## 2020-10-03 ENCOUNTER — Ambulatory Visit (HOSPITAL_BASED_OUTPATIENT_CLINIC_OR_DEPARTMENT_OTHER)
Admission: RE | Admit: 2020-10-03 | Discharge: 2020-10-03 | Disposition: A | Discharge status: Home / Self Care | Payer: 59 | Source: Ambulatory Visit | Attending: Family Medicine | Admitting: Family Medicine

## 2020-10-03 ENCOUNTER — Other Ambulatory Visit: Payer: Self-pay

## 2020-10-03 DIAGNOSIS — F17200 Nicotine dependence, unspecified, uncomplicated: Secondary | ICD-10-CM | POA: Insufficient documentation

## 2020-10-04 ENCOUNTER — Encounter: Payer: Self-pay | Admitting: Family Medicine

## 2020-10-06 ENCOUNTER — Other Ambulatory Visit (HOSPITAL_BASED_OUTPATIENT_CLINIC_OR_DEPARTMENT_OTHER): Payer: Self-pay | Admitting: Internal Medicine

## 2020-10-06 ENCOUNTER — Ambulatory Visit: Payer: 59 | Attending: Internal Medicine

## 2020-10-06 DIAGNOSIS — Z23 Encounter for immunization: Secondary | ICD-10-CM

## 2020-10-06 NOTE — Progress Notes (Signed)
   Covid-19 Vaccination Clinic  Name:  TANIELA FELTUS    MRN: [id number] DOB: [birth year]  10/06/2020  Mr. Bartol was observed post Covid-19 immunization for 15 minutes without incident. He was provided with Vaccine Information Sheet and instruction to access the V-Safe system.   Mr. Rozario was instructed to call 911 with any severe reactions post vaccine: Marland Kitchen Difficulty breathing  . Swelling of face and throat  . A fast heartbeat  . A bad rash all over body  . Dizziness and weakness   Immunizations Administered    Name Date Dose VIS Date Route   Pfizer COVID-19 Vaccine 10/06/2020  9:54 AM 0.3 mL 08/20/2020 Intramuscular   Manufacturer: [employer], [employer]   Lot: [id number]   NDC: [address]-8

## 2020-10-14 MED FILL — PFIZER-BIONTECH COVID-19 VA: 30 | 1 days supply | Qty: 0 | Fill #0

## 2020-12-02 ENCOUNTER — Encounter: Payer: Self-pay | Admitting: Family Medicine

## 2021-02-09 ENCOUNTER — Other Ambulatory Visit: Payer: Self-pay | Admitting: Family Medicine

## 2021-02-09 DIAGNOSIS — F5101 Primary insomnia: Secondary | ICD-10-CM

## 2021-03-10 ENCOUNTER — Other Ambulatory Visit: Payer: Self-pay | Admitting: Family Medicine

## 2021-03-10 DIAGNOSIS — F5101 Primary insomnia: Secondary | ICD-10-CM

## 2021-04-05 ENCOUNTER — Other Ambulatory Visit: Payer: Self-pay | Admitting: Family Medicine

## 2021-04-05 DIAGNOSIS — F5101 Primary insomnia: Secondary | ICD-10-CM

## 2021-05-03 ENCOUNTER — Other Ambulatory Visit: Payer: Self-pay | Admitting: Family Medicine

## 2021-05-03 DIAGNOSIS — F5101 Primary insomnia: Secondary | ICD-10-CM

## 2021-06-09 ENCOUNTER — Other Ambulatory Visit: Payer: Self-pay | Admitting: *Deleted

## 2021-06-09 DIAGNOSIS — F5101 Primary insomnia: Secondary | ICD-10-CM

## 2021-06-09 MED ORDER — TRAZODONE HCL 100 MG PO TABS: 100.0000 mg | ORAL_TABLET | Freq: Every day | ORAL | 0 refills | Status: DC

## 2021-08-31 ENCOUNTER — Other Ambulatory Visit: Payer: Self-pay | Admitting: Family Medicine

## 2021-08-31 DIAGNOSIS — F5101 Primary insomnia: Secondary | ICD-10-CM

## 2021-09-28 ENCOUNTER — Other Ambulatory Visit: Payer: Self-pay | Admitting: Family Medicine

## 2021-09-28 ENCOUNTER — Encounter: Payer: Self-pay | Admitting: Family Medicine

## 2021-09-28 DIAGNOSIS — F5101 Primary insomnia: Secondary | ICD-10-CM

## 2021-09-29 NOTE — Progress Notes (Addendum)
[facility] [address], [place], [place] [address] [phone]  Date:  10/01/2021   Name:  YOUSAF Mcintosh   DOB:  08-17-1966   MRN:  [id number]  PCP:  Darreld Mclean, MD    Chief Complaint: Follow-up (Concerns/ questions: none/Flu shot today: maybe)   History of Present Illness:  Timothy Mcintosh is a 55 y.o. 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 Amil Amen- he is not currently under any treatment as nothing seemed to work  He has 3 daughters and 7 grandchildren- range from 33 to 63 years old   Smoker, qualifies for lung cancer screening-now due for repeat Can update labs today  We had to change his urology referral earlier this year due to [facility] urology not being in network He was seen at [facility] in [place] by Dr. Abner Greenspan, it appears evaluation was reassuring.  They checked on a scrotal concern for him, this turned out to be scarring from vasectomy.  They are not checking PSA  Shingrix- discussed today. He has thought about doing this. Declines for now  Pneumonia booster-declines today COVID-19 booster-recommended Flu shot- give today  Colonoscopy is up-to-date  Allopurinol Lisinopril 20 Prilosec 40 Simvastatin Trazodone at bedtime Revatio as needed Suboxone per outside provider  He does continue to smoke Otherwise he is feeling great, he is very physically active.  Denies any chest pain or shortness of breath Patient Active Problem List   Diagnosis Date Noted   Status post total hip replacement, left 12/14/2019   OA (osteoarthritis) of knee 09/04/2019   Primary  osteoarthritis of left hip 09/04/2019   Left lower quadrant abdominal pain 04/30/1600   Monoallelic mutation of PALB2 gene 05/31/2018   Genetic testing 05/31/2018   Hypertension 05/16/2018   Left elbow pain 05/16/2018   Family history of genetic disease carrier    Family history of breast cancer    Family history of lung cancer    Family history of throat cancer    Post concussion syndrome 09/15/2017   Restless leg syndrome 09/15/2017   Rheumatoid arthritis ([place]) 04/01/2016   Dyslipidemia 12/25/2015   Tobacco use disorder 12/25/2015   Insomnia 12/25/2015   Primary osteoarthritis of left knee 07/30/2015   GERD (gastroesophageal reflux disease) 03/04/2014   Major depression 03/04/2014   Asthmatic bronchitis 09/19/2013   ED (erectile dysfunction) 04/19/2013   Dorsalgia 11/21/2011   Hyperlipidemia 11/21/2011   Gout 11/21/2011   GAD (generalized anxiety disorder) 11/21/2011    Past Medical History:  Diagnosis Date   Family history of breast cancer    Family history of genetic disease carrier    sister PALB2+   Family history of lung cancer    Family history of throat cancer    Headache    Hyperlipidemia    Hypertension    Rheumatoid arthritis ([place])    Vision abnormalities     Past Surgical History:  Procedure Laterality Date   BACK SURGERY Bilateral    c5 -c6   CARPAL TUNNEL RELEASE Right    KNEE SURGERY Bilateral    TOTAL HIP ARTHROPLASTY Left 12/14/2019   Procedure: TOTAL HIP ARTHROPLASTY ANTERIOR  APPROACH;  Surgeon: Dorna Leitz, MD;  Location: [facility];  Service: Orthopedics;  Laterality: Left;    Social History   Tobacco Use   Smoking status: Every Day   Smokeless tobacco: Never   Tobacco comments:    smoking since age 39  Vaping Use   Vaping Use: Never used  Substance Use Topics   Alcohol use: No    Alcohol/week: 0.0 standard drinks   Drug use: No    Family History  Problem Relation Age of Onset   High Cholesterol Father    Kidney Stones Father     Heart failure Maternal Grandfather        Diet age 24 of "massive heart failure" unknown if MI or not   Lung cancer Mother    Breast cancer Mother 30   Kidney cancer Mother    Skin cancer Mother    Breast cancer Sister 68       PALB2+   Breast cancer Paternal Grandfather        64's   Throat cancer Paternal Grandfather    Other Sister        PALB2+   Throat cancer Other    Throat cancer Other     No Known Allergies  Medication list has been reviewed and updated.  Current Outpatient Medications on File Prior to Visit  Medication Sig Dispense Refill   allopurinol (ZYLOPRIM) 300 MG tablet Take 1 tablet (300 mg total) by mouth daily. 90 tablet 3   lisinopril (ZESTRIL) 20 MG tablet Take 1 tablet (20 mg total) by mouth daily. 90 tablet 3   Multiple Vitamin (MULTIVITAMIN) capsule Take 1 capsule by mouth daily.      omeprazole (PRILOSEC) 40 MG capsule Take 1 capsule (40 mg total) by mouth daily. 90 capsule 3   sildenafil (REVATIO) 20 MG tablet TAKE 1 TO 5 TABLETS BY MOUTH DAILY AS NEEDED FOR ERECTILE DYSFUNCTION 50 tablet 2   simvastatin (ZOCOR) 40 MG tablet Take 1 tablet (40 mg total) by mouth daily. 90 tablet 3   SUBOXONE 8-2 MG FILM Place 1 Film under the tongue 2 (two) times daily.   0   traZODone (DESYREL) 100 MG tablet TAKE 1 TABLET BY MOUTH AT BEDTIME 30 tablet 0   No current facility-administered medications on file prior to visit.    Review of Systems:  As per HPI- otherwise negative.   Physical Examination: Vitals:   10/01/21 1355  BP: 120/70  Pulse: 79  Resp: 18  Temp: 98.5 F (36.9 C)  SpO2: 98%   Vitals:   10/01/21 1355  Weight: 163 lb 9.6 oz (74.2 kg)  Height: 5' 7"  (1.702 m)   Body mass index is 25.62 kg/m. Ideal Body Weight: Weight in (lb) to have BMI = 25: 159.3  GEN: no acute distress.  Looks well, normal weight.  Smoker HEENT: Atraumatic, Normocephalic.  Bilateral TM wnl, oropharynx normal.  PEERL,EOMI.   Ears and Nose: No external  deformity. CV: RRR, No M/G/R. No JVD. No thrill. No extra heart sounds. PULM: CTA B, no wheezes, crackles, rhonchi. No retractions. No resp. distress. No accessory muscle use. ABD: S, NT, ND, +BS. No rebound. No HSM. EXTR: No c/c/e PSYCH: Normally interactive. Conversant.    Assessment and Plan: Tobacco use disorder - Plan: CT CHEST LUNG CA SCREEN LOW DOSE W/O CM  Dyslipidemia - Plan: Lipid panel, simvastatin (ZOCOR) 40 MG tablet  Screening for prostate cancer - Plan: PSA  Screening for diabetes mellitus - Plan: Comprehensive  metabolic panel, Hemoglobin A1c  Gout, unspecified cause, unspecified chronicity, unspecified site - Plan: allopurinol (ZYLOPRIM) 300 MG tablet  Essential hypertension - Plan: CBC, Comprehensive metabolic panel  Accelerated hypertension - Plan: lisinopril (ZESTRIL) 20 MG tablet  Gastroesophageal reflux disease - Plan: omeprazole (PRILOSEC) 40 MG capsule  Primary insomnia - Plan: traZODone (DESYREL) 100 MG tablet  Need for influenza vaccination - Plan: Flu Vaccine QUAD 6+ mos PF IM (Fluarix Quad PF)  Patient seen today for routine follow-up.  Ordered annual lung cancer screening CT Blood pressure under good control, medications refilled Using trazodone for insomnia with success, refilled Gave flu shot, discussed other indicated immunizations No gout symptoms, refill allopurinol  Will plan further follow- up pending labs.   Signed Lamar Blinks, MD  Received labs 12/2- message to pt  Results for orders placed or performed in visit on 10/01/21  CBC  Result Value Ref Range   WBC 8.8 4.0 - 10.5 K/uL   RBC 4.31 4.22 - 5.81 Mil/uL   Platelets 279.0 150.0 - 400.0 K/uL   Hemoglobin 13.0 13.0 - 17.0 g/dL   HCT 38.7 (L) 39.0 - 52.0 %   MCV 89.9 78.0 - 100.0 fl   MCHC 33.6 30.0 - 36.0 g/dL   RDW 13.6 11.5 - 15.5 %  Comprehensive metabolic panel  Result Value Ref Range   Sodium 136 135 - 145 mEq/L   Potassium 5.2 No hemolysis seen (H) 3.5 - 5.1  mEq/L   Chloride 101 96 - 112 mEq/L   CO2 28 19 - 32 mEq/L   Glucose, Bld 102 (H) 70 - 99 mg/dL   BUN 18 6 - 23 mg/dL   Creatinine, Ser 0.91 0.40 - 1.50 mg/dL   Total Bilirubin 0.3 0.2 - 1.2 mg/dL   Alkaline Phosphatase 114 39 - 117 U/L   AST 17 0 - 37 U/L   ALT 16 0 - 53 U/L   Total Protein 6.7 6.0 - 8.3 g/dL   Albumin 4.7 3.5 - 5.2 g/dL   GFR 94.15 >60.00 mL/min   Calcium 9.9 8.4 - 10.5 mg/dL  Hemoglobin A1c  Result Value Ref Range   Hgb A1c MFr Bld 5.6 4.6 - 6.5 %  Lipid panel  Result Value Ref Range   Cholesterol 125 0 - 200 mg/dL   Triglycerides 83.0 0.0 - 149.0 mg/dL   HDL 45.70 >39.00 mg/dL   VLDL 16.6 0.0 - 40.0 mg/dL   LDL Cholesterol 63 0 - 99 mg/dL   Total CHOL/HDL Ratio 3    NonHDL 79.22   PSA  Result Value Ref Range   PSA 0.38 0.10 - 4.00 ng/mL

## 2021-09-29 NOTE — Patient Instructions (Addendum)
It was good to see you again today, I will be in touch with your labs and we will set up your CT scan for lung cancer screening  Flu shot today Please get the latest covid booster at your convenience Consider getting the shingles vaccine series soon  We can also do a pneumonia shot the next time we see you  Happy holidays!

## 2021-10-01 ENCOUNTER — Encounter: Payer: Self-pay | Admitting: Family Medicine

## 2021-10-01 ENCOUNTER — Ambulatory Visit (INDEPENDENT_AMBULATORY_CARE_PROVIDER_SITE_OTHER): Payer: BLUE CROSS/BLUE SHIELD | Admitting: Family Medicine

## 2021-10-01 VITALS — BP 120/70 | HR 79 | Temp 98.5°F | Resp 18 | Ht 67.0 in | Wt 163.6 lb

## 2021-10-01 DIAGNOSIS — I10 Essential (primary) hypertension: Secondary | ICD-10-CM | POA: Diagnosis not present

## 2021-10-01 DIAGNOSIS — F5101 Primary insomnia: Secondary | ICD-10-CM

## 2021-10-01 DIAGNOSIS — Z131 Encounter for screening for diabetes mellitus: Secondary | ICD-10-CM | POA: Diagnosis not present

## 2021-10-01 DIAGNOSIS — F17200 Nicotine dependence, unspecified, uncomplicated: Secondary | ICD-10-CM

## 2021-10-01 DIAGNOSIS — E785 Hyperlipidemia, unspecified: Secondary | ICD-10-CM

## 2021-10-01 DIAGNOSIS — Z125 Encounter for screening for malignant neoplasm of prostate: Secondary | ICD-10-CM

## 2021-10-01 DIAGNOSIS — Z23 Encounter for immunization: Secondary | ICD-10-CM

## 2021-10-01 DIAGNOSIS — K219 Gastro-esophageal reflux disease without esophagitis: Secondary | ICD-10-CM

## 2021-10-01 DIAGNOSIS — E875 Hyperkalemia: Secondary | ICD-10-CM

## 2021-10-01 DIAGNOSIS — M109 Gout, unspecified: Secondary | ICD-10-CM

## 2021-10-01 MED ORDER — SIMVASTATIN 40 MG PO TABS: 40.0000 mg | ORAL_TABLET | Freq: Every day | ORAL | 3 refills | Status: DC

## 2021-10-01 MED ORDER — LISINOPRIL 20 MG PO TABS: 20.0000 mg | ORAL_TABLET | Freq: Every day | ORAL | 3 refills | Status: DC

## 2021-10-01 MED ORDER — TRAZODONE HCL 100 MG PO TABS: 100.0000 mg | ORAL_TABLET | Freq: Every day | ORAL | 3 refills | Status: DC

## 2021-10-01 MED ORDER — ALLOPURINOL 300 MG PO TABS: 300.0000 mg | ORAL_TABLET | Freq: Every day | ORAL | 3 refills | Status: DC

## 2021-10-01 MED ORDER — OMEPRAZOLE 40 MG PO CPDR
40.0000 mg | DELAYED_RELEASE_CAPSULE | Freq: Every day | ORAL | 3 refills | Status: DC
Start: 2021-10-01 — End: 2021-10-14

## 2021-10-02 ENCOUNTER — Encounter: Payer: Self-pay | Admitting: Family Medicine

## 2021-10-02 LAB — CBC
HCT: 38.7 % — ABNORMAL LOW (ref 39.0–52.0)
Hemoglobin: 13 g/dL (ref 13.0–17.0)
MCHC: 33.6 g/dL (ref 30.0–36.0)
MCV: 89.9 fl (ref 78.0–100.0)
Platelets: 279 10*3/uL (ref 150.0–400.0)
RBC: 4.31 Mil/uL (ref 4.22–5.81)
RDW: 13.6 % (ref 11.5–15.5)
WBC: 8.8 10*3/uL (ref 4.0–10.5)

## 2021-10-02 LAB — COMPREHENSIVE METABOLIC PANEL
ALT: 16 U/L (ref 0–53)
AST: 17 U/L (ref 0–37)
Albumin: 4.7 g/dL (ref 3.5–5.2)
Alkaline Phosphatase: 114 U/L (ref 39–117)
BUN: 18 mg/dL (ref 6–23)
CO2: 28 mEq/L (ref 19–32)
Calcium: 9.9 mg/dL (ref 8.4–10.5)
Chloride: 101 mEq/L (ref 96–112)
Creatinine, Ser: 0.91 mg/dL (ref 0.40–1.50)
GFR: 94.15 mL/min (ref >60.00)
Glucose, Bld: 102 mg/dL — ABNORMAL HIGH (ref 70–99)
Potassium: 5.2 mEq/L — ABNORMAL HIGH (ref 3.5–5.1)
Sodium: 136 mEq/L (ref 135–145)
Total Bilirubin: 0.3 mg/dL (ref 0.2–1.2)
Total Protein: 6.7 g/dL (ref 6.0–8.3)

## 2021-10-02 LAB — LIPID PANEL
Cholesterol: 125 mg/dL (ref 0–200)
HDL: 45.7 mg/dL (ref >39.00)
LDL Cholesterol: 63 mg/dL (ref 0–99)
NonHDL: 79.22
Total CHOL/HDL Ratio: 3
Triglycerides: 83 mg/dL (ref 0.0–149.0)
VLDL: 16.6 mg/dL (ref 0.0–40.0)

## 2021-10-02 LAB — PSA: PSA: 0.38 ng/mL (ref 0.10–4.00)

## 2021-10-02 LAB — HEMOGLOBIN A1C: Hgb A1c MFr Bld: 5.6 % (ref 4.6–6.5)

## 2021-10-02 NOTE — Addendum Note (Signed)
Addended by: Abbe Amsterdam C on: 10/02/2021 05:33 PM   Modules accepted: Orders

## 2021-10-08 ENCOUNTER — Ambulatory Visit (HOSPITAL_BASED_OUTPATIENT_CLINIC_OR_DEPARTMENT_OTHER)
Admission: RE | Admit: 2021-10-08 | Discharge: 2021-10-08 | Disposition: A | Discharge status: Home / Self Care | Payer: BC Managed Care – PPO | Source: Ambulatory Visit | Attending: Family Medicine | Admitting: Family Medicine

## 2021-10-08 ENCOUNTER — Other Ambulatory Visit: Payer: Self-pay

## 2021-10-08 DIAGNOSIS — F17200 Nicotine dependence, unspecified, uncomplicated: Secondary | ICD-10-CM | POA: Insufficient documentation

## 2021-10-08 DIAGNOSIS — F17210 Nicotine dependence, cigarettes, uncomplicated: Secondary | ICD-10-CM | POA: Diagnosis not present

## 2021-10-09 ENCOUNTER — Encounter: Payer: Self-pay | Admitting: Family Medicine

## 2021-10-13 ENCOUNTER — Telehealth: Payer: Self-pay | Admitting: Family Medicine

## 2021-10-13 DIAGNOSIS — K219 Gastro-esophageal reflux disease without esophagitis: Secondary | ICD-10-CM

## 2021-10-13 DIAGNOSIS — F5101 Primary insomnia: Secondary | ICD-10-CM

## 2021-10-13 DIAGNOSIS — M109 Gout, unspecified: Secondary | ICD-10-CM

## 2021-10-13 NOTE — Telephone Encounter (Signed)
Pt switched pharmacies  Medication:  traZODone (DESYREL) 100 MG tablet  omeprazole (PRILOSEC) 40 MG capsule  allopurinol (ZYLOPRIM) 300 MG tablet   Has the patient contacted their pharmacy? No. (If no, request that the patient contact the pharmacy for the refill.) (If yes, when and what did the pharmacy advise?)  Preferred Pharmacy (with phone number or street name):   [address] [place], [place], [place] [address]  [phone] Fax: [phone]  Agent: Please be advised that RX refills may take up to 3 business days. We ask that you follow-up with your pharmacy.

## 2021-10-14 MED ORDER — ALLOPURINOL 300 MG PO TABS: 300.0000 mg | ORAL_TABLET | Freq: Every day | ORAL | 3 refills | Status: DC

## 2021-10-14 MED ORDER — OMEPRAZOLE 40 MG PO CPDR: 40.0000 mg | DELAYED_RELEASE_CAPSULE | Freq: Every day | ORAL | 3 refills | Status: DC

## 2021-10-14 MED ORDER — TRAZODONE HCL 100 MG PO TABS: 100.0000 mg | ORAL_TABLET | Freq: Every day | ORAL | 3 refills | Status: DC

## 2021-10-14 NOTE — Telephone Encounter (Signed)
Left message to call back to verify pharmacy before we send in or patient can called the pharmacy that he would like to transfer to call [facility] and they will get it transferred.

## 2021-10-14 NOTE — Telephone Encounter (Signed)
Patient called back and he stated that the pharmacy is [facility].  Rxs sent in.

## 2021-11-17 DIAGNOSIS — G8929 Other chronic pain: Secondary | ICD-10-CM | POA: Diagnosis not present

## 2021-11-20 ENCOUNTER — Telehealth (INDEPENDENT_AMBULATORY_CARE_PROVIDER_SITE_OTHER): Payer: BC Managed Care – PPO | Admitting: Family Medicine

## 2021-11-20 ENCOUNTER — Encounter: Payer: Self-pay | Admitting: Family Medicine

## 2021-11-20 VITALS — BP 115/75 | Temp 100.5°F | Ht 67.0 in | Wt 163.0 lb

## 2021-11-20 DIAGNOSIS — J4520 Mild intermittent asthma, uncomplicated: Secondary | ICD-10-CM

## 2021-11-20 DIAGNOSIS — U071 COVID-19: Secondary | ICD-10-CM

## 2021-11-20 HISTORY — DX: COVID-19: U07.1

## 2021-11-20 MED ORDER — PREDNISONE 10 MG PO TABS: 10.0000 mg | ORAL_TABLET | Freq: Two times a day (BID) | ORAL | 0 refills | Status: AC

## 2021-11-20 MED ORDER — NIRMATRELVIR/RITONAVIR (PAXLOVID)TABLET: 3.0000 | ORAL_TABLET | Freq: Two times a day (BID) | ORAL | 0 refills | Status: AC

## 2021-11-20 MED ORDER — NIRMATRELVIR/RITONAVIR (PAXLOVID)TABLET: 3.0000 | ORAL_TABLET | Freq: Two times a day (BID) | ORAL | 0 refills | Status: DC

## 2021-11-20 NOTE — Progress Notes (Signed)
Established Patient Office Visit  Subjective:  Patient ID: Timothy Mcintosh, male    DOB: 1966/03/11  Age: 56 y.o. MRN: [id number]  CC:  Chief Complaint  Patient presents with   Covid Positive    Body aches, fever just not feeling well started yesterday. Positive for COVID today.     HPI Timothy Mcintosh presents for evaluation of 1 day history of URI signs and symptoms with malaise, fatigue, headache, stuffy nose with postnasal drip, sore throat, cough with mild tightness in the chest.  He is also having myalgias and arthralgias.  He has no history of asthma.  He does smoke.  He has had 2 COVID vaccines.  He has experienced fever and chills.  Home COVID test was positive this morning.  Past Medical History:  Diagnosis Date   Family history of breast cancer    Family history of genetic disease carrier    sister PALB2+   Family history of lung cancer    Family history of throat cancer    Headache    Hyperlipidemia    Hypertension    Rheumatoid arthritis ([place])    Vision abnormalities     Past Surgical History:  Procedure Laterality Date   BACK SURGERY Bilateral    c5 -c6   CARPAL TUNNEL RELEASE Right    KNEE SURGERY Bilateral    TOTAL HIP ARTHROPLASTY Left 12/14/2019   Procedure: TOTAL HIP ARTHROPLASTY ANTERIOR APPROACH;  Surgeon: Dorna Leitz, MD;  Location: [facility];  Service: Orthopedics;  Laterality: Left;    Family History  Problem Relation Age of Onset   High Cholesterol Father    Kidney Stones Father    Heart failure Maternal Grandfather        Diet age 35 of "massive heart failure" unknown if MI or not   Lung cancer Mother    Breast cancer Mother 21   Kidney cancer Mother    Skin cancer Mother    Breast cancer Sister 34       PALB2+   Breast cancer Paternal Grandfather        24's   Throat cancer Paternal Grandfather    Other Sister        PALB2+   Throat cancer Other    Throat cancer Other     Social History   Socioeconomic History   Marital status:  Married    Spouse name: Not on file   Number of children: Not on file   Years of education: Not on file   Highest education level: Not on file  Occupational History   Not on file  Tobacco Use   Smoking status: Every Day   Smokeless tobacco: Never   Tobacco comments:    smoking since age 51  Vaping Use   Vaping Use: Never used  Substance and Sexual Activity   Alcohol use: No    Alcohol/week: 0.0 standard drinks   Drug use: No   Sexual activity: Not on file  Other Topics Concern   Not on file  Social History Narrative   Not on file   Social Determinants of Health   Financial Resource Strain: Not on file  Food Insecurity: Not on file  Transportation Needs: Not on file  Physical Activity: Not on file  Stress: Not on file  Social Connections: Not on file  Intimate Partner Violence: Not on file    Outpatient Medications Prior to Visit  Medication Sig Dispense Refill   allopurinol (ZYLOPRIM) 300 MG tablet Take  1 tablet (300 mg total) by mouth daily. 90 tablet 3   lisinopril (ZESTRIL) 20 MG tablet Take 1 tablet (20 mg total) by mouth daily. 90 tablet 3   Multiple Vitamin (MULTIVITAMIN) capsule Take 1 capsule by mouth daily.      omeprazole (PRILOSEC) 40 MG capsule Take 1 capsule (40 mg total) by mouth daily. 90 capsule 3   sildenafil (REVATIO) 20 MG tablet TAKE 1 TO 5 TABLETS BY MOUTH DAILY AS NEEDED FOR ERECTILE DYSFUNCTION 50 tablet 2   simvastatin (ZOCOR) 40 MG tablet Take 1 tablet (40 mg total) by mouth daily. 90 tablet 3   SUBOXONE 8-2 MG FILM Place 1 Film under the tongue 2 (two) times daily.   0   traZODone (DESYREL) 100 MG tablet Take 1 tablet (100 mg total) by mouth at bedtime. 90 tablet 3   No facility-administered medications prior to visit.    No Known Allergies  ROS Review of Systems  Constitutional:  Positive for chills, fatigue and fever. Negative for diaphoresis and unexpected weight change.  HENT:  Positive for congestion, postnasal drip and sore throat.    Eyes:  Negative for photophobia and visual disturbance.  Respiratory:  Positive for cough and chest tightness. Negative for shortness of breath and wheezing.   Cardiovascular: Negative.   Gastrointestinal:  Negative for abdominal pain, nausea and vomiting.  Musculoskeletal:  Positive for arthralgias.  Neurological:  Positive for headaches.     Objective:    Physical Exam Vitals and nursing note reviewed.  Constitutional:      General: He is not in acute distress.    Appearance: Normal appearance. He is not ill-appearing, toxic-appearing or diaphoretic.  HENT:     Head: Normocephalic and atraumatic.     Right Ear: External ear normal.     Left Ear: External ear normal.  Eyes:     General: No scleral icterus.       Right eye: No discharge.        Left eye: No discharge.     Extraocular Movements: Extraocular movements intact.     Conjunctiva/sclera: Conjunctivae normal.  Pulmonary:     Effort: Pulmonary effort is normal.  Neurological:     Mental Status: He is alert and oriented to person, place, and time.  Psychiatric:        Mood and Affect: Mood normal.        Behavior: Behavior normal.    BP 115/75    Temp (!) 100.5 F (38.1 C) (Oral)    Ht _0  (1.702 m)    Wt 163 lb (73.9 kg)    BMI 25.53 kg/m  Wt Readings from Last 3 Encounters:  11/20/21 163 lb (73.9 kg)  10/01/21 163 lb 9.6 oz (74.2 kg)  09/22/20 169 lb (76.7 kg)     Health Maintenance Due  Topic Date Due   Zoster Vaccines- Shingrix (1 of 2) Never done    There are no preventive care reminders to display for this patient.  No results found for: TSH Lab Results  Component Value Date   WBC 8.8 10/01/2021   HGB 13.0 10/01/2021   HCT 38.7 (L) 10/01/2021   MCV 89.9 10/01/2021   PLT 279.0 10/01/2021   Lab Results  Component Value Date   NA 136 10/01/2021   K 5.2 No hemolysis seen (H) 10/01/2021   CO2 28 10/01/2021   GLUCOSE 102 (H) 10/01/2021   BUN 18 10/01/2021   CREATININE 0.91 10/01/2021    BILITOT 0.3  10/01/2021   ALKPHOS 114 10/01/2021   AST 17 10/01/2021   ALT 16 10/01/2021   PROT 6.7 10/01/2021   ALBUMIN 4.7 10/01/2021   CALCIUM 9.9 10/01/2021   ANIONGAP 10 12/11/2019   GFR 94.15 10/01/2021   Lab Results  Component Value Date   CHOL 125 10/01/2021   Lab Results  Component Value Date   HDL 45.70 10/01/2021   Lab Results  Component Value Date   LDLCALC 63 10/01/2021   Lab Results  Component Value Date   TRIG 83.0 10/01/2021   Lab Results  Component Value Date   CHOLHDL 3 10/01/2021   Lab Results  Component Value Date   HGBA1C 5.6 10/01/2021      Assessment & Plan:   Problem List Items Addressed This Visit       Respiratory   Reactive airway disease   Relevant Medications   predniSONE (DELTASONE) 10 MG tablet     Other   COVID-19 - Primary   Relevant Medications   nirmatrelvir/ritonavir EUA (PAXLOVID) 20 x 150 MG & 10 x 100MG TABS    Meds ordered this encounter  Medications   DISCONTD: nirmatrelvir/ritonavir EUA (PAXLOVID) 20 x 150 MG & 10 x 100MG TABS    Sig: Take 3 tablets by mouth 2 (two) times daily for 5 days. (Take nirmatrelvir 150 mg two tablets twice daily for 5 days and ritonavir 100 mg one tablet twice daily for 5 days) Patient GFR is 94  Hold simvastatin and sildenafil while taking this medication.    Dispense:  30 tablet    Refill:  0   predniSONE (DELTASONE) 10 MG tablet    Sig: Take 1 tablet (10 mg total) by mouth 2 (two) times daily with a meal for 5 days.    Dispense:  10 tablet    Refill:  0   nirmatrelvir/ritonavir EUA (PAXLOVID) 20 x 150 MG & 10 x 100MG TABS    Sig: Take 3 tablets by mouth 2 (two) times daily for 5 days. (Take nirmatrelvir 150 mg two tablets twice daily for 5 days and ritonavir 100 mg one tablet twice daily for 5 days) Patient GFR is 94  Hold simvastatin and sildenafil while taking this medication.    Dispense:  30 tablet    Refill:  0    Follow-up: Return if symptoms worsen or fail to improve.    Given patient's health history believe that he would be a candidate for treatment with an antithyroid medication.  Recent renal function is normal.  I have chosen Paxlovid.   Advised that this is an experimental drug but seems to be helping a lot of people and preventing hospitalizations.  Instructions include that he hold simvastatin and sildenafil while taking it.  Low-dose steroid therapy to address risk of progression of reactive airway disease. Libby Maw, MD  Virtual Visit via Video Note  I connected with Timothy Mcintosh on 11/20/21 at  1:00 PM EST by a video enabled telemedicine application and verified that I am speaking with the correct person using two identifiers.  Location: Patient: home alone.  There is not anyone else there while he is not very reliable Provider: work   I discussed the limitations of evaluation and management by telemedicine and the availability of in person appointments. The patient expressed understanding and agreed to proceed.  History of Present Illness:    Observations/Objective:   Assessment and Plan:   Follow Up Instructions:    I discussed the assessment and  treatment plan with the patient. The patient was provided an opportunity to ask questions and all were answered. The patient agreed with the plan and demonstrated an understanding of the instructions.   The patient was advised to call back or seek an in-person evaluation if the symptoms worsen or if the condition fails to improve as anticipated.  I provided 20 minutes of non-face-to-face time during this encounter.   Libby Maw, MD

## 2021-11-20 NOTE — Telephone Encounter (Signed)
Pt scheduled for VV with Dr. Doreene Burke at [place] at 1 pm today.

## 2021-12-15 ENCOUNTER — Encounter: Payer: Self-pay | Admitting: Family Medicine

## 2021-12-15 DIAGNOSIS — N529 Male erectile dysfunction, unspecified: Secondary | ICD-10-CM

## 2021-12-16 MED ORDER — SILDENAFIL CITRATE 20 MG PO TABS: ORAL_TABLET | ORAL | 2 refills | Status: DC

## 2021-12-18 ENCOUNTER — Encounter: Payer: Self-pay | Admitting: Family Medicine

## 2021-12-18 DIAGNOSIS — M79672 Pain in left foot: Secondary | ICD-10-CM

## 2021-12-18 DIAGNOSIS — M79671 Pain in right foot: Secondary | ICD-10-CM

## 2021-12-24 ENCOUNTER — Ambulatory Visit (INDEPENDENT_AMBULATORY_CARE_PROVIDER_SITE_OTHER): Payer: BC Managed Care – PPO

## 2021-12-24 ENCOUNTER — Ambulatory Visit: Payer: BC Managed Care – PPO | Admitting: Podiatry

## 2021-12-24 ENCOUNTER — Other Ambulatory Visit: Payer: Self-pay

## 2021-12-24 ENCOUNTER — Encounter: Payer: Self-pay | Admitting: Podiatry

## 2021-12-24 DIAGNOSIS — M722 Plantar fascial fibromatosis: Secondary | ICD-10-CM | POA: Diagnosis not present

## 2021-12-24 DIAGNOSIS — M79672 Pain in left foot: Secondary | ICD-10-CM

## 2021-12-24 DIAGNOSIS — M79671 Pain in right foot: Secondary | ICD-10-CM | POA: Diagnosis not present

## 2021-12-24 DIAGNOSIS — M7672 Peroneal tendinitis, left leg: Secondary | ICD-10-CM

## 2021-12-24 MED ORDER — DEXAMETHASONE SODIUM PHOSPHATE 120 MG/30ML IJ SOLN
4.0000 mg | Freq: Once | INTRAMUSCULAR | Status: AC
  Administered 2021-12-24: 4 mg via INTRA_ARTICULAR

## 2021-12-24 MED ORDER — MELOXICAM 15 MG PO TABS: 15.0000 mg | ORAL_TABLET | Freq: Every day | ORAL | 0 refills | Status: DC

## 2021-12-24 NOTE — Progress Notes (Signed)
Subjective:  °Patient ID: Gaylan D Storlie, male    DOB: 12/04/1965,   MRN: [id number] ° °Chief Complaint  °Patient presents with  ° Foot Pain  °  Bilateral foot pain left foot is more painful than the right foot  ° ° °56 y.o. male presents for bilateral foot pain. Relates the left is worse than right and has pain on the outside of his left foot for past 3 months. Relates sharp shooting pain that is constant and affect his gait. Works for 12 hours a day on his feet. Also relates right arch pain that is painful when pressure is applied . Denies any other pedal complaints. Denies n/v/f/c.  ° °Past Medical History:  °Diagnosis Date  ° Family history of breast cancer   ° Family history of genetic disease carrier   ° sister PALB2+  ° Family history of lung cancer   ° Family history of throat cancer   ° Headache   ° Hyperlipidemia   ° Hypertension   ° Rheumatoid arthritis ([facility])   ° Vision abnormalities   ° ° °Objective:  °Physical Exam: °Vascular: DP/PT pulses 2/4 bilateral. CFT <3 seconds. Normal hair growth on digits. No edema.  °Skin. No lacerations or abrasions bilateral feet.  °Musculoskeletal: MMT 5/5 bilateral lower extremities in DF, PF, Inversion and Eversion. Deceased ROM in DF of ankle joint. Pain on left side to insertion of peroneal tendon. Mild tenderness proximally along the tendon. Pain to medial calcaneal tubercle on the right foot.  °Neurological: Sensation intact to light touch.  ° °Assessment:  ° °1. Peroneal tendonitis, left   °2. Plantar fasciitis, right   ° ° ° °Plan:  °Patient was evaluated and treated and all questions answered. °X-rays reviewed and discussed with patient. No acute fractures or dislocations noted. Bilateral plantar and posterior spurring of the calcaneus noted.  °Discussed peroneal tendinitis and plantar fasciitis  and treatment options at length with patient °Discussed stretching exercises and provided handout. °Prescription for meloxicam provided °Dispensed Tri-Lock ankle brace  and plantar fascia brace for right foot.  °Requesting injection of tendon today. Procedure below. °Discussed that if the symptoms do not improve can consider PT/MRI. °Patient to return in 6 to 8 weeks or sooner if symptoms fail to improve or worsen. ° °Procedure: Injection Tendon/Ligament °Discussed alternatives, risks, complications and verbal consent was obtained.  °Location: Left peroneal tendon . °Skin Prep: Alcohol. °Injectate: 1cc 0.5% marcaine plain, 1 cc dexamethasone.  °Disposition: Patient tolerated procedure well. Injection site dressed with a band-aid.  °Post-injection care was discussed and return precautions discussed.  ° ° ° ° , DPM  ° ° °

## 2021-12-24 NOTE — Patient Instructions (Signed)
Peroneal Tendinopathy Rehab Ask your health care provider which exercises are safe for you. Do exercises exactly as told by your health care provider and adjust them as directed. It is normal to feel mild stretching, pulling, tightness, or discomfort as you do these exercises. Stop right away if you feel sudden pain or your pain gets worse. Do not begin these exercises until told by your health care provider. Stretching and range-of-motion exercises These exercises warm up your muscles and joints and improve the movement and flexibility of your ankle. These exercises also help to relieve pain and stiffness. Gastroc and soleus stretch, standing This is an exercise in which you stand on a step and use your body weight to stretch your calf muscles. To do this exercise: Stand on the edge of a step on the ball of your left / right foot. The ball of your foot is on the walking surface, right under your toes. Keep your other foot firmly on the same step. Hold on to the wall, a railing, or a chair for balance. Slowly lift your other foot, allowing your body weight to press your left / right heel down over the edge of the step. You should feel a stretch in your left / right calf (gastrocnemius and soleus). Hold this position for __________ seconds. Return both feet to the step. Repeat this exercise with a slight bend in your left / right knee. Repeat __________ times with your left / right knee straight and __________ times with your left / right knee bent. Complete this exercise __________ times a day. Strengthening exercises These exercises build strength and endurance in your foot and ankle. Endurance is the ability to use your muscles for a long time, even after they get tired. Ankle dorsiflexion with band  Secure a rubber exercise band or tube to an object, such as a table leg, that will not move when the band is pulled. Secure the other end of the band around your left / right foot. Sit on the  floor, facing the object with your left / right leg extended. The band or tube should be slightly tense when your foot is relaxed. Slowly flex your left / right ankle and toes to bring your foot toward you (dorsiflexion). Hold this position for __________ seconds. Let the band or tube slowly pull your foot back to the starting position. Repeat __________ times. Complete this exercise __________ times a day. Ankle eversion Sit on the floor with your legs straight out in front of you. Loop a rubber exercise band or tube around the ball of your left / right foot. The ball of your foot is on the walking surface, right under your toes. Hold the ends of the band in your hands, or secure the band to a stable object. The band or tube should be slightly tense when your foot is relaxed. Slowly push your foot outward, away from your other leg (eversion). Hold this position for __________ seconds. Slowly return your foot to the starting position. Repeat __________ times. Complete this exercise __________ times a day. Plantar flexion, standing This exercise is sometimes called standing heel raise. Stand with your feet shoulder-width apart. Place your hands on a wall or table to steady yourself as needed, but try not to use it for support. Keep your weight spread evenly over the width of your feet while you slowly rise up on your toes (plantar flexion). If told by your health care provider: Shift your weight toward your left / right  leg until you feel challenged. Stand on your left / right leg only. Hold this position for __________ seconds. Repeat __________ times. Complete this exercise __________ times a day. Single leg stand Without shoes, stand near a railing or in a doorway. You may hold on to the railing or door frame as needed. Stand on your left / right foot. Keep your big toe down on the floor and try to keep your arch lifted. Do not roll to the outside of your foot. If this exercise is too  easy, you can try it with your eyes closed or while standing on a pillow. Hold this position for __________ seconds. Repeat __________ times. Complete this exercise __________ times a day. This information is not intended to replace advice given to you by your health care provider. Make sure you discuss any questions you have with your health care provider. Document Revised: 02/06/2019 Document Reviewed: 02/06/2019 Elsevier Patient Education  2022 [employer]   Plantar Fasciitis (Heel Spur Syndrome) with Rehab The plantar fascia is a fibrous, ligament-like, soft-tissue structure that spans the bottom of the foot. Plantar fasciitis is a condition that causes pain in the foot due to inflammation of the tissue. SYMPTOMS  Pain and tenderness on the underneath side of the foot. Pain that worsens with standing or walking. CAUSES  Plantar fasciitis is caused by irritation and injury to the plantar fascia on the underneath side of the foot. Common mechanisms of injury include: Direct trauma to bottom of the foot. Damage to a small nerve that runs under the foot where the main fascia attaches to the heel bone. Stress placed on the plantar fascia due to bone spurs. RISK INCREASES WITH:  Activities that place stress on the plantar fascia (running, jumping, pivoting, or cutting). Poor strength and flexibility. Improperly fitted shoes. Tight calf muscles. Flat feet. Failure to warm-up properly before activity. Obesity. PREVENTION Warm up and stretch properly before activity. Allow for adequate recovery between workouts. Maintain physical fitness: Strength, flexibility, and endurance. Cardiovascular fitness. Maintain a health body weight. Avoid stress on the plantar fascia. Wear properly fitted shoes, including arch supports for individuals who have flat feet.  PROGNOSIS  If treated properly, then the symptoms of plantar fasciitis usually resolve without surgery. However, occasionally  surgery is necessary.  RELATED COMPLICATIONS  Recurrent symptoms that may result in a chronic condition. Problems of the lower back that are caused by compensating for the injury, such as limping. Pain or weakness of the foot during push-off following surgery. Chronic inflammation, scarring, and partial or complete fascia tear, occurring more often from repeated injections.  TREATMENT  Treatment initially involves the use of ice and medication to help reduce pain and inflammation. The use of strengthening and stretching exercises may help reduce pain with activity, especially stretches of the Achilles tendon. These exercises may be performed at home or with a therapist. Your caregiver may recommend that you use heel cups of arch supports to help reduce stress on the plantar fascia. Occasionally, corticosteroid injections are given to reduce inflammation. If symptoms persist for greater than 6 months despite non-surgical (conservative), then surgery may be recommended.   MEDICATION  If pain medication is necessary, then nonsteroidal anti-inflammatory medications, such as aspirin and ibuprofen, or other minor pain relievers, such as acetaminophen, are often recommended. Do not take pain medication within 7 days before surgery. Prescription pain relievers may be given if deemed necessary by your caregiver. Use only as directed and only as much as you need.  Corticosteroid injections may be given by your caregiver. These injections should be reserved for the most serious cases, because they may only be given a certain number of times.  HEAT AND COLD Cold treatment (icing) relieves pain and reduces inflammation. Cold treatment should be applied for 10 to 15 minutes every 2 to 3 hours for inflammation and pain and immediately after any activity that aggravates your symptoms. Use ice packs or massage the area with a piece of ice (ice massage). Heat treatment may be used prior to performing the stretching  and strengthening activities prescribed by your caregiver, physical therapist, or athletic trainer. Use a heat pack or soak the injury in warm water.  SEEK IMMEDIATE MEDICAL CARE IF: Treatment seems to offer no benefit, or the condition worsens. Any medications produce adverse side effects.  EXERCISES- RANGE OF MOTION (ROM) AND STRETCHING EXERCISES - Plantar Fasciitis (Heel Spur Syndrome) These exercises may help you when beginning to rehabilitate your injury. Your symptoms may resolve with or without further involvement from your physician, physical therapist or athletic trainer. While completing these exercises, remember:  Restoring tissue flexibility helps normal motion to return to the joints. This allows healthier, less painful movement and activity. An effective stretch should be held for at least 30 seconds. A stretch should never be painful. You should only feel a gentle lengthening or release in the stretched tissue.  RANGE OF MOTION - Toe Extension, Flexion Sit with your right / left leg crossed over your opposite knee. Grasp your toes and gently pull them back toward the top of your foot. You should feel a stretch on the bottom of your toes and/or foot. Hold this stretch for 10 seconds. Now, gently pull your toes toward the bottom of your foot. You should feel a stretch on the top of your toes and or foot. Hold this stretch for 10 seconds. Repeat  times. Complete this stretch 3 times per day.   RANGE OF MOTION - Ankle Dorsiflexion, Active Assisted Remove shoes and sit on a chair that is preferably not on a carpeted surface. Place right / left foot under knee. Extend your opposite leg for support. Keeping your heel down, slide your right / left foot back toward the chair until you feel a stretch at your ankle or calf. If you do not feel a stretch, slide your bottom forward to the edge of the chair, while still keeping your heel down. Hold this stretch for 10 seconds. Repeat 3  times. Complete this stretch 2 times per day.   STRETCH  Gastroc, Standing Place hands on wall. Extend right / left leg, keeping the front knee somewhat bent. Slightly point your toes inward on your back foot. Keeping your right / left heel on the floor and your knee straight, shift your weight toward the wall, not allowing your back to arch. You should feel a gentle stretch in the right / left calf. Hold this position for 10 seconds. Repeat 3 times. Complete this stretch 2 times per day.  STRETCH  Soleus, Standing Place hands on wall. Extend right / left leg, keeping the other knee somewhat bent. Slightly point your toes inward on your back foot. Keep your right / left heel on the floor, bend your back knee, and slightly shift your weight over the back leg so that you feel a gentle stretch deep in your back calf. Hold this position for 10 seconds. Repeat 3 times. Complete this stretch 2 times per day.  STRETCH  Gastrocsoleus, Standing  Note: This exercise can place a lot of stress on your foot and ankle. Please complete this exercise only if specifically instructed by your caregiver.  Place the ball of your right / left foot on a step, keeping your other foot firmly on the same step. Hold on to the wall or a rail for balance. Slowly lift your other foot, allowing your body weight to press your heel down over the edge of the step. You should feel a stretch in your right / left calf. Hold this position for 10 seconds. Repeat this exercise with a slight bend in your right / left knee. Repeat 3 times. Complete this stretch 2 times per day.   STRENGTHENING EXERCISES - Plantar Fasciitis (Heel Spur Syndrome)  These exercises may help you when beginning to rehabilitate your injury. They may resolve your symptoms with or without further involvement from your physician, physical therapist or athletic trainer. While completing these exercises, remember:  Muscles can gain both the endurance and  the strength needed for everyday activities through controlled exercises. Complete these exercises as instructed by your physician, physical therapist or athletic trainer. Progress the resistance and repetitions only as guided.  STRENGTH - Towel Curls Sit in a chair positioned on a non-carpeted surface. Place your foot on a towel, keeping your heel on the floor. Pull the towel toward your heel by only curling your toes. Keep your heel on the floor. Repeat 3 times. Complete this exercise 2 times per day.  STRENGTH - Ankle Inversion Secure one end of a rubber exercise band/tubing to a fixed object (table, pole). Loop the other end around your foot just before your toes. Place your fists between your knees. This will focus your strengthening at your ankle. Slowly, pull your big toe up and in, making sure the band/tubing is positioned to resist the entire motion. Hold this position for 10 seconds. Have your muscles resist the band/tubing as it slowly pulls your foot back to the starting position. Repeat 3 times. Complete this exercises 2 times per day.  Document Released: 10/18/2005 Document Revised: 01/10/2012 Document Reviewed: 01/30/2009 [facility] Patient Information 2014 [place], [place]

## 2022-02-04 ENCOUNTER — Ambulatory Visit: Payer: BC Managed Care – PPO | Admitting: Podiatry

## 2022-03-08 DIAGNOSIS — F1120 Opioid dependence, uncomplicated: Secondary | ICD-10-CM | POA: Diagnosis not present

## 2022-03-25 ENCOUNTER — Encounter: Payer: Self-pay | Admitting: Podiatry

## 2022-03-25 ENCOUNTER — Ambulatory Visit: Payer: BC Managed Care – PPO | Admitting: Podiatry

## 2022-03-25 DIAGNOSIS — M7672 Peroneal tendinitis, left leg: Secondary | ICD-10-CM | POA: Diagnosis not present

## 2022-03-25 DIAGNOSIS — M722 Plantar fascial fibromatosis: Secondary | ICD-10-CM

## 2022-03-25 MED ORDER — DEXAMETHASONE SODIUM PHOSPHATE 120 MG/30ML IJ SOLN
4.0000 mg | Freq: Once | INTRAMUSCULAR | Status: AC
  Administered 2022-03-25: 4 mg via INTRA_ARTICULAR

## 2022-03-25 NOTE — Progress Notes (Signed)
  Subjective:  Patient ID: Timothy Mcintosh, male    DOB: Jul 20, 1966,   MRN: [id number]  Chief Complaint  Patient presents with   Foot Pain    Follow up with foot pain issues. Some improvement in the right foot, no improvement in the left foot.    56 y.o. male presents for follow-up of left peroneal tendonitis and right plantar fasciitis. Relates right is doing better with brace. The left side did better for about a week after injection but the pain returned. Relates he has been stretching as well. Requesting to have another injection today . Denies any other pedal complaints. Denies n/v/f/c.   Past Medical History:  Diagnosis Date   Family history of breast cancer    Family history of genetic disease carrier    sister PALB2+   Family history of lung cancer    Family history of throat cancer    Headache    Hyperlipidemia    Hypertension    Rheumatoid arthritis ([place])    Vision abnormalities     Objective:  Physical Exam: Vascular: DP/PT pulses 2/4 bilateral. CFT <3 seconds. Normal hair growth on digits. No edema.  Skin. No lacerations or abrasions bilateral feet.  Musculoskeletal: MMT 5/5 bilateral lower extremities in DF, PF, Inversion and Eversion. Deceased ROM in DF of ankle joint. Pain on left side to insertion of peroneal tendon. Mild tenderness proximally along the tendon. Pain to medial calcaneal tubercle on the right foot improved.  Neurological: Sensation intact to light touch.   Assessment:   1. Peroneal tendonitis, left   2. Plantar fasciitis, right       Plan:  Patient was evaluated and treated and all questions answered. X-rays reviewed and discussed with patient. No acute fractures or dislocations noted. Bilateral plantar and posterior spurring of the calcaneus noted.  Discussed peroneal tendinitis and plantar fasciitis  and treatment options at length with patient Continue stretching and meloxicam as needed.  Dispensed Tri-Lock ankle brace as worn out the  original.  Continue pf brace on right as needed.  Requesting injection of tendon today. Procedure below. Discussed that if the symptoms do not improve can consider PT/MRI. Patient to return in 6 to 8 weeks or sooner if symptoms fail to improve or worsen.  Procedure: Injection Tendon/Ligament Discussed alternatives, risks, complications and verbal consent was obtained.  Location: Left peroneal tendon . Skin Prep: Alcohol. Injectate: 1cc 0.5% marcaine plain, 1 cc dexamethasone.  Disposition: Patient tolerated procedure well. Injection site dressed with a band-aid.  Post-injection care was discussed and return precautions discussed.     Lorenda Peck, DPM

## 2022-05-03 DIAGNOSIS — F1120 Opioid dependence, uncomplicated: Secondary | ICD-10-CM | POA: Diagnosis not present

## 2022-05-13 ENCOUNTER — Ambulatory Visit: Payer: BC Managed Care – PPO | Admitting: Podiatry

## 2022-10-07 ENCOUNTER — Encounter: Payer: Self-pay | Admitting: Family Medicine

## 2022-10-07 DIAGNOSIS — Z711 Person with feared health complaint in whom no diagnosis is made: Secondary | ICD-10-CM

## 2022-10-08 ENCOUNTER — Encounter: Payer: Self-pay | Admitting: Family Medicine

## 2022-10-08 DIAGNOSIS — F5101 Primary insomnia: Secondary | ICD-10-CM

## 2022-10-08 MED ORDER — TRAZODONE HCL 100 MG PO TABS: 100.0000 mg | ORAL_TABLET | Freq: Every day | ORAL | 0 refills | Status: DC

## 2022-11-10 NOTE — Progress Notes (Addendum)
[facility] [address], [address] [place], [place] [address] [phone]  Date:  11/11/2022   Name:  Timothy Mcintosh   DOB:  Mar 26, 1966   MRN:  [id number]  PCP:  Pearline Cables, MD    Chief Complaint: Follow-up (Concerns/ questions: none/Shingrix: none yet/Tdap:yes/Flu shot today: yes)   History of Present Illness:  Timothy Mcintosh is a 57 y.o. 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 Dierdre Forth- he is not currently under any treatment as nothing seemed to work  He has 3 daughters , 1 son and 7 grandchildren- range from 91 to 29 years old   He qualifies for lung cancer screening which can be updated-can also offer CT coronary calcium  Recommend tetanus-declines today, reminded to get this should he have any sort of wound Shingrix-recommended, he is still thinking about this Flu shot- update today  Can update blood work today  Lisinopril Omeprazole Revatio as needed Simvastatin 40 Trazodone at bedtime He does receive Suboxone from an outside provider  Does not get a lot of formal exercise but is physically active in his day-to-day life.  He notes no chest pain or shortness of breath Patient Active Problem List   Diagnosis Date Noted   COVID-19 11/20/2021   Status post total hip replacement, left 12/14/2019   OA (osteoarthritis) of knee 09/04/2019   Primary osteoarthritis of left hip 09/04/2019   Left lower quadrant abdominal pain 09/04/2019   Monoallelic mutation of PALB2 gene 05/31/2018   Genetic testing 05/31/2018   Hypertension 05/16/2018   Left elbow pain 05/16/2018   Family history of  genetic disease carrier    Family history of breast cancer    Family history of lung cancer    Family history of throat cancer    Post concussion syndrome 09/15/2017   Restless leg syndrome 09/15/2017   Rheumatoid arthritis (HCC) 04/01/2016   Dyslipidemia 12/25/2015   Tobacco use disorder 12/25/2015   Insomnia 12/25/2015   Primary osteoarthritis of left knee 07/30/2015   GERD (gastroesophageal reflux disease) 03/04/2014   Major depression 03/04/2014   Reactive airway disease 09/19/2013   ED (erectile dysfunction) 04/19/2013   Dorsalgia 11/21/2011   Hyperlipidemia 11/21/2011   Gout 11/21/2011   GAD (generalized anxiety disorder) 11/21/2011    Past Medical History:  Diagnosis Date   Family history of breast cancer    Family history of genetic disease carrier    sister PALB2+   Family history of lung cancer    Family history of throat cancer    Headache    Hyperlipidemia    Hypertension    Rheumatoid arthritis (HCC)    Vision abnormalities     Past Surgical History:  Procedure Laterality Date   BACK SURGERY Bilateral    c5 -c6   CARPAL TUNNEL RELEASE Right    KNEE SURGERY Bilateral    TOTAL HIP ARTHROPLASTY Left 12/14/2019   Procedure: TOTAL HIP ARTHROPLASTY ANTERIOR APPROACH;  Surgeon: Jodi Geralds, MD;  Location: [facility];  Service: Orthopedics;  Laterality: Left;    Social History   Tobacco Use   Smoking status: Every Day   Smokeless tobacco: Never  Tobacco comments:    smoking since age 11  Vaping Use   Vaping Use: Never used  Substance Use Topics   Alcohol use: No    Alcohol/week: 0.0 standard drinks of alcohol   Drug use: No    Family History  Problem Relation Age of Onset   High Cholesterol Father    Kidney Stones Father    Heart failure Maternal Grandfather        Diet age 103 of "massive heart failure" unknown if MI or not   Lung cancer Mother    Breast cancer Mother 63   Kidney cancer Mother    Skin cancer Mother    Breast cancer Sister 4        PALB2+   Breast cancer Paternal Grandfather        85's   Throat cancer Paternal Grandfather    Other Sister        PALB2+   Throat cancer Other    Throat cancer Other     No Known Allergies  Medication list has been reviewed and updated.  Current Outpatient Medications on File Prior to Visit  Medication Sig Dispense Refill   Multiple Vitamin (MULTIVITAMIN) capsule Take 1 capsule by mouth daily.      sildenafil (REVATIO) 20 MG tablet TAKE 1 TO 5 TABLETS BY MOUTH DAILY AS NEEDED FOR ERECTILE DYSFUNCTION 50 tablet 2   SUBOXONE 8-2 MG FILM Place 1 Film under the tongue 2 (two) times daily.   0   No current facility-administered medications on file prior to visit.    Review of Systems:  As per HPI- otherwise negative.   Physical Examination: Vitals:   11/11/22 1549  BP: 138/82  Pulse: 75  Resp: 18  Temp: 98.3 F (36.8 C)  SpO2: 98%   Vitals:   11/11/22 1549  Weight: 170 lb 6.4 oz (77.3 kg)  Height: 5\' 7"  (1.702 m)   Body mass index is 26.69 kg/m. Ideal Body Weight: Weight in (lb) to have BMI = 25: 159.3  GEN: no acute distress.  Looks well, normal weight HEENT: Atraumatic, Normocephalic.  Ears and Nose: No external deformity. CV: RRR, No M/G/R. No JVD. No thrill. No extra heart sounds. PULM: CTA B, no wheezes, crackles, rhonchi. No retractions. No resp. distress. No accessory muscle use. ABD: S, NT, ND, +BS. No rebound. No HSM. EXTR: No c/c/e PSYCH: Normally interactive. Conversant.    Assessment and Plan: Physical exam  Tobacco use disorder - Plan: CT CHEST LUNG CA SCREEN LOW DOSE W/O CM  Gastroesophageal reflux disease, unspecified whether esophagitis present - Plan: Comprehensive metabolic panel  Screening for prostate cancer - Plan: PSA  Dyslipidemia - Plan: Lipid panel, CT CARDIAC SCORING (SELF PAY ONLY), simvastatin (ZOCOR) 40 MG tablet  Screening for diabetes mellitus - Plan: Comprehensive metabolic panel, Hemoglobin A1c  Screening for  deficiency anemia - Plan: CBC  Gout, unspecified cause, unspecified chronicity, unspecified site - Plan: allopurinol (ZYLOPRIM) 300 MG tablet  Accelerated hypertension - Plan: lisinopril (ZESTRIL) 20 MG tablet  Gastroesophageal reflux disease - Plan: omeprazole (PRILOSEC) 40 MG capsule  Primary insomnia - Plan: traZODone (DESYREL) 100 MG tablet  Screening for malignant neoplasm of colon - Plan: Ambulatory referral to Gastroenterology  Need for influenza vaccination - Plan: Flu Vaccine QUAD 6+ mos PF IM (Fluarix Quad PF)  Patient seen today for physical exam.  Encouraged healthy diet and exercise routine Refilled medications, good blood pressure control His colonoscopy is due, most recently done in [place].  He would like to transfer care to a local GI group Refill trazodone for sleep Order CT coronary calcium, lung cancer screening CT scan Will plan further follow- up pending labs.   Signed Lamar Blinks, MD  Addendum 1/12, received labs as below.  Message to patient A1c was in the prediabetes range in 2021, 2022 Results for orders placed or performed in visit on 11/11/22  CBC  Result Value Ref Range   WBC 11.8 (H) 4.0 - 10.5 K/uL   RBC 4.42 4.22 - 5.81 Mil/uL   Platelets 296.0 150.0 - 400.0 K/uL   Hemoglobin 13.2 13.0 - 17.0 g/dL   HCT 39.3 39.0 - 52.0 %   MCV 89.0 78.0 - 100.0 fl   MCHC 33.5 30.0 - 36.0 g/dL   RDW 14.3 11.5 - 15.5 %  Comprehensive metabolic panel  Result Value Ref Range   Sodium 139 135 - 145 mEq/L   Potassium 4.8 3.5 - 5.1 mEq/L   Chloride 102 96 - 112 mEq/L   CO2 28 19 - 32 mEq/L   Glucose, Bld 92 70 - 99 mg/dL   BUN 14 6 - 23 mg/dL   Creatinine, Ser 0.87 0.40 - 1.50 mg/dL   Total Bilirubin 0.4 0.2 - 1.2 mg/dL   Alkaline Phosphatase 110 39 - 117 U/L   AST 15 0 - 37 U/L   ALT 14 0 - 53 U/L   Total Protein 6.8 6.0 - 8.3 g/dL   Albumin 4.8 3.5 - 5.2 g/dL   GFR 95.64 >60.00 mL/min   Calcium 9.9 8.4 - 10.5 mg/dL  Hemoglobin A1c  Result  Value Ref Range   Hgb A1c MFr Bld 5.7 4.6 - 6.5 %  Lipid panel  Result Value Ref Range   Cholesterol 121 0 - 200 mg/dL   Triglycerides 67.0 0.0 - 149.0 mg/dL   HDL 47.20 >39.00 mg/dL   VLDL 13.4 0.0 - 40.0 mg/dL   LDL Cholesterol 61 0 - 99 mg/dL   Total CHOL/HDL Ratio 3    NonHDL 74.01   PSA  Result Value Ref Range   PSA 0.39 0.10 - 4.00 ng/mL

## 2022-11-11 ENCOUNTER — Ambulatory Visit: Payer: Managed Care, Other (non HMO) | Admitting: Family Medicine

## 2022-11-11 VITALS — BP 138/82 | HR 75 | Temp 98.3°F | Resp 18 | Ht 67.0 in | Wt 170.4 lb

## 2022-11-11 DIAGNOSIS — Z131 Encounter for screening for diabetes mellitus: Secondary | ICD-10-CM | POA: Diagnosis not present

## 2022-11-11 DIAGNOSIS — R7303 Prediabetes: Secondary | ICD-10-CM

## 2022-11-11 DIAGNOSIS — E785 Hyperlipidemia, unspecified: Secondary | ICD-10-CM | POA: Diagnosis not present

## 2022-11-11 DIAGNOSIS — Z130 Encounter for screening for diseases of the blood and blood-forming organs and certain disorders involving the immune mechanism: Secondary | ICD-10-CM

## 2022-11-11 DIAGNOSIS — K219 Gastro-esophageal reflux disease without esophagitis: Secondary | ICD-10-CM | POA: Diagnosis not present

## 2022-11-11 DIAGNOSIS — M109 Gout, unspecified: Secondary | ICD-10-CM

## 2022-11-11 DIAGNOSIS — F17200 Nicotine dependence, unspecified, uncomplicated: Secondary | ICD-10-CM

## 2022-11-11 DIAGNOSIS — Z23 Encounter for immunization: Secondary | ICD-10-CM | POA: Diagnosis not present

## 2022-11-11 DIAGNOSIS — Z0000 Encounter for general adult medical examination without abnormal findings: Secondary | ICD-10-CM

## 2022-11-11 DIAGNOSIS — Z125 Encounter for screening for malignant neoplasm of prostate: Secondary | ICD-10-CM

## 2022-11-11 DIAGNOSIS — I10 Essential (primary) hypertension: Secondary | ICD-10-CM

## 2022-11-11 DIAGNOSIS — D72829 Elevated white blood cell count, unspecified: Secondary | ICD-10-CM

## 2022-11-11 DIAGNOSIS — Z1211 Encounter for screening for malignant neoplasm of colon: Secondary | ICD-10-CM

## 2022-11-11 DIAGNOSIS — F5101 Primary insomnia: Secondary | ICD-10-CM

## 2022-11-11 MED ORDER — SIMVASTATIN 40 MG PO TABS: 40.0000 mg | ORAL_TABLET | Freq: Every day | ORAL | 3 refills | Status: DC

## 2022-11-11 MED ORDER — OMEPRAZOLE 40 MG PO CPDR: 40.0000 mg | DELAYED_RELEASE_CAPSULE | Freq: Every day | ORAL | 3 refills | Status: DC

## 2022-11-11 MED ORDER — LISINOPRIL 20 MG PO TABS: 20.0000 mg | ORAL_TABLET | Freq: Every day | ORAL | 3 refills | Status: DC

## 2022-11-11 MED ORDER — TRAZODONE HCL 100 MG PO TABS: 100.0000 mg | ORAL_TABLET | Freq: Every day | ORAL | 3 refills | Status: DC

## 2022-11-11 MED ORDER — ALLOPURINOL 300 MG PO TABS: 300.0000 mg | ORAL_TABLET | Freq: Every day | ORAL | 3 refills | Status: DC

## 2022-11-11 NOTE — Patient Instructions (Signed)
Good to see you again today I will be in touch with your labs Recommend that you consider the shingles vaccine and tetanus- if you have a wound get a tetanus booster!  Stop by lab and then imaging on the ground floor to schedule your lung cancer and coronary CT scans

## 2022-11-12 ENCOUNTER — Encounter: Payer: Self-pay | Admitting: Family Medicine

## 2022-11-12 DIAGNOSIS — R7303 Prediabetes: Secondary | ICD-10-CM

## 2022-11-12 HISTORY — DX: Prediabetes: R73.03

## 2022-11-12 LAB — CBC
HCT: 39.3 % (ref 39.0–52.0)
Hemoglobin: 13.2 g/dL (ref 13.0–17.0)
MCHC: 33.5 g/dL (ref 30.0–36.0)
MCV: 89 fl (ref 78.0–100.0)
Platelets: 296 10*3/uL (ref 150.0–400.0)
RBC: 4.42 Mil/uL (ref 4.22–5.81)
RDW: 14.3 % (ref 11.5–15.5)
WBC: 11.8 10*3/uL — ABNORMAL HIGH (ref 4.0–10.5)

## 2022-11-12 LAB — COMPREHENSIVE METABOLIC PANEL
ALT: 14 U/L (ref 0–53)
AST: 15 U/L (ref 0–37)
Albumin: 4.8 g/dL (ref 3.5–5.2)
Alkaline Phosphatase: 110 U/L (ref 39–117)
BUN: 14 mg/dL (ref 6–23)
CO2: 28 mEq/L (ref 19–32)
Calcium: 9.9 mg/dL (ref 8.4–10.5)
Chloride: 102 mEq/L (ref 96–112)
Creatinine, Ser: 0.87 mg/dL (ref 0.40–1.50)
GFR: 95.64 mL/min (ref >60.00)
Glucose, Bld: 92 mg/dL (ref 70–99)
Potassium: 4.8 mEq/L (ref 3.5–5.1)
Sodium: 139 mEq/L (ref 135–145)
Total Bilirubin: 0.4 mg/dL (ref 0.2–1.2)
Total Protein: 6.8 g/dL (ref 6.0–8.3)

## 2022-11-12 LAB — LIPID PANEL
Cholesterol: 121 mg/dL (ref 0–200)
HDL: 47.2 mg/dL (ref >39.00)
LDL Cholesterol: 61 mg/dL (ref 0–99)
NonHDL: 74.01
Total CHOL/HDL Ratio: 3
Triglycerides: 67 mg/dL (ref 0.0–149.0)
VLDL: 13.4 mg/dL (ref 0.0–40.0)

## 2022-11-12 LAB — PSA: PSA: 0.39 ng/mL (ref 0.10–4.00)

## 2022-11-12 LAB — HEMOGLOBIN A1C: Hgb A1c MFr Bld: 5.7 % (ref 4.6–6.5)

## 2022-11-12 NOTE — Addendum Note (Signed)
Addended by: Lamar Blinks C on: 11/12/2022 01:29 PM   Modules accepted: Orders

## 2022-11-19 ENCOUNTER — Ambulatory Visit (HOSPITAL_BASED_OUTPATIENT_CLINIC_OR_DEPARTMENT_OTHER)
Admission: RE | Admit: 2022-11-19 | Discharge: 2022-11-19 | Disposition: A | Discharge status: Home / Self Care | Payer: Managed Care, Other (non HMO) | Source: Ambulatory Visit | Attending: Family Medicine | Admitting: Family Medicine

## 2022-11-19 ENCOUNTER — Telehealth: Payer: Self-pay

## 2022-11-19 ENCOUNTER — Encounter: Payer: Self-pay | Admitting: Family Medicine

## 2022-11-19 ENCOUNTER — Encounter (HOSPITAL_BASED_OUTPATIENT_CLINIC_OR_DEPARTMENT_OTHER): Payer: Self-pay

## 2022-11-19 DIAGNOSIS — E785 Hyperlipidemia, unspecified: Secondary | ICD-10-CM

## 2022-11-19 DIAGNOSIS — F17200 Nicotine dependence, unspecified, uncomplicated: Secondary | ICD-10-CM

## 2022-11-19 MED ORDER — DOXYCYCLINE HYCLATE 100 MG PO CAPS: 100.0000 mg | ORAL_CAPSULE | Freq: Two times a day (BID) | ORAL | 0 refills | Status: DC

## 2022-11-19 NOTE — Telephone Encounter (Signed)
done

## 2022-11-19 NOTE — Progress Notes (Signed)
Pt arrived to for a LDCT and a CT calcium score. Upon interviewing pt and obtaining hx, pt began smoking at the age of 1, he smokes just under a half a pack of cigarettes daily. Pt is currently 57 years of age, meaning he has smoked for 37 years of his life. We are required to determine the patients pack year smoking hx prior to imaging. ( # of packs of cigarettes per day x # of years smoked= # of pack years); meaning  0.5x37=18.5.  The guidelines for LDCT are- Asymptomatic patient between the ages 78-77 with a minimum of a 20 pack year smoking hx, who has not quit smoking longer than 15 years ago.  Because this patient did not met the guidleline for LDCT screening, we attempted to contact Dr. Lorelei Pont who was out of the office, spoke with [facility], who spoke with office admin Estill Bamberg. Who advised to not scan the LDCT, and await further contact from Dr. Lorelei Pont, all was explained to the patient, and we apologized for any inconvenience on his behalf.

## 2022-11-20 ENCOUNTER — Encounter: Payer: Self-pay | Admitting: Family Medicine

## 2022-11-20 DIAGNOSIS — R931 Abnormal findings on diagnostic imaging of heart and coronary circulation: Secondary | ICD-10-CM

## 2022-11-21 MED ORDER — ROSUVASTATIN CALCIUM 20 MG PO TABS: 20.0000 mg | ORAL_TABLET | Freq: Every day | ORAL | 3 refills | Status: DC

## 2022-11-23 ENCOUNTER — Encounter: Payer: Self-pay | Admitting: Family Medicine

## 2022-11-23 ENCOUNTER — Telehealth: Payer: Self-pay | Admitting: Family Medicine

## 2022-11-23 NOTE — Telephone Encounter (Signed)
Sonia Side, Science writer, calling to advise that per the radiologist if a patient does not meet the 20 pk a year criteria they are supposed to be turned away. Sonia Side said when the patient gives his history he is giving that history so he is not meeting the criteria for the lung screening so he has to be "turned away." Sonia Side is checking with the radiologist as well. He is trying to make sure everyone is on the same page and clear up any possible miscommunication.He is requesting a call back at [phone]. That is his direct line. Okay to leave voicemail

## 2022-11-24 NOTE — Telephone Encounter (Signed)
Dr. Lorelei Pont spoke to radiology and got this issue straightened out.

## 2022-12-09 ENCOUNTER — Encounter: Payer: Self-pay | Admitting: Family Medicine

## 2022-12-10 ENCOUNTER — Ambulatory Visit (HOSPITAL_BASED_OUTPATIENT_CLINIC_OR_DEPARTMENT_OTHER)
Admission: RE | Admit: 2022-12-10 | Discharge: 2022-12-10 | Disposition: A | Discharge status: Home / Self Care | Payer: Managed Care, Other (non HMO) | Source: Ambulatory Visit | Attending: Family Medicine | Admitting: Family Medicine

## 2022-12-10 DIAGNOSIS — F17200 Nicotine dependence, unspecified, uncomplicated: Secondary | ICD-10-CM | POA: Diagnosis not present

## 2022-12-13 ENCOUNTER — Encounter: Payer: Self-pay | Admitting: Family Medicine

## 2022-12-13 ENCOUNTER — Telehealth: Payer: Self-pay | Admitting: Family Medicine

## 2022-12-13 NOTE — Telephone Encounter (Signed)
Timothy Mcintosh with [facility] called at the request of her mgr,Timothy Mcintosh, to make sure that whoever does our lung cancer screenings understand the criteria as there are patients being turned away because they do not meet the criteria.   Patient has to have smoked a pack of cigarettes per day for 20 years or 1/2 a pack per day for 40 years. The patient has to have quit smoking within the last 15 years or less. If pt does not have a "yes" to all of these then they do not qualify and cannot get a Lung cancer screening. The provider must order a CT scan of chest without contrast. If there are any questions, please call [phone] to speak with any CT tech.

## 2022-12-22 DIAGNOSIS — H539 Unspecified visual disturbance: Secondary | ICD-10-CM | POA: Insufficient documentation

## 2022-12-22 DIAGNOSIS — R519 Headache, unspecified: Secondary | ICD-10-CM | POA: Insufficient documentation

## 2022-12-28 ENCOUNTER — Ambulatory Visit: Payer: Managed Care, Other (non HMO) | Attending: Cardiology | Admitting: Cardiology

## 2022-12-28 ENCOUNTER — Encounter: Payer: Self-pay | Admitting: Cardiology

## 2022-12-28 VITALS — BP 124/66 | HR 76 | Ht 67.0 in | Wt 168.1 lb

## 2022-12-28 DIAGNOSIS — R0989 Other specified symptoms and signs involving the circulatory and respiratory systems: Secondary | ICD-10-CM | POA: Insufficient documentation

## 2022-12-28 DIAGNOSIS — F17210 Nicotine dependence, cigarettes, uncomplicated: Secondary | ICD-10-CM | POA: Diagnosis not present

## 2022-12-28 DIAGNOSIS — I10 Essential (primary) hypertension: Secondary | ICD-10-CM

## 2022-12-28 DIAGNOSIS — I700 Atherosclerosis of aorta: Secondary | ICD-10-CM | POA: Diagnosis not present

## 2022-12-28 DIAGNOSIS — I2510 Atherosclerotic heart disease of native coronary artery without angina pectoris: Secondary | ICD-10-CM | POA: Diagnosis not present

## 2022-12-28 DIAGNOSIS — R079 Chest pain, unspecified: Secondary | ICD-10-CM

## 2022-12-28 DIAGNOSIS — F17200 Nicotine dependence, unspecified, uncomplicated: Secondary | ICD-10-CM

## 2022-12-28 DIAGNOSIS — E785 Hyperlipidemia, unspecified: Secondary | ICD-10-CM

## 2022-12-28 MED ORDER — ASPIRIN 81 MG PO TBEC: 81.0000 mg | DELAYED_RELEASE_TABLET | Freq: Every day | ORAL | 3 refills | Status: AC

## 2022-12-28 NOTE — Progress Notes (Signed)
Cardiology Office Note:    Date:  12/28/2022   ID:  Timothy Mcintosh, DOB December 31, 1965, MRN [id number]  PCP:  Darreld Mclean, MD  Cardiologist:  Jenean Lindau, MD   Referring MD: Darreld Mclean, MD    ASSESSMENT:    1. Primary hypertension   2. Dyslipidemia   3. Aortic atherosclerosis ([place])   4. Atherosclerosis of coronary artery of native heart without angina pectoris, unspecified vessel or lesion type   5. Chest pain of uncertain etiology   6. Tobacco use disorder    PLAN:    In order of problems listed above:  Chest pain: Coronary atherosclerosis: Aortic atherosclerosis: Secondary prevention stressed with the patient.  Importance of compliance with diet medication stressed any vocalized understanding.  His symptoms are atypical in nature however in view of risk factors and these findings we will do an exercise stress Cardiolite and he is agreeable. Mixed dyslipidemia: On lipid-lowering medications.  Lipids are at goal.  Diet emphasized. Essential hypertension: Blood pressure stable and diet was emphasized.  Lifestyle modification urged.  Salt intake issues and diet emphasized. Cigarette smoker: I spent 5 minutes with the patient discussing solely about smoking. Smoking cessation was counseled. I suggested to the patient also different medications and pharmacological interventions. Patient is keen to try stopping on its own at this time. He will get back to me if he needs any further assistance in this matter. Abdominal abnormal pulsations: We will get a sonogram to assess for any presence of aneurysm.  He is a smoker. Patient will be seen in follow-up appointment in 6 months or earlier if the patient has any concerns    Medication Adjustments/Labs and Tests Ordered: Current medicines are reviewed at length with the patient today.  Concerns regarding medicines are outlined above.  No orders of the defined types were placed in this encounter.  No orders of the defined  types were placed in this encounter.    History of Present Illness:    Timothy Mcintosh is a 57 y.o. male who is being seen today for the evaluation of coronary artery calcifications and chest pain at the request of Copland, Gay Filler, MD. patient is a pleasant 56 year old male.  He has past medical history of essential hypertension and dyslipidemia and unfortunately heavy smoker since young age.  He denies any chest pain orthopnea or PND.  He mentions to me that he exerts he occasionally has chest tightness and this may not be related to exertion.  At the time of my evaluation, the patient is alert awake oriented and in no distress.  Past Medical History:  Diagnosis Date   COVID-19 11/20/2021   Dorsalgia 11/21/2011   Dyslipidemia 12/25/2015   ED (erectile dysfunction) 04/19/2013   Family history of breast cancer    Family history of genetic disease carrier    sister PALB2+   Family history of lung cancer    Family history of throat cancer    GAD (generalized anxiety disorder) 11/21/2011   Genetic testing 05/31/2018   19/The Common Hereditary Cancers Panel + Renal/Urinary Tract Cancers Panel was ordered.  The following genes were evaluated for sequence changes and exonic deletions/duplications: APC, ATM, AXIN2, BAP1, BARD1, BMPR1A, BRCA1, BRCA2, BRIP1, CDC73, CDH1, CDK4, CDKN1C, CDKN2A (p14ARF), CDKN2A (p16INK4a), CHEK2, CTNNA1, DICER1, DIS3L2, EPCAM*, FH, FLCN, GPC3, GREM1*, HOXB13, KIT, MEN1, MET, MLH1, MSH2,   GERD (gastroesophageal reflux disease) 03/04/2014   Gout 11/21/2011   Headache    Hyperlipidemia    Hyperlipidemia  with target LDL less than 100 11/21/2011   Formatting of this note might be different from the original. 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   Reactive airway disease 09/19/2013   Restless leg syndrome 09/15/2017   Rheumatoid arthritis ([place])    Status post total hip replacement, left 12/14/2019   Tobacco use disorder 12/25/2015   Vision abnormalities     Past Surgical History:  Procedure Laterality Date   BACK SURGERY Bilateral    c5 -c6   CARPAL TUNNEL RELEASE Right    KNEE SURGERY Bilateral    TOTAL HIP ARTHROPLASTY Left 12/14/2019   Procedure: TOTAL HIP ARTHROPLASTY ANTERIOR APPROACH;  Surgeon: Dorna Leitz, MD;  Location: [facility];  Service: Orthopedics;  Laterality: Left;    Current Medications: Current Meds  Medication Sig   allopurinol (ZYLOPRIM) 300 MG tablet Take 1 tablet (300 mg total) by mouth daily.   lisinopril (ZESTRIL) 20 MG tablet Take 1 tablet (20 mg total) by mouth daily.   Multiple Vitamin (MULTIVITAMIN) capsule Take 1 capsule by mouth daily.    omeprazole (PRILOSEC) 40 MG capsule Take 1 capsule (40 mg total) by mouth daily.   rosuvastatin (CRESTOR) 20 MG tablet Take 1 tablet (20 mg total) by mouth daily.   sildenafil (REVATIO) 20 MG tablet TAKE 1 TO 5 TABLETS BY MOUTH DAILY AS NEEDED FOR ERECTILE DYSFUNCTION   SUBOXONE 8-2 MG FILM Place 1 Film under the tongue 2 (two) times daily.    traZODone (DESYREL) 100 MG tablet Take 1 tablet (100 mg total) by mouth at bedtime.     Allergies:   Patient has no known allergies.   Social History   Socioeconomic History   Marital status: Married    Spouse name: Not on file   Number of children: Not on file   Years of education: Not on file   Highest education level: Not on file  Occupational History   Not on file  Tobacco Use   Smoking status: Every Day   Smokeless tobacco: Never   Tobacco comments:    smoking since age 76  Vaping  Use   Vaping Use: Never used  Substance and Sexual Activity   Alcohol use: No    Alcohol/week: 0.0 standard drinks of alcohol   Drug use: No   Sexual activity: Not on file  Other Topics Concern   Not on file  Social History Narrative   Not on file   Social Determinants of Health   Financial Resource Strain: Not on file  Food Insecurity: Not on file  Transportation Needs: Not on file  Physical Activity: Not on file  Stress: Not on file  Social Connections: Not on file     Family History: The patient's family history includes Breast cancer in his paternal grandfather; Breast cancer (age of onset: 37) in his sister; Breast cancer (age of onset: 39) in his mother; Heart failure in his maternal grandfather; High Cholesterol in his father; Kidney Stones in his father; Kidney cancer in his mother; Lung cancer  in his mother; Other in his sister; Skin cancer in his mother; Throat cancer in his paternal grandfather and other family members.  ROS:   Please see the history of present illness.    All other systems reviewed and are negative.  EKGs/Labs/Other Studies Reviewed:    The following studies were reviewed today: EKG reveals sinus rhythm and nonspecific ST-T changes   Recent Labs: 11/11/2022: ALT 14; BUN 14; Creatinine, Ser 0.87; Hemoglobin 13.2; Platelets 296.0; Potassium 4.8; Sodium 139  Recent Lipid Panel    Component Value Date/Time   CHOL 121 11/11/2022 1620   TRIG 67.0 11/11/2022 1620   HDL 47.20 11/11/2022 1620   CHOLHDL 3 11/11/2022 1620   VLDL 13.4 11/11/2022 1620   LDLCALC 61 11/11/2022 1620   LDLDIRECT 67.0 03/23/2018 0854    Physical Exam:    VS:  BP 124/66   Pulse 76   Ht '5\' 7"'$  (1.702 m)   Wt 168 lb 1.9 oz (76.3 kg)   SpO2 97%   BMI 26.33 kg/m     Wt Readings from Last 3 Encounters:  12/28/22 168 lb 1.9 oz (76.3 kg)  11/11/22 170 lb 6.4 oz (77.3 kg)  11/20/21 163 lb (73.9 kg)     GEN: Patient is in no acute distress HEENT: Normal NECK: No JVD;  No carotid bruits LYMPHATICS: No lymphadenopathy CARDIAC: S1 S2 regular, 2/6 systolic murmur at the apex. RESPIRATORY:  Clear to auscultation without rales, wheezing or rhonchi  ABDOMEN: Soft, non-tender, non-distended MUSCULOSKELETAL:  No edema; No deformity  SKIN: Warm and dry NEUROLOGIC:  Alert and oriented x 3 PSYCHIATRIC:  Normal affect    Signed, Jenean Lindau, MD  12/28/2022 9:21 AM    [place]

## 2022-12-28 NOTE — Patient Instructions (Addendum)
Medication Instructions:  Your physician has recommended you make the following change in your medication:   Start 81 mg coated aspirin daily  *If you need a refill on your cardiac medications before your next appointment, please call your pharmacy*   Lab Work: None ordered If you have labs (blood work) drawn today and your tests are completely normal, you will receive your results only by: [place] (if you have MyChart) OR A paper copy in the mail If you have any lab test that is abnormal or we need to change your treatment, we will call you to review the results.   Testing/Procedures: You are scheduled for a Myocardial Perfusion Imaging Study.  Please arrive 15 minutes prior to your appointment time for registration and insurance purposes.  The test will take approximately 3 to 4 hours to complete; you may bring reading material.  If someone comes with you to your appointment, they will need to remain in the main lobby due to limited space in the testing area.   How to prepare for your Myocardial Perfusion Test: Do not eat or drink 3 hours prior to your test, except you may have water. Do not consume products containing caffeine (regular or decaffeinated) 12 hours prior to your test. (ex: coffee, chocolate, sodas, tea). Do bring a list of your current medications with you.  If not listed below, you may take your medications as normal. Do wear comfortable clothes (no dresses or overalls) and walking shoes, tennis shoes preferred (No heels or open toe shoes are allowed). Do NOT wear cologne, perfume, aftershave, or lotions (deodorant is allowed). If these instructions are not followed, your test will have to be rescheduled.  If you cannot keep your appointment, please provide 24 hours notification to the Nuclear Lab, to avoid a possible $50 charge to your account.  Your physician has requested that you have an abdominal aorta duplex. During this test, an ultrasound is used to  evaluate the aorta. Allow 30 minutes for this exam. Do not eat after midnight the day before and avoid carbonated beverages  Follow-Up: At [facility] you and your health needs are our priority.  As part of our continuing mission to provide you with exceptional heart care, we have created designated Provider Care Teams.  These Care Teams include your primary Cardiologist (physician) and Advanced Practice Providers (APPs -  Physician Assistants and Nurse Practitioners) who all work together to provide you with the care you need, when you need it.  We recommend signing up for the patient portal called "MyChart".  Sign up information is provided on this After Visit Summary.  MyChart is used to connect with patients for Virtual Visits (Telemedicine).  Patients are able to view lab/test results, encounter notes, upcoming appointments, etc.  Non-urgent messages can be sent to your provider as well.   To learn more about what you can do with MyChart, go to [email or web address].    Your next appointment:   9 month(s)  Provider:   Jyl Heinz, MD   Other Instructions  Cardiac Nuclear Scan A cardiac nuclear scan is a test that is done to check the flow of blood to your heart. It is done when you are resting and when you are exercising. The test looks for problems such as: Not enough blood reaching a portion of the heart. The heart muscle not working as it should. You may need this test if you have: Heart disease. Lab results that are not normal. Had heart surgery  or a balloon procedure to open up blocked arteries (angioplasty) or a small mesh tube (stent). Chest pain. Shortness of breath. Had a heart attack. In this test, a special dye (tracer) is put into your bloodstream. The tracer will travel to your heart. A camera will then take pictures of your heart to see how the tracer moves through your heart. This test is usually done at a hospital and takes 2-4 hours. Tell a doctor  about: Any allergies you have. All medicines you are taking, including vitamins, herbs, eye drops, creams, and over-the-counter medicines. Any bleeding problems you have. Any surgeries you have had. Any medical conditions you have. Whether you are pregnant or may be pregnant. Any history of asthma or long-term (chronic) lung disease. Any history of heart rhythm disorders or heart valve conditions. What are the risks? Your doctor will talk with you about risks. These may include: Serious chest pain and heart attack. This is only a risk if the stress portion of the test is done. Fast or uneven heartbeats (palpitations). A feeling of warmth in your chest. This feeling usually does not last long. Allergic reaction to the tracer. Shortness of breath or trouble breathing. What happens before the test? Ask your doctor about changing or stopping your normal medicines. Follow instructions from your doctor about what you cannot eat or drink. Remove your jewelry on the day of the test. Ask your doctor if you need to avoid nicotine or caffeine. What happens during the test? An IV tube will be inserted into one of your veins. Your doctor will give you a small amount of tracer through the IV tube. You will wait for 20-40 minutes while the tracer moves through your bloodstream. Your heart will be monitored with an electrocardiogram (ECG). You will lie down on an exam table. Pictures of your heart will be taken for about 15-20 minutes. You may also have a stress test. For this test, one of these things may be done: You will be asked to exercise on a treadmill or a stationary bike. You will be given medicines that will make your heart work harder. This is done if you are unable to exercise. When blood flow to your heart has peaked, a tracer will again be given through the IV tube. After 20-40 minutes, you will get back on the exam table. More pictures will be taken of your heart. Depending on the  tracer that is used, more pictures may need to be taken 3-4 hours later. Your IV tube will be removed when the test is over. The test may vary among doctors and hospitals. What happens after the test? Ask your doctor: Whether you can return to your normal schedule, including diet, activities, travel, and medicines. Whether you should drink more fluids. This will help to remove the tracer from your body. Ask your doctor, or the department that is doing the test: When will my results be ready? How will I get my results? What are my treatment options? What other tests do I need? What are my next steps? This information is not intended to replace advice given to you by your health care provider. Make sure you discuss any questions you have with your health care provider. Document Revised: 03/16/2022 Document Reviewed: 03/16/2022 Elsevier Patient Education  [place].

## 2023-01-04 ENCOUNTER — Telehealth (HOSPITAL_COMMUNITY): Payer: Self-pay | Admitting: *Deleted

## 2023-01-04 ENCOUNTER — Other Ambulatory Visit: Payer: Self-pay | Admitting: Cardiology

## 2023-01-04 ENCOUNTER — Ambulatory Visit (HOSPITAL_COMMUNITY)
Admission: RE | Admit: 2023-01-04 | Discharge: 2023-01-04 | Disposition: A | Discharge status: Home / Self Care | Payer: Self-pay | Source: Ambulatory Visit | Attending: Cardiology | Admitting: Cardiology

## 2023-01-04 DIAGNOSIS — I10 Essential (primary) hypertension: Secondary | ICD-10-CM | POA: Insufficient documentation

## 2023-01-04 DIAGNOSIS — R079 Chest pain, unspecified: Secondary | ICD-10-CM | POA: Insufficient documentation

## 2023-01-04 DIAGNOSIS — I2510 Atherosclerotic heart disease of native coronary artery without angina pectoris: Secondary | ICD-10-CM

## 2023-01-04 DIAGNOSIS — I700 Atherosclerosis of aorta: Secondary | ICD-10-CM

## 2023-01-04 DIAGNOSIS — F17200 Nicotine dependence, unspecified, uncomplicated: Secondary | ICD-10-CM | POA: Insufficient documentation

## 2023-01-04 DIAGNOSIS — E785 Hyperlipidemia, unspecified: Secondary | ICD-10-CM

## 2023-01-04 NOTE — Telephone Encounter (Signed)
Per DPR left detailed instructions on cell phone vm for MPI study scheduled on 01/06/23.

## 2023-01-06 ENCOUNTER — Ambulatory Visit (HOSPITAL_COMMUNITY): Payer: Self-pay | Attending: Cardiovascular Disease

## 2023-01-06 DIAGNOSIS — R079 Chest pain, unspecified: Secondary | ICD-10-CM | POA: Insufficient documentation

## 2023-01-06 LAB — MYOCARDIAL PERFUSION IMAGING
Angina Index: 0
Duke Treadmill Score: 11
Estimated workload: 11.8
Exercise duration (min): 10 min
Exercise duration (sec): 31 s
LV dias vol: 80 mL (ref 62–150)
LV sys vol: 29 mL
MPHR: 163 {beats}/min
Nuc Stress EF: 64 %
Peak HR: 150 {beats}/min
Percent HR: 92 %
Rest HR: 65 {beats}/min
Rest Nuclear Isotope Dose: 10.8 mCi
SDS: 0
SRS: 0
SSS: 0
ST Depression (mm): 0 mm
Stress Nuclear Isotope Dose: 30.5 mCi
TID: 0.87

## 2023-01-06 MED ORDER — TECHNETIUM TC 99M TETROFOSMIN IV KIT
10.8000 | PACK | Freq: Once | INTRAVENOUS | Status: AC | PRN
  Administered 2023-01-06: 10.8 via INTRAVENOUS

## 2023-01-06 MED ORDER — TECHNETIUM TC 99M TETROFOSMIN IV KIT
30.5000 | PACK | Freq: Once | INTRAVENOUS | Status: AC | PRN
  Administered 2023-01-06: 30.5 via INTRAVENOUS

## 2023-02-14 ENCOUNTER — Encounter: Payer: Self-pay | Admitting: *Deleted

## 2023-08-23 ENCOUNTER — Encounter: Payer: Self-pay | Admitting: Family Medicine

## 2023-08-23 DIAGNOSIS — N529 Male erectile dysfunction, unspecified: Secondary | ICD-10-CM

## 2023-08-23 MED ORDER — SILDENAFIL CITRATE 20 MG PO TABS: ORAL_TABLET | ORAL | 2 refills | Status: AC

## 2023-09-02 ENCOUNTER — Other Ambulatory Visit: Payer: Self-pay | Admitting: Family Medicine

## 2023-09-02 DIAGNOSIS — K219 Gastro-esophageal reflux disease without esophagitis: Secondary | ICD-10-CM

## 2023-09-02 DIAGNOSIS — M109 Gout, unspecified: Secondary | ICD-10-CM

## 2023-09-02 DIAGNOSIS — I10 Essential (primary) hypertension: Secondary | ICD-10-CM

## 2023-11-11 ENCOUNTER — Other Ambulatory Visit: Payer: Self-pay | Admitting: Family Medicine

## 2023-11-11 DIAGNOSIS — F5101 Primary insomnia: Secondary | ICD-10-CM

## 2023-11-21 ENCOUNTER — Other Ambulatory Visit: Payer: Self-pay | Admitting: Family Medicine

## 2023-11-21 DIAGNOSIS — R931 Abnormal findings on diagnostic imaging of heart and coronary circulation: Secondary | ICD-10-CM

## 2023-11-30 ENCOUNTER — Other Ambulatory Visit: Payer: Self-pay | Admitting: Family Medicine

## 2023-11-30 DIAGNOSIS — K219 Gastro-esophageal reflux disease without esophagitis: Secondary | ICD-10-CM

## 2023-11-30 DIAGNOSIS — I10 Essential (primary) hypertension: Secondary | ICD-10-CM

## 2023-11-30 DIAGNOSIS — M109 Gout, unspecified: Secondary | ICD-10-CM

## 2023-12-06 ENCOUNTER — Encounter: Payer: Self-pay | Admitting: Family Medicine

## 2023-12-06 DIAGNOSIS — I10 Essential (primary) hypertension: Secondary | ICD-10-CM

## 2023-12-06 DIAGNOSIS — K219 Gastro-esophageal reflux disease without esophagitis: Secondary | ICD-10-CM

## 2023-12-06 DIAGNOSIS — M109 Gout, unspecified: Secondary | ICD-10-CM

## 2023-12-06 MED ORDER — ALLOPURINOL 300 MG PO TABS: 300.0000 mg | ORAL_TABLET | Freq: Every day | ORAL | 0 refills | Status: DC

## 2023-12-06 MED ORDER — OMEPRAZOLE 40 MG PO CPDR: 40.0000 mg | DELAYED_RELEASE_CAPSULE | Freq: Every day | ORAL | 0 refills | Status: DC

## 2023-12-06 MED ORDER — LISINOPRIL 20 MG PO TABS: 20.0000 mg | ORAL_TABLET | Freq: Every day | ORAL | 0 refills | Status: DC

## 2023-12-21 ENCOUNTER — Ambulatory Visit: Payer: Self-pay | Admitting: Cardiology

## 2023-12-21 NOTE — Progress Notes (Unsigned)
 [facility] [address], [address] [place], [place] [address] [phone]  Date:  12/22/2023   Name:  Timothy Mcintosh   DOB:  02-17-1966   MRN:  [id number]  PCP:  Pearline Cables, MD    Chief Complaint: No chief complaint on file.   History of Present Illness:  Timothy Mcintosh is a 58 y.o. 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 Dierdre Forth- he is not currently under any treatment as nothing seemed to work  He has 3 daughters , 1 son and 7 grandchildren- range from 35 to 87 years old   Seen by cardiology also one year ago: Chest pain: Coronary atherosclerosis: Aortic atherosclerosis: Secondary prevention stressed with the patient.  Importance of compliance with diet medication stressed any vocalized understanding.  His symptoms are atypical in nature however in view of risk factors and these findings we will do an exercise stress Cardiolite and he is agreeable. Mixed dyslipidemia: On lipid-lowering medications.  Lipids are at goal.  Diet emphasized. Essential hypertension: Blood pressure stable and diet was emphasized.  Lifestyle modification urged.  Salt intake issues and diet emphasized. Cigarette smoker: I spent 5 minutes with the patient discussing solely about smoking. Smoking cessation was counseled. I suggested to the patient also different medications and pharmacological interventions. Patient is keen to try stopping on its own at this time. He will get back to me if he needs any further assistance in this matter. Abdominal abnormal pulsations: We will get a sonogram to assess for any presence of aneurysm.  He is a  smoker. Patient will be seen in follow-up appointment in 6 months or earlier if the patient has any concerns   Flu vaccine Shingrix Lung cancer screening:  Labs one year ago  Time to update lung cancer screening CT coronary one year ago: IMPRESSION: 1. Coronary calcium score of 186. This was 84th percentile for age-, race-, and sex-matched controls. 2. Aortic atherosclerosis  Patient Active Problem List   Diagnosis Date Noted   Aortic atherosclerosis (HCC) 12/28/2022   Coronary atherosclerosis 12/28/2022   Chest pain of uncertain etiology 12/28/2022   Prominent abdominal aortic pulsation 12/28/2022   Headache 12/22/2022   Vision abnormalities 12/22/2022   Prediabetes 11/12/2022   COVID-19 11/20/2021   Status post total hip replacement, left 12/14/2019   OA (osteoarthritis) of knee 09/04/2019   Primary osteoarthritis of left hip 09/04/2019   Left lower quadrant abdominal pain 09/04/2019   Monoallelic mutation of PALB2 gene 05/31/2018   Genetic testing 05/31/2018   Hypertension 05/16/2018   Left elbow pain 05/16/2018   Family history of genetic disease carrier    Family history of breast cancer    Family history of lung cancer    Family history of throat cancer    General weakness 04/10/2018   Leucocytosis 04/10/2018   Jaw pain 04/10/2018   Post concussion syndrome 09/15/2017   Restless leg syndrome 09/15/2017   Rheumatoid arthritis (HCC) 04/01/2016   Dyslipidemia 12/25/2015   Tobacco use disorder 12/25/2015   Insomnia 12/25/2015   Primary osteoarthritis of left knee 07/30/2015   GERD (gastroesophageal reflux disease) 03/04/2014   Major depression  03/04/2014   Reactive airway disease 09/19/2013   Asthmatic bronchitis 09/19/2013   ED (erectile dysfunction) 04/19/2013   Dorsalgia 11/21/2011   Hyperlipidemia 11/21/2011   Gout 11/21/2011   GAD (generalized anxiety disorder) 11/21/2011   Hyperlipidemia with target LDL less than 100 11/21/2011   Back pain, chronic  11/21/2011    Past Medical History:  Diagnosis Date   COVID-19 11/20/2021   Dorsalgia 11/21/2011   Dyslipidemia 12/25/2015   ED (erectile dysfunction) 04/19/2013   Family history of breast cancer    Family history of genetic disease carrier    sister PALB2+   Family history of lung cancer    Family history of throat cancer    GAD (generalized anxiety disorder) 11/21/2011   Genetic testing 05/31/2018   19/The Common Hereditary Cancers Panel + Renal/Urinary Tract Cancers Panel was ordered.  The following genes were evaluated for sequence changes and exonic deletions/duplications: APC, ATM, AXIN2, BAP1, BARD1, BMPR1A, BRCA1, BRCA2, BRIP1, CDC73, CDH1, CDK4, CDKN1C, CDKN2A (p14ARF), CDKN2A (p16INK4a), CHEK2, CTNNA1, DICER1, DIS3L2, EPCAM*, FH, FLCN, GPC3, GREM1*, HOXB13, KIT, MEN1, MET, MLH1, MSH2,   GERD (gastroesophageal reflux disease) 03/04/2014   Gout 11/21/2011   Headache    Hyperlipidemia    Hyperlipidemia with target LDL less than 100 11/21/2011   Formatting of this note might be different from the original. ICD-10 cut over   Hypertension    Insomnia 12/25/2015   Stable on trazodone   Left elbow pain 05/16/2018   Left lower quadrant abdominal pain 09/04/2019   Major depression 03/04/2014   Monoallelic mutation of PALB2 gene 05/31/2018   Deletion (Exon 7)   OA (osteoarthritis) of knee 09/04/2019   Post concussion syndrome 09/15/2017   Prediabetes 11/12/2022   Primary osteoarthritis of left hip 09/04/2019   Primary osteoarthritis of left knee 07/30/2015   Overview:   Films 07/2015.   Reactive airway disease 09/19/2013   Restless leg syndrome 09/15/2017   Rheumatoid arthritis (HCC)    Status post total hip replacement, left 12/14/2019   Tobacco use disorder 12/25/2015   Vision abnormalities     Past Surgical History:  Procedure Laterality Date   BACK SURGERY Bilateral    c5 -c6   CARPAL TUNNEL RELEASE Right    KNEE SURGERY Bilateral    TOTAL HIP ARTHROPLASTY Left  12/14/2019   Procedure: TOTAL HIP ARTHROPLASTY ANTERIOR APPROACH;  Surgeon: Jodi Geralds, MD;  Location: [facility];  Service: Orthopedics;  Laterality: Left;    Social History   Tobacco Use   Smoking status: Every Day   Smokeless tobacco: Never   Tobacco comments:    smoking since age 34  Vaping Use   Vaping status: Never Used  Substance Use Topics   Alcohol use: No    Alcohol/week: 0.0 standard drinks of alcohol   Drug use: No    Family History  Problem Relation Age of Onset   High Cholesterol Father    Kidney Stones Father    Heart failure Maternal Grandfather        Diet age 81 of "massive heart failure" unknown if MI or not   Lung cancer Mother    Breast cancer Mother 3   Kidney cancer Mother    Skin cancer Mother    Breast cancer Sister 71       PALB2+   Breast cancer Paternal Grandfather        63's   Throat cancer Paternal Grandfather    Other Sister        PALB2+  Throat cancer Other    Throat cancer Other     No Known Allergies  Medication list has been reviewed and updated.  Current Outpatient Medications on File Prior to Visit  Medication Sig Dispense Refill   allopurinol (ZYLOPRIM) 300 MG tablet Take 1 tablet (300 mg total) by mouth daily. 30 tablet 0   aspirin EC 81 MG tablet Take 1 tablet (81 mg total) by mouth daily. Swallow whole. 90 tablet 3   doxycycline (VIBRAMYCIN) 100 MG capsule Take 1 capsule (100 mg total) by mouth 2 (two) times daily. (Patient not taking: Reported on 12/28/2022) 20 capsule 0   lisinopril (ZESTRIL) 20 MG tablet Take 1 tablet (20 mg total) by mouth daily. 30 tablet 0   Multiple Vitamin (MULTIVITAMIN) capsule Take 1 capsule by mouth daily.      omeprazole (PRILOSEC) 40 MG capsule Take 1 capsule (40 mg total) by mouth daily. 30 capsule 0   rosuvastatin (CRESTOR) 20 MG tablet Take 1 tablet (20 mg total) by mouth daily. Appt for further refills 90 tablet 0   sildenafil (REVATIO) 20 MG tablet TAKE 1 TO 5 TABLETS BY MOUTH DAILY AS  NEEDED FOR ERECTILE DYSFUNCTION 50 tablet 2   SUBOXONE 8-2 MG FILM Place 1 Film under the tongue 2 (two) times daily.   0   traZODone (DESYREL) 100 MG tablet Take 1 tablet (100 mg total) by mouth at bedtime. 90 tablet 0   No current facility-administered medications on file prior to visit.    Review of Systems:  As per HPI- otherwise negative.   Physical Examination: There were no vitals filed for this visit. There were no vitals filed for this visit. There is no height or weight on file to calculate BMI. Ideal Body Weight:    GEN: no acute distress. HEENT: Atraumatic, Normocephalic.  Ears and Nose: No external deformity. CV: RRR, No M/G/R. No JVD. No thrill. No extra heart sounds. PULM: CTA B, no wheezes, crackles, rhonchi. No retractions. No resp. distress. No accessory muscle use. ABD: S, NT, ND, +BS. No rebound. No HSM. EXTR: No c/c/e PSYCH: Normally interactive. Conversant.    Assessment and Plan: *** Following up today- Will plan further follow- up pending labs.  Signed Abbe Amsterdam, MD

## 2023-12-21 NOTE — Patient Instructions (Incomplete)
 Good to see you again today Recommend a covid booster if none in the last 6 months or so Also recommend a flu shot and start shingles series asap once you are feeling better  Assuming all is well please see me in about 6 months

## 2023-12-22 ENCOUNTER — Ambulatory Visit (INDEPENDENT_AMBULATORY_CARE_PROVIDER_SITE_OTHER): Payer: No Typology Code available for payment source | Admitting: Family Medicine

## 2023-12-22 ENCOUNTER — Encounter: Payer: Self-pay | Admitting: Family Medicine

## 2023-12-22 VITALS — BP 118/75 | HR 76 | Temp 98.1°F | Ht 67.0 in | Wt 168.0 lb

## 2023-12-22 DIAGNOSIS — R051 Acute cough: Secondary | ICD-10-CM | POA: Diagnosis not present

## 2023-12-22 DIAGNOSIS — R972 Elevated prostate specific antigen [PSA]: Secondary | ICD-10-CM

## 2023-12-22 DIAGNOSIS — Z125 Encounter for screening for malignant neoplasm of prostate: Secondary | ICD-10-CM

## 2023-12-22 DIAGNOSIS — E785 Hyperlipidemia, unspecified: Secondary | ICD-10-CM

## 2023-12-22 DIAGNOSIS — Z131 Encounter for screening for diabetes mellitus: Secondary | ICD-10-CM | POA: Diagnosis not present

## 2023-12-22 DIAGNOSIS — I10 Essential (primary) hypertension: Secondary | ICD-10-CM

## 2023-12-22 DIAGNOSIS — E875 Hyperkalemia: Secondary | ICD-10-CM

## 2023-12-22 DIAGNOSIS — K219 Gastro-esophageal reflux disease without esophagitis: Secondary | ICD-10-CM

## 2023-12-22 DIAGNOSIS — Z0000 Encounter for general adult medical examination without abnormal findings: Secondary | ICD-10-CM | POA: Diagnosis not present

## 2023-12-22 DIAGNOSIS — D72829 Elevated white blood cell count, unspecified: Secondary | ICD-10-CM

## 2023-12-22 DIAGNOSIS — F5101 Primary insomnia: Secondary | ICD-10-CM

## 2023-12-22 DIAGNOSIS — R931 Abnormal findings on diagnostic imaging of heart and coronary circulation: Secondary | ICD-10-CM

## 2023-12-22 DIAGNOSIS — F17200 Nicotine dependence, unspecified, uncomplicated: Secondary | ICD-10-CM

## 2023-12-22 LAB — COMPREHENSIVE METABOLIC PANEL
ALT: 19 U/L (ref 0–53)
AST: 20 U/L (ref 0–37)
Albumin: 4.4 g/dL (ref 3.5–5.2)
Alkaline Phosphatase: 119 U/L — ABNORMAL HIGH (ref 39–117)
BUN: 17 mg/dL (ref 6–23)
CO2: 28 meq/L (ref 19–32)
Calcium: 9.2 mg/dL (ref 8.4–10.5)
Chloride: 102 meq/L (ref 96–112)
Creatinine, Ser: 0.94 mg/dL (ref 0.40–1.50)
GFR: 89.15 mL/min (ref >60.00)
Glucose, Bld: 102 mg/dL — ABNORMAL HIGH (ref 70–99)
Potassium: 5.7 meq/L — ABNORMAL HIGH (ref 3.5–5.1)
Sodium: 137 meq/L (ref 135–145)
Total Bilirubin: 0.3 mg/dL (ref 0.2–1.2)
Total Protein: 6.6 g/dL (ref 6.0–8.3)

## 2023-12-22 LAB — POCT INFLUENZA A/B
Influenza A, POC: NEGATIVE
Influenza B, POC: NEGATIVE

## 2023-12-22 LAB — CBC
HCT: 40.5 % (ref 39.0–52.0)
Hemoglobin: 13.5 g/dL (ref 13.0–17.0)
MCHC: 33.4 g/dL (ref 30.0–36.0)
MCV: 89.9 fL (ref 78.0–100.0)
Platelets: 282 10*3/uL (ref 150.0–400.0)
RBC: 4.5 Mil/uL (ref 4.22–5.81)
RDW: 13.8 % (ref 11.5–15.5)
WBC: 13.5 10*3/uL — ABNORMAL HIGH (ref 4.0–10.5)

## 2023-12-22 LAB — LIPID PANEL
Cholesterol: 91 mg/dL (ref 0–200)
HDL: 41.3 mg/dL (ref >39.00)
LDL Cholesterol: 34 mg/dL (ref 0–99)
NonHDL: 49.8
Total CHOL/HDL Ratio: 2
Triglycerides: 80 mg/dL (ref 0.0–149.0)
VLDL: 16 mg/dL (ref 0.0–40.0)

## 2023-12-22 LAB — PSA: PSA: 1.26 ng/mL (ref 0.10–4.00)

## 2023-12-22 LAB — POC COVID19 BINAXNOW: SARS Coronavirus 2 Ag: NEGATIVE

## 2023-12-22 LAB — HEMOGLOBIN A1C: Hgb A1c MFr Bld: 5.7 % (ref 4.6–6.5)

## 2023-12-22 MED ORDER — TRAZODONE HCL 100 MG PO TABS
100.0000 mg | ORAL_TABLET | Freq: Every day | ORAL | 3 refills | Status: AC
Start: 2023-12-22 — End: ?

## 2023-12-22 MED ORDER — DOXYCYCLINE HYCLATE 100 MG PO CAPS: 100.0000 mg | ORAL_CAPSULE | Freq: Two times a day (BID) | ORAL | 0 refills | Status: DC

## 2023-12-22 MED ORDER — ROSUVASTATIN CALCIUM 20 MG PO TABS
20.0000 mg | ORAL_TABLET | Freq: Every day | ORAL | 3 refills | Status: AC
Start: 2023-12-22 — End: ?

## 2023-12-22 MED ORDER — OMEPRAZOLE 40 MG PO CPDR: 40.0000 mg | DELAYED_RELEASE_CAPSULE | Freq: Every day | ORAL | 3 refills | Status: DC

## 2023-12-23 ENCOUNTER — Encounter: Payer: Self-pay | Admitting: Family Medicine

## 2023-12-23 NOTE — Addendum Note (Signed)
 Addended by: Pearline Cables on: 12/23/2023 12:37 PM   Modules accepted: Orders

## 2023-12-26 ENCOUNTER — Other Ambulatory Visit: Payer: Self-pay | Admitting: Family Medicine

## 2023-12-26 DIAGNOSIS — M109 Gout, unspecified: Secondary | ICD-10-CM

## 2023-12-28 ENCOUNTER — Telehealth (HOSPITAL_BASED_OUTPATIENT_CLINIC_OR_DEPARTMENT_OTHER): Payer: Self-pay

## 2023-12-29 ENCOUNTER — Ambulatory Visit (HOSPITAL_BASED_OUTPATIENT_CLINIC_OR_DEPARTMENT_OTHER): Payer: No Typology Code available for payment source

## 2024-01-05 ENCOUNTER — Ambulatory Visit: Payer: Self-pay | Admitting: Cardiology

## 2024-01-09 ENCOUNTER — Other Ambulatory Visit: Payer: Self-pay | Admitting: Family Medicine

## 2024-01-09 DIAGNOSIS — I10 Essential (primary) hypertension: Secondary | ICD-10-CM

## 2024-06-17 NOTE — Patient Instructions (Incomplete)
 Good to see you today!  Recommend flu shot this fall and the shingles vaccine series

## 2024-06-17 NOTE — Progress Notes (Deleted)
 [facility] [address] [place], [place] [address] [phone]  Date:  06/21/2024   Name:  Timothy Mcintosh   DOB:  [birth year]   MRN:  [id number]  PCP:  Watt Harlene BROCKS, MD    Chief Complaint: No chief complaint on file.   History of Present Illness:  Timothy Mcintosh is a 58 y.o. 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 Mai- he is not currently under any treatment as nothing seemed to work  He has 3 daughters , 1 son and 7 grandchildren- range from 75 to 76 years old  His son is engaged to be married!  Following up today- I last saw Clarke in February   Lung cancer screening 2/24- needs update  Can update labs today  Colon- colonoscopy in 2017 Recommend flu shot his fall  CT coronary done in 2024  Patient Active Problem List   Diagnosis Date Noted   Aortic atherosclerosis (HCC) 12/28/2022   Coronary atherosclerosis 12/28/2022   Chest pain of uncertain etiology 12/28/2022   Prominent abdominal aortic pulsation 12/28/2022   Headache 12/22/2022   Vision abnormalities 12/22/2022   Prediabetes 11/12/2022   COVID-19 11/20/2021   Status post total hip replacement, left 12/14/2019   OA (osteoarthritis) of knee 09/04/2019   Primary osteoarthritis of left hip 09/04/2019   Left lower quadrant abdominal pain 09/04/2019   Monoallelic mutation of PALB2 gene 05/31/2018   Genetic testing 05/31/2018   Hypertension 05/16/2018   Left elbow pain 05/16/2018   Family history of genetic disease carrier    Family history of breast cancer    Family history of lung cancer    Family history of throat cancer    General weakness  04/10/2018   Leucocytosis 04/10/2018   Jaw pain 04/10/2018   Post concussion syndrome 09/15/2017   Restless leg syndrome 09/15/2017   Rheumatoid arthritis (HCC) 04/01/2016   Dyslipidemia 12/25/2015   Tobacco use disorder 12/25/2015   Insomnia 12/25/2015   Primary osteoarthritis of left knee 07/30/2015   GERD (gastroesophageal reflux disease) 03/04/2014   Major depression 03/04/2014   Reactive airway disease 09/19/2013   Asthmatic bronchitis 09/19/2013   ED (erectile dysfunction) 04/19/2013   Dorsalgia 11/21/2011   Hyperlipidemia 11/21/2011   Gout 11/21/2011   GAD (generalized anxiety disorder) 11/21/2011   Hyperlipidemia with target LDL less than 100 11/21/2011   Back pain, chronic 11/21/2011    Past Medical History:  Diagnosis Date   COVID-19 11/20/2021   Dorsalgia 11/21/2011   Dyslipidemia 12/25/2015   ED (erectile dysfunction) 04/19/2013   Family history of breast cancer    Family history of genetic disease carrier    sister PALB2+   Family history of lung cancer    Family history of throat cancer    GAD (generalized anxiety disorder) 11/21/2011   Genetic testing 05/31/2018   19/The Common Hereditary Cancers Panel + Renal/Urinary Tract Cancers Panel was ordered.  The following genes were evaluated for sequence changes and exonic deletions/duplications: APC, ATM, AXIN2, BAP1, BARD1, BMPR1A, BRCA1, BRCA2, BRIP1, CDC73, CDH1, CDK4, CDKN1C, CDKN2A (p14ARF), CDKN2A (p16INK4a), CHEK2, CTNNA1, DICER1, DIS3L2, EPCAM*, FH, FLCN, GPC3, GREM1*, HOXB13, KIT, MEN1, MET, MLH1, MSH2,  GERD (gastroesophageal reflux disease) 03/04/2014   Gout 11/21/2011   Headache    Hyperlipidemia    Hyperlipidemia with target LDL less than 100 11/21/2011   Formatting of this note might be different from the original. ICD-10 cut over   Hypertension    Insomnia 12/25/2015   Stable on trazodone    Left elbow pain 05/16/2018   Left lower quadrant abdominal pain 09/04/2019   Major depression 03/04/2014    Monoallelic mutation of PALB2 gene 05/31/2018   Deletion (Exon 7)   OA (osteoarthritis) of knee 09/04/2019   Post concussion syndrome 09/15/2017   Prediabetes 11/12/2022   Primary osteoarthritis of left hip 09/04/2019   Primary osteoarthritis of left knee 07/30/2015   Overview:   Films 07/2015.   Reactive airway disease 09/19/2013   Restless leg syndrome 09/15/2017   Rheumatoid arthritis (HCC)    Status post total hip replacement, left 12/14/2019   Tobacco use disorder 12/25/2015   Vision abnormalities     Past Surgical History:  Procedure Laterality Date   BACK SURGERY Bilateral    c5 -c6   CARPAL TUNNEL RELEASE Right    KNEE SURGERY Bilateral    TOTAL HIP ARTHROPLASTY Left 12/14/2019   Procedure: TOTAL HIP ARTHROPLASTY ANTERIOR APPROACH;  Surgeon: Yvone Rush, MD;  Location: [facility];  Service: Orthopedics;  Laterality: Left;    Social History   Tobacco Use   Smoking status: Every Day   Smokeless tobacco: Never   Tobacco comments:    smoking since age 77  Vaping Use   Vaping status: Never Used  Substance Use Topics   Alcohol use: No    Alcohol/week: 0.0 standard drinks of alcohol   Drug use: No    Family History  Problem Relation Age of Onset   High Cholesterol Father    Kidney Stones Father    Heart failure Maternal Grandfather        Diet age 40 of massive heart failure unknown if MI or not   Lung cancer Mother    Breast cancer Mother 12   Kidney cancer Mother    Skin cancer Mother    Breast cancer Sister 32       PALB2+   Breast cancer Paternal Grandfather        25's   Throat cancer Paternal Grandfather    Other Sister        PALB2+   Throat cancer Other    Throat cancer Other     No Known Allergies  Medication list has been reviewed and updated.  Current Outpatient Medications on File Prior to Visit  Medication Sig Dispense Refill   allopurinol  (ZYLOPRIM ) 300 MG tablet TAKE 1 TABLET BY MOUTH DAILY 90 tablet 1   aspirin  EC 81 MG tablet Take  1 tablet (81 mg total) by mouth daily. Swallow whole. 90 tablet 3   doxycycline  (VIBRAMYCIN ) 100 MG capsule Take 1 capsule (100 mg total) by mouth 2 (two) times daily. 20 capsule 0   lisinopril  (ZESTRIL ) 20 MG tablet TAKE 1 TABLET BY MOUTH DAILY 90 tablet 1   Multiple Vitamin (MULTIVITAMIN) capsule Take 1 capsule by mouth daily.      omeprazole  (PRILOSEC) 40 MG capsule Take 1 capsule (40 mg total) by mouth daily. 90 capsule 3   rosuvastatin  (CRESTOR ) 20 MG tablet Take 1 tablet (20 mg total) by mouth daily. 90 tablet 3   sildenafil  (REVATIO ) 20 MG tablet TAKE 1 TO 5 TABLETS BY MOUTH DAILY AS NEEDED FOR ERECTILE DYSFUNCTION 50  tablet 2   SUBOXONE  8-2 MG FILM Place 1 Film under the tongue 2 (two) times daily.   0   traZODone  (DESYREL ) 100 MG tablet Take 1 tablet (100 mg total) by mouth at bedtime. 90 tablet 3   No current facility-administered medications on file prior to visit.    Review of Systems:  As per HPI- otherwise negative.   Physical Examination: There were no vitals filed for this visit. There were no vitals filed for this visit. There is no height or weight on file to calculate BMI. Ideal Body Weight:    GEN: no acute distress. HEENT: Atraumatic, Normocephalic.  Ears and Nose: No external deformity. CV: RRR, No M/G/R. No JVD. No thrill. No extra heart sounds. PULM: CTA B, no wheezes, crackles, rhonchi. No retractions. No resp. distress. No accessory muscle use. ABD: S, NT, ND, +BS. No rebound. No HSM. EXTR: No c/c/e PSYCH: Normally interactive. Conversant.    Assessment and Plan: ***  Signed Harlene Schroeder, MD

## 2024-06-21 ENCOUNTER — Ambulatory Visit: Payer: No Typology Code available for payment source | Admitting: Family Medicine

## 2024-06-30 ENCOUNTER — Other Ambulatory Visit: Payer: Self-pay | Admitting: Family Medicine

## 2024-06-30 DIAGNOSIS — M109 Gout, unspecified: Secondary | ICD-10-CM

## 2024-07-11 ENCOUNTER — Other Ambulatory Visit (HOSPITAL_COMMUNITY): Payer: Self-pay

## 2024-09-01 ENCOUNTER — Other Ambulatory Visit: Payer: Self-pay | Admitting: Family Medicine

## 2024-09-01 DIAGNOSIS — I10 Essential (primary) hypertension: Secondary | ICD-10-CM

## 2024-10-03 ENCOUNTER — Other Ambulatory Visit: Payer: Self-pay | Admitting: Family Medicine

## 2024-10-03 DIAGNOSIS — I10 Essential (primary) hypertension: Secondary | ICD-10-CM

## 2024-10-16 NOTE — Progress Notes (Unsigned)
 [facility] [address] [place], [place] [address] [phone]  Date:  10/18/2024   Name:  RUFFUS KAMAKA   DOB:  [birth year]   MRN:  [id number]  PCP:  Watt Harlene BROCKS, MD    Chief Complaint: No chief complaint on file.   History of Present Illness:  TREON KEHL is a 58 y.o. very pleasant male patient who presents with the following:  Patient seen today for medication follow-up.  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 Mai- he is not currently under any treatment as nothing seemed to work  He has 3 daughters , 1 son and 7 grandchildren  He last saw his cardiologist February 2024  Can offer pneumonia update Flu shot Tetanus Shingrix  Most recent labs completed in February of this year-we had planned for him to repeat a PSA, potassium, and CBC but this was never done  Allopurinol  Omeprazole  Crestor  Trazodone  Suboxone  per outside provider Aspirin   Discussed the use of AI scribe software for clinical note transcription with the patient, who gave verbal consent to proceed.  History of Present Illness    Patient Active Problem List   Diagnosis Date Noted   Aortic atherosclerosis 12/28/2022   Coronary atherosclerosis 12/28/2022   Chest pain of uncertain etiology 12/28/2022   Prominent abdominal aortic pulsation 12/28/2022   Headache 12/22/2022   Vision abnormalities 12/22/2022   Prediabetes 11/12/2022   COVID-19 11/20/2021   Status post total hip replacement, left 12/14/2019   OA (osteoarthritis) of knee 09/04/2019   Primary osteoarthritis of left hip 09/04/2019   Left lower quadrant abdominal pain 09/04/2019   Monoallelic mutation of PALB2 gene 05/31/2018   Genetic testing  05/31/2018   Hypertension 05/16/2018   Left elbow pain 05/16/2018   Family history of genetic disease carrier    Family history of breast cancer    Family history of lung cancer    Family history of throat cancer    General weakness 04/10/2018   Leucocytosis 04/10/2018   Jaw pain 04/10/2018   Post concussion syndrome 09/15/2017   Restless leg syndrome 09/15/2017   Rheumatoid arthritis (HCC) 04/01/2016   Dyslipidemia 12/25/2015   Tobacco use disorder 12/25/2015   Insomnia 12/25/2015   Primary osteoarthritis of left knee 07/30/2015   GERD (gastroesophageal reflux disease) 03/04/2014   Major depression 03/04/2014   Reactive airway disease 09/19/2013   Asthmatic bronchitis 09/19/2013   ED (erectile dysfunction) 04/19/2013   Dorsalgia 11/21/2011   Hyperlipidemia 11/21/2011   Gout 11/21/2011   GAD (generalized anxiety disorder) 11/21/2011   Hyperlipidemia with target LDL less than 100 11/21/2011   Back pain, chronic 11/21/2011    Past Medical History:  Diagnosis Date   COVID-19 11/20/2021   Dorsalgia 11/21/2011   Dyslipidemia 12/25/2015   ED (erectile dysfunction) 04/19/2013   Family history of breast cancer    Family history of genetic disease carrier    sister PALB2+   Family history of lung cancer    Family history of throat cancer    GAD (generalized anxiety disorder) 11/21/2011   Genetic testing 05/31/2018   19/The Common Hereditary Cancers Panel + Renal/Urinary Tract Cancers Panel was ordered.  The following genes were evaluated for sequence changes and exonic deletions/duplications: APC, ATM, AXIN2, BAP1,  BARD1, BMPR1A, BRCA1, BRCA2, BRIP1, CDC73, CDH1, CDK4, CDKN1C, CDKN2A (p14ARF), CDKN2A (p16INK4a), CHEK2, CTNNA1, DICER1, DIS3L2, EPCAM*, FH, FLCN, GPC3, GREM1*, HOXB13, KIT, MEN1, MET, MLH1, MSH2,   GERD (gastroesophageal reflux disease) 03/04/2014   Gout 11/21/2011   Headache    Hyperlipidemia    Hyperlipidemia with target LDL less than 100 11/21/2011   Formatting  of this note might be different from the original. ICD-10 cut over   Hypertension    Insomnia 12/25/2015   Stable on trazodone    Left elbow pain 05/16/2018   Left lower quadrant abdominal pain 09/04/2019   Major depression 03/04/2014   Monoallelic mutation of PALB2 gene 05/31/2018   Deletion (Exon 7)   OA (osteoarthritis) of knee 09/04/2019   Post concussion syndrome 09/15/2017   Prediabetes 11/12/2022   Primary osteoarthritis of left hip 09/04/2019   Primary osteoarthritis of left knee 07/30/2015   Overview:   Films 07/2015.   Reactive airway disease 09/19/2013   Restless leg syndrome 09/15/2017   Rheumatoid arthritis (HCC)    Status post total hip replacement, left 12/14/2019   Tobacco use disorder 12/25/2015   Vision abnormalities     Past Surgical History:  Procedure Laterality Date   BACK SURGERY Bilateral    c5 -c6   CARPAL TUNNEL RELEASE Right    KNEE SURGERY Bilateral    TOTAL HIP ARTHROPLASTY Left 12/14/2019   Procedure: TOTAL HIP ARTHROPLASTY ANTERIOR APPROACH;  Surgeon: Yvone Rush, MD;  Location: [facility];  Service: Orthopedics;  Laterality: Left;    Social History[1]  Family History  Problem Relation Age of Onset   High Cholesterol Father    Kidney Stones Father    Heart failure Maternal Grandfather        Diet age 70 of massive heart failure unknown if MI or not   Lung cancer Mother    Breast cancer Mother 64   Kidney cancer Mother    Skin cancer Mother    Breast cancer Sister 2       PALB2+   Breast cancer Paternal Grandfather        11's   Throat cancer Paternal Grandfather    Other Sister        PALB2+   Throat cancer Other    Throat cancer Other     Allergies[2]  Medication list has been reviewed and updated.  Medications Ordered Prior to Encounter[3]  Review of Systems:  As per HPI- otherwise negative.   Physical Examination: There were no vitals filed for this visit. There were no vitals filed for this visit. There is no height  or weight on file to calculate BMI. Ideal Body Weight:    GEN: no acute distress. HEENT: Atraumatic, Normocephalic.  Ears and Nose: No external deformity. CV: RRR, No M/G/R. No JVD. No thrill. No extra heart sounds. PULM: CTA B, no wheezes, crackles, rhonchi. No retractions. No resp. distress. No accessory muscle use. ABD: S, NT, ND, +BS. No rebound. No HSM. EXTR: No c/c/e PSYCH: Normally interactive. Conversant.    Assessment and Plan: No diagnosis found.  Assessment & Plan   Signed Harlene Schroeder, MD    [1]  Social History Tobacco Use   Smoking status: Every Day   Smokeless tobacco: Never   Tobacco comments:    smoking since age 72  Vaping Use   Vaping status: Never Used  Substance Use Topics   Alcohol use: No    Alcohol/week: 0.0 standard drinks of alcohol   Drug use: No  [2]  No Known Allergies [3]  Current Outpatient Medications on File Prior to Visit  Medication Sig Dispense Refill   allopurinol  (ZYLOPRIM ) 300 MG tablet TAKE 1 TABLET BY MOUTH DAILY 90 tablet 1   aspirin  EC 81 MG tablet Take 1 tablet (81 mg total) by mouth daily. Swallow whole. 90 tablet 3   doxycycline  (VIBRAMYCIN ) 100 MG capsule Take 1 capsule (100 mg total) by mouth 2 (two) times daily. 20 capsule 0   lisinopril  (ZESTRIL ) 20 MG tablet TAKE 1 TABLET BY MOUTH DAILY . NEEDS APPOINMENT FOR FURTHER REFILLS. 30 tablet 0   Multiple Vitamin (MULTIVITAMIN) capsule Take 1 capsule by mouth daily.      omeprazole  (PRILOSEC) 40 MG capsule Take 1 capsule (40 mg total) by mouth daily. 90 capsule 3   rosuvastatin  (CRESTOR ) 20 MG tablet Take 1 tablet (20 mg total) by mouth daily. 90 tablet 3   sildenafil  (REVATIO ) 20 MG tablet TAKE 1 TO 5 TABLETS BY MOUTH DAILY AS NEEDED FOR ERECTILE DYSFUNCTION 50 tablet 2   SUBOXONE  8-2 MG FILM Place 1 Film under the tongue 2 (two) times daily.   0   traZODone  (DESYREL ) 100 MG tablet Take 1 tablet (100 mg total) by mouth at bedtime. 90 tablet 3   No current  facility-administered medications on file prior to visit.

## 2024-10-18 ENCOUNTER — Ambulatory Visit (INDEPENDENT_AMBULATORY_CARE_PROVIDER_SITE_OTHER): Admitting: Family Medicine

## 2024-10-18 ENCOUNTER — Encounter: Payer: Self-pay | Admitting: Family Medicine

## 2024-10-18 VITALS — BP 140/80 | HR 82 | Ht 67.0 in | Wt 169.0 lb

## 2024-10-18 DIAGNOSIS — R1115 Cyclical vomiting syndrome unrelated to migraine: Secondary | ICD-10-CM

## 2024-10-18 DIAGNOSIS — K219 Gastro-esophageal reflux disease without esophagitis: Secondary | ICD-10-CM | POA: Diagnosis not present

## 2024-10-18 DIAGNOSIS — M109 Gout, unspecified: Secondary | ICD-10-CM | POA: Diagnosis not present

## 2024-10-18 DIAGNOSIS — Z23 Encounter for immunization: Secondary | ICD-10-CM

## 2024-10-18 DIAGNOSIS — F5101 Primary insomnia: Secondary | ICD-10-CM | POA: Diagnosis not present

## 2024-10-18 DIAGNOSIS — Z125 Encounter for screening for malignant neoplasm of prostate: Secondary | ICD-10-CM | POA: Diagnosis not present

## 2024-10-18 DIAGNOSIS — R931 Abnormal findings on diagnostic imaging of heart and coronary circulation: Secondary | ICD-10-CM | POA: Diagnosis not present

## 2024-10-18 DIAGNOSIS — I10 Essential (primary) hypertension: Secondary | ICD-10-CM | POA: Diagnosis not present

## 2024-10-18 LAB — CBC
HCT: 42.9 % (ref 39.0–52.0)
Hemoglobin: 14.2 g/dL (ref 13.0–17.0)
MCHC: 33.2 g/dL (ref 30.0–36.0)
MCV: 89.7 fl (ref 78.0–100.0)
Platelets: 255 K/uL (ref 150.0–400.0)
RBC: 4.78 Mil/uL (ref 4.22–5.81)
RDW: 14.2 % (ref 11.5–15.5)
WBC: 8.5 K/uL (ref 4.0–10.5)

## 2024-10-18 LAB — COMPREHENSIVE METABOLIC PANEL WITH GFR
ALT: 14 U/L (ref 3–53)
AST: 18 U/L (ref 5–37)
Albumin: 4.7 g/dL (ref 3.5–5.2)
Alkaline Phosphatase: 118 U/L — ABNORMAL HIGH (ref 39–117)
BUN: 15 mg/dL (ref 6–23)
CO2: 26 meq/L (ref 19–32)
Calcium: 9.8 mg/dL (ref 8.4–10.5)
Chloride: 105 meq/L (ref 96–112)
Creatinine, Ser: 0.85 mg/dL (ref 0.40–1.50)
GFR: 95.01 mL/min (ref >60.00)
Glucose, Bld: 90 mg/dL (ref 70–99)
Potassium: 4.8 meq/L (ref 3.5–5.1)
Sodium: 142 meq/L (ref 135–145)
Total Bilirubin: 0.3 mg/dL (ref 0.2–1.2)
Total Protein: 6.7 g/dL (ref 6.0–8.3)

## 2024-10-18 LAB — PSA: PSA: 0.38 ng/mL (ref 0.10–4.00)

## 2024-10-18 MED ORDER — LISINOPRIL 20 MG PO TABS: 20.0000 mg | ORAL_TABLET | Freq: Every day | ORAL | 3 refills | Status: AC

## 2024-10-18 MED ORDER — ALLOPURINOL 300 MG PO TABS: 300.0000 mg | ORAL_TABLET | Freq: Every day | ORAL | 3 refills | Status: AC

## 2024-10-18 MED ORDER — ROSUVASTATIN CALCIUM 20 MG PO TABS: 20.0000 mg | ORAL_TABLET | Freq: Every day | ORAL | 3 refills | Status: AC

## 2024-10-18 MED ORDER — OMEPRAZOLE 40 MG PO CPDR: 40.0000 mg | DELAYED_RELEASE_CAPSULE | Freq: Every day | ORAL | 3 refills | Status: AC

## 2024-10-18 MED ORDER — TRAZODONE HCL 100 MG PO TABS: 100.0000 mg | ORAL_TABLET | Freq: Every day | ORAL | 3 refills | Status: AC

## 2024-10-18 NOTE — Patient Instructions (Signed)
 Good to see you today- I will be in touch with your labs Flu shot and tetanus today I do recommend a pneumonia vaccine and shingles series as well  Referral to [facility] If you don't hear from them please do call [facility] [address] [place], [place] [address]  Call [phone]  Fax [phone]

## 2024-10-19 ENCOUNTER — Encounter: Payer: Self-pay | Admitting: Family Medicine

## 2024-10-19 DIAGNOSIS — R1115 Cyclical vomiting syndrome unrelated to migraine: Secondary | ICD-10-CM
